# Patient Record
Sex: Female | Born: 1990 | Race: Black or African American | Hispanic: No | Marital: Single | Smoking: Former smoker
Health system: Southern US, Community
[De-identification: ages and names within clinical notes are randomized; demographics above are authoritative.]

## PROBLEM LIST (undated history)

## (undated) ENCOUNTER — Inpatient Hospital Stay (HOSPITAL_COMMUNITY): Payer: Self-pay

## (undated) HISTORY — PX: APPENDECTOMY: SHX54

## (undated) HISTORY — PX: WISDOM TOOTH EXTRACTION: SHX21

---
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
## 2014-08-26 ENCOUNTER — Emergency Department (HOSPITAL_COMMUNITY)
Admission: EM | Admit: 2014-08-26 | Discharge: 2014-08-26 | Disposition: A | Discharge status: Home / Self Care | Payer: Medicaid Other | Attending: Emergency Medicine | Admitting: Emergency Medicine

## 2014-08-26 ENCOUNTER — Encounter (HOSPITAL_COMMUNITY): Payer: Self-pay | Admitting: *Deleted

## 2014-08-26 DIAGNOSIS — S7012XA Contusion of left thigh, initial encounter: Secondary | ICD-10-CM

## 2014-08-26 DIAGNOSIS — Z720 Tobacco use: Secondary | ICD-10-CM | POA: Insufficient documentation

## 2014-08-26 DIAGNOSIS — Y92410 Unspecified street and highway as the place of occurrence of the external cause: Secondary | ICD-10-CM | POA: Insufficient documentation

## 2014-08-26 DIAGNOSIS — S80812A Abrasion, left lower leg, initial encounter: Secondary | ICD-10-CM | POA: Insufficient documentation

## 2014-08-26 DIAGNOSIS — Y9389 Activity, other specified: Secondary | ICD-10-CM | POA: Insufficient documentation

## 2014-08-26 DIAGNOSIS — Y998 Other external cause status: Secondary | ICD-10-CM | POA: Insufficient documentation

## 2014-08-26 DIAGNOSIS — S3992XA Unspecified injury of lower back, initial encounter: Secondary | ICD-10-CM | POA: Insufficient documentation

## 2014-08-26 MED ORDER — IBUPROFEN 800 MG PO TABS: 800.0000 mg | ORAL_TABLET | Freq: Three times a day (TID) | ORAL | Status: DC

## 2014-08-26 MED ORDER — MUPIROCIN CALCIUM 2 % EX CREA
TOPICAL_CREAM | Freq: Once | CUTANEOUS | Status: DC
  Filled 2014-08-26: qty 15

## 2014-08-26 MED ORDER — CYCLOBENZAPRINE HCL 10 MG PO TABS: 10.0000 mg | ORAL_TABLET | Freq: Two times a day (BID) | ORAL | Status: DC | PRN

## 2014-08-26 MED ORDER — BACITRACIN ZINC 500 UNIT/GM EX OINT
TOPICAL_OINTMENT | CUTANEOUS | Status: DC
Start: 2014-08-26 — End: 2014-08-27
  Filled 2014-08-26: qty 0.9
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
## 2015-08-23 ENCOUNTER — Encounter (HOSPITAL_COMMUNITY): Payer: Self-pay | Admitting: Emergency Medicine

## 2015-08-23 ENCOUNTER — Emergency Department (HOSPITAL_COMMUNITY)
Admission: EM | Admit: 2015-08-23 | Discharge: 2015-08-23 | Discharge status: Against Medical Advice | Payer: Medicaid Other | Attending: Emergency Medicine | Admitting: Emergency Medicine

## 2015-08-23 DIAGNOSIS — R55 Syncope and collapse: Secondary | ICD-10-CM | POA: Insufficient documentation

## 2015-08-23 DIAGNOSIS — R51 Headache: Secondary | ICD-10-CM | POA: Insufficient documentation

## 2015-08-23 DIAGNOSIS — F17210 Nicotine dependence, cigarettes, uncomplicated: Secondary | ICD-10-CM | POA: Insufficient documentation

## 2015-08-23 DIAGNOSIS — R110 Nausea: Secondary | ICD-10-CM | POA: Insufficient documentation

## 2015-08-23 LAB — CBC
HCT: 39.8 % (ref 36.0–46.0)
HEMOGLOBIN: 12.9 g/dL (ref 12.0–15.0)
MCH: 30.9 pg (ref 26.0–34.0)
MCHC: 32.4 g/dL (ref 30.0–36.0)
MCV: 95.4 fL (ref 78.0–100.0)
PLATELETS: 236 10*3/uL (ref 150–400)
RBC: 4.17 MIL/uL (ref 3.87–5.11)
RDW: 14.2 % (ref 11.5–15.5)
WBC: 10.1 10*3/uL (ref 4.0–10.5)

## 2015-08-23 LAB — BASIC METABOLIC PANEL
ANION GAP: 8 (ref 5–15)
BUN: 8 mg/dL (ref 6–20)
CALCIUM: 9.5 mg/dL (ref 8.9–10.3)
CHLORIDE: 107 mmol/L (ref 101–111)
CO2: 25 mmol/L (ref 22–32)
CREATININE: 0.8 mg/dL (ref 0.44–1.00)
GFR calc non Af Amer: >60 mL/min (ref >60)
Glucose, Bld: 90 mg/dL (ref 65–99)
Potassium: 3.5 mmol/L (ref 3.5–5.1)
SODIUM: 140 mmol/L (ref 135–145)

## 2015-08-23 LAB — CBG MONITORING, ED: GLUCOSE-CAPILLARY: 71 mg/dL (ref 65–99)

## 2015-08-23 NOTE — ED Notes (Signed)
Saturday night she was doing her hair when she fainted. States she hit her head on the patio, friend states she was passed out for around 1 minute. States she fainted again not too long afterwards. Has felt nauseous since. Today began having a bad headache, and states she feels weird like she did when she fainted over the weekend.

## 2015-08-23 NOTE — ED Notes (Signed)
Patient states to this writer she is leaving and will come back tomorrow.

## 2015-11-09 ENCOUNTER — Encounter (HOSPITAL_COMMUNITY): Payer: Self-pay | Admitting: Emergency Medicine

## 2015-11-09 DIAGNOSIS — R5383 Other fatigue: Secondary | ICD-10-CM | POA: Insufficient documentation

## 2015-11-09 DIAGNOSIS — F17210 Nicotine dependence, cigarettes, uncomplicated: Secondary | ICD-10-CM | POA: Insufficient documentation

## 2015-11-09 DIAGNOSIS — R112 Nausea with vomiting, unspecified: Secondary | ICD-10-CM | POA: Insufficient documentation

## 2015-11-09 NOTE — ED Notes (Signed)
Pt. reports nausea and emesis with fatigue onset this evening , denies fever or chills.

## 2015-11-10 ENCOUNTER — Emergency Department (HOSPITAL_BASED_OUTPATIENT_CLINIC_OR_DEPARTMENT_OTHER)
Admission: EM | Admit: 2015-11-10 | Discharge: 2015-11-10 | Disposition: A | Discharge status: Home / Self Care | Payer: Self-pay | Attending: Emergency Medicine | Admitting: Emergency Medicine

## 2015-11-10 ENCOUNTER — Emergency Department (HOSPITAL_BASED_OUTPATIENT_CLINIC_OR_DEPARTMENT_OTHER): Payer: Self-pay

## 2015-11-10 ENCOUNTER — Emergency Department (HOSPITAL_COMMUNITY)
Admission: EM | Admit: 2015-11-10 | Discharge: 2015-11-10 | Disposition: A | Discharge status: Home / Self Care | Payer: No Typology Code available for payment source | Attending: Emergency Medicine | Admitting: Emergency Medicine

## 2015-11-10 ENCOUNTER — Encounter (HOSPITAL_BASED_OUTPATIENT_CLINIC_OR_DEPARTMENT_OTHER): Payer: Self-pay | Admitting: *Deleted

## 2015-11-10 DIAGNOSIS — R1010 Upper abdominal pain, unspecified: Secondary | ICD-10-CM | POA: Insufficient documentation

## 2015-11-10 DIAGNOSIS — R1110 Vomiting, unspecified: Secondary | ICD-10-CM | POA: Insufficient documentation

## 2015-11-10 DIAGNOSIS — F17210 Nicotine dependence, cigarettes, uncomplicated: Secondary | ICD-10-CM | POA: Insufficient documentation

## 2015-11-10 LAB — COMPREHENSIVE METABOLIC PANEL
ALBUMIN: 4.1 g/dL (ref 3.5–5.0)
ALT: 13 U/L — ABNORMAL LOW (ref 14–54)
ANION GAP: 11 (ref 5–15)
AST: 25 U/L (ref 15–41)
Alkaline Phosphatase: 50 U/L (ref 38–126)
BUN: 7 mg/dL (ref 6–20)
CHLORIDE: 105 mmol/L (ref 101–111)
CO2: 25 mmol/L (ref 22–32)
Calcium: 9.6 mg/dL (ref 8.9–10.3)
Creatinine, Ser: 0.87 mg/dL (ref 0.44–1.00)
GFR calc Af Amer: >60 mL/min (ref >60)
GFR calc non Af Amer: >60 mL/min (ref >60)
GLUCOSE: 110 mg/dL — AB (ref 65–99)
POTASSIUM: 4.3 mmol/L (ref 3.5–5.1)
SODIUM: 141 mmol/L (ref 135–145)
Total Bilirubin: 1.1 mg/dL (ref 0.3–1.2)
Total Protein: 8.3 g/dL — ABNORMAL HIGH (ref 6.5–8.1)

## 2015-11-10 LAB — URINE MICROSCOPIC-ADD ON

## 2015-11-10 LAB — URINALYSIS, ROUTINE W REFLEX MICROSCOPIC
GLUCOSE, UA: NEGATIVE mg/dL
KETONES UR: 15 mg/dL — AB
NITRITE: NEGATIVE
PH: 5 (ref 5.0–8.0)
Protein, ur: 100 mg/dL — AB
SPECIFIC GRAVITY, URINE: 1.03 (ref 1.005–1.030)

## 2015-11-10 LAB — CBC
HEMATOCRIT: 43.7 % (ref 36.0–46.0)
HEMOGLOBIN: 13.9 g/dL (ref 12.0–15.0)
MCH: 30.6 pg (ref 26.0–34.0)
MCHC: 31.8 g/dL (ref 30.0–36.0)
MCV: 96.3 fL (ref 78.0–100.0)
Platelets: 236 10*3/uL (ref 150–400)
RBC: 4.54 MIL/uL (ref 3.87–5.11)
RDW: 14.3 % (ref 11.5–15.5)
WBC: 17.5 10*3/uL — ABNORMAL HIGH (ref 4.0–10.5)

## 2015-11-10 LAB — POC URINE PREG, ED: Preg Test, Ur: NEGATIVE

## 2015-11-10 LAB — LIPASE, BLOOD: Lipase: 12 U/L (ref 11–51)

## 2015-11-10 MED ORDER — METOCLOPRAMIDE HCL 10 MG PO TABS: 10.0000 mg | ORAL_TABLET | Freq: Four times a day (QID) | ORAL | Status: DC

## 2015-11-10 MED ORDER — ONDANSETRON 4 MG PO TBDP
4.0000 mg | ORAL_TABLET | Freq: Once | ORAL | Status: AC | PRN
  Administered 2015-11-10: 4 mg via ORAL

## 2015-11-10 MED ORDER — ONDANSETRON 4 MG PO TBDP
ORAL_TABLET | ORAL | Status: AC
  Filled 2015-11-10: qty 1

## 2015-11-10 MED ORDER — MORPHINE SULFATE (PF) 2 MG/ML IV SOLN
2.0000 mg | Freq: Once | INTRAVENOUS | Status: AC
  Administered 2015-11-10: 2 mg via INTRAVENOUS
  Filled 2015-11-10: qty 1

## 2015-11-10 MED ORDER — SODIUM CHLORIDE 0.9 % IV BOLUS (SEPSIS)
2000.0000 mL | Freq: Once | INTRAVENOUS | Status: AC
  Administered 2015-11-10: 2000 mL via INTRAVENOUS

## 2015-11-10 NOTE — ED Provider Notes (Signed)
CSN: [id number]     Arrival date & time 11/10/15  [id number] History   None    Chief Complaint  Patient presents with  . Abdominal Pain     (Consider location/radiation/quality/duration/timing/severity/associated sxs/prior Treatment) HPI Complains of epigastric pain onset 7:30 PM yesterday immediately after eating at [employer] accompanied by multiple episodes of vomiting. She was seen at [facility] emergency department last night, treated with antiemetics and released. Continues to complain of pain this morning though she is no longer nauseated. Denies hematemesis No fever. No other associated symptoms. Last bowel movement yesterday, normal. No urinary symptoms no vaginal discharge. Nothing makes pain better or worse. No nausea at present. History reviewed. No pertinent past medical history. Past medical history negative History reviewed. No pertinent past surgical history. History reviewed. No pertinent family history. Social History  Substance Use Topics  . Smoking status: Current Every Day Smoker -- 0.20 packs/day    Types: Cigarettes  . Smokeless tobacco: None  . Alcohol Use: Yes  Positive marijuana use OB History    No data available     Review of Systems  Constitutional: Negative.   HENT: Negative.   Respiratory: Negative.   Cardiovascular: Negative.   Gastrointestinal: Positive for vomiting and abdominal pain.  Musculoskeletal: Negative.   Skin: Negative.   Neurological: Negative.   Psychiatric/Behavioral: Negative.   All other systems reviewed and are negative.     Allergies  Tylenol  Home Medications   Prior to Admission medications   Medication Sig Start Date End Date Taking? Authorizing Provider  CAFFEINE CITRATE PO Take by mouth.    Historical Provider, MD  ibuprofen (ADVIL,MOTRIN) 200 MG tablet Take 200 mg by mouth every 6 (six) hours as needed.    Historical Provider, MD   BP 119/77 mmHg  Pulse 88  Temp(Src) 100.2 F (37.9 C) (Oral)  Resp 18  Ht  5\' 6"  (1.676 m)  Wt 113 lb (51.256 kg)  BMI 18.25 kg/m2  SpO2 98%  LMP 11/02/2015 (Approximate) Physical Exam  Constitutional: She appears well-developed and well-nourished.  HENT:  Head: Normocephalic and atraumatic.  Eyes: Conjunctivae are normal. Pupils are equal, round, and reactive to light.  Neck: Neck supple. No tracheal deviation present. No thyromegaly present.  Cardiovascular: Normal rate and regular rhythm.   No murmur heard. Pulmonary/Chest: Effort normal and breath sounds normal.  Abdominal: Soft. Bowel sounds are normal. She exhibits no distension and no mass. There is tenderness. There is no rebound and no guarding.  Tender at epigastrium and right upper quadrant  Musculoskeletal: Normal range of motion. She exhibits no edema or tenderness.  Neurological: She is alert. Coordination normal.  Skin: Skin is warm and dry. No rash noted.  Psychiatric: She has a normal mood and affect.  Nursing note and vitals reviewed.   ED Course  Procedures (including critical care time) Labs Review Labs Reviewed - No data to display  Imaging Review No results found. I have personally reviewed and evaluated these images and lab results as part of my medical decision-making.   EKG Interpretation None    9:40 AM patient asymptomatic after treatment with intravenous morphine, 2 mg and intravenous fluids administered. On reexamination abdomen is nontender. She's able to drink water without nausea or vomiting  Laboratory work from yesterday's ED visit reviewed  MDM  Plan clear liquid diet for the next 24 hours. Return if symptoms worsen. Prescription Reglan. Referral [facility] to get primary care physician. Abdominal pain is nonspecific at  present.  Final diagnoses:  None  Diagnosis #1 upper abdominal pain #2 nausea and vomiting      Orlie Dakin, MD 11/10/15 [phone]

## 2015-11-10 NOTE — ED Notes (Signed)
MD at bedside. 

## 2015-11-10 NOTE — ED Notes (Signed)
Patient states she does have someone to pick her up and she would like something for pain. MD updated.

## 2015-11-10 NOTE — ED Notes (Signed)
Patient is alert and oriented x3.  She was given DC instructions and follow up visit instructions.  Patient gave verbal understanding. She was DC ambulatory under her own power to home.  V/S stable.  He was not showing any signs of distress on DC 

## 2015-11-10 NOTE — ED Notes (Signed)
Patient arrived from home.  She is complaining of abdominal pain that started last night. She states that about 7:30pm after eating [employer] she started to feel nausea and vomiting With diarrhea.  Patient was seen at [facility] last night and states that she does not feel any better. Currently rates her pain 10 of 10.  No active vomiting noted

## 2015-11-10 NOTE — ED Notes (Signed)
Patient transported to ultrasound.

## 2015-11-10 NOTE — ED Notes (Signed)
Patient returned from US.

## 2015-11-10 NOTE — Discharge Instructions (Signed)
Abdominal Pain, Adult Stay on clear liquids such as apple juice, Jell-O, water or Gatorade or clear broth for the next 24 hours. Take the medication prescribed as needed for nausea or vomiting. Make sure that you drink at least six 8 ounce glasses of clear liquids each day to prevent dehydration. Return if you feel worse or are unable to hold down fluids after taking the medication prescribed. Call the [facility] to get a primary care physician. Many things can cause abdominal pain. Usually, abdominal pain is not caused by a disease and will improve without treatment. It can often be observed and treated at home. Your health care provider will do a physical exam and possibly order blood tests and X-rays to help determine the seriousness of your pain. However, in many cases, more time must pass before a clear cause of the pain can be found. Before that point, your health care provider may not know if you need more testing or further treatment. HOME CARE INSTRUCTIONS Monitor your abdominal pain for any changes. The following actions may help to alleviate any discomfort you are experiencing:  Only take over-the-counter or prescription medicines as directed by your health care provider.  Do not take laxatives unless directed to do so by your health care provider.  Try a clear liquid diet (broth, tea, or water) as directed by your health care provider. Slowly move to a bland diet as tolerated. SEEK MEDICAL CARE IF:  You have unexplained abdominal pain.  You have abdominal pain associated with nausea or diarrhea.  You have pain when you urinate or have a bowel movement.  You experience abdominal pain that wakes you in the night.  You have abdominal pain that is worsened or improved by eating food.  You have abdominal pain that is worsened with eating fatty foods.  You have a fever. SEEK IMMEDIATE MEDICAL CARE IF:  Your pain does not go away within 2 hours.  You  keep throwing up (vomiting).  Your pain is felt only in portions of the abdomen, such as the right side or the left lower portion of the abdomen.  You pass bloody or black tarry stools. MAKE SURE YOU:  Understand these instructions.  Will watch your condition.  Will get help right away if you are not doing well or get worse.   This information is not intended to replace advice given to you by your health care provider. Make sure you discuss any questions you have with your health care provider.   Document Released: 05/02/2005 Document Revised: 04/13/2015 Document Reviewed: 04/01/2013 Elsevier Interactive Patient Education [employer].

## 2016-07-17 ENCOUNTER — Inpatient Hospital Stay (HOSPITAL_COMMUNITY): Payer: Medicaid Other

## 2016-07-17 ENCOUNTER — Inpatient Hospital Stay (HOSPITAL_COMMUNITY)
Admission: AD | Admit: 2016-07-17 | Discharge: 2016-07-17 | Disposition: A | Discharge status: Home / Self Care | Payer: Medicaid Other | Source: Ambulatory Visit | Attending: Obstetrics and Gynecology | Admitting: Obstetrics and Gynecology

## 2016-07-17 ENCOUNTER — Encounter (HOSPITAL_COMMUNITY): Payer: Self-pay | Admitting: *Deleted

## 2016-07-17 DIAGNOSIS — R109 Unspecified abdominal pain: Secondary | ICD-10-CM | POA: Diagnosis not present

## 2016-07-17 DIAGNOSIS — Z87891 Personal history of nicotine dependence: Secondary | ICD-10-CM | POA: Diagnosis not present

## 2016-07-17 DIAGNOSIS — O26891 Other specified pregnancy related conditions, first trimester: Secondary | ICD-10-CM

## 2016-07-17 DIAGNOSIS — Z3A09 9 weeks gestation of pregnancy: Secondary | ICD-10-CM | POA: Insufficient documentation

## 2016-07-17 DIAGNOSIS — O219 Vomiting of pregnancy, unspecified: Secondary | ICD-10-CM

## 2016-07-17 DIAGNOSIS — O26899 Other specified pregnancy related conditions, unspecified trimester: Secondary | ICD-10-CM

## 2016-07-17 DIAGNOSIS — O210 Mild hyperemesis gravidarum: Secondary | ICD-10-CM | POA: Insufficient documentation

## 2016-07-17 DIAGNOSIS — Z3A01 Less than 8 weeks gestation of pregnancy: Secondary | ICD-10-CM

## 2016-07-17 DIAGNOSIS — Z3491 Encounter for supervision of normal pregnancy, unspecified, first trimester: Secondary | ICD-10-CM

## 2016-07-17 LAB — WET PREP, GENITAL
Sperm: NONE SEEN
TRICH WET PREP: NONE SEEN
Yeast Wet Prep HPF POC: NONE SEEN

## 2016-07-17 LAB — COMPREHENSIVE METABOLIC PANEL
ALT: 9 U/L — ABNORMAL LOW (ref 14–54)
AST: 15 U/L (ref 15–41)
Albumin: 3.8 g/dL (ref 3.5–5.0)
Alkaline Phosphatase: 46 U/L (ref 38–126)
Anion gap: 6 (ref 5–15)
BUN: 7 mg/dL (ref 6–20)
CO2: 24 mmol/L (ref 22–32)
Calcium: 9 mg/dL (ref 8.9–10.3)
Chloride: 101 mmol/L (ref 101–111)
Creatinine, Ser: 0.68 mg/dL (ref 0.44–1.00)
GFR calc Af Amer: >60 mL/min (ref >60)
GFR calc non Af Amer: >60 mL/min (ref >60)
Glucose, Bld: 92 mg/dL (ref 65–99)
Potassium: 3.9 mmol/L (ref 3.5–5.1)
Sodium: 131 mmol/L — ABNORMAL LOW (ref 135–145)
Total Bilirubin: 0.5 mg/dL (ref 0.3–1.2)
Total Protein: 7.3 g/dL (ref 6.5–8.1)

## 2016-07-17 LAB — CBC
HEMATOCRIT: 35.9 % — AB (ref 36.0–46.0)
HEMOGLOBIN: 12.2 g/dL (ref 12.0–15.0)
MCH: 31 pg (ref 26.0–34.0)
MCHC: 34 g/dL (ref 30.0–36.0)
MCV: 91.3 fL (ref 78.0–100.0)
Platelets: 245 10*3/uL (ref 150–400)
RBC: 3.93 MIL/uL (ref 3.87–5.11)
RDW: 14.3 % (ref 11.5–15.5)
WBC: 10.2 10*3/uL (ref 4.0–10.5)

## 2016-07-17 LAB — HCG, QUANTITATIVE, PREGNANCY: hCG, Beta Chain, Quant, S: 122310 m[IU]/mL — ABNORMAL HIGH (ref <5)

## 2016-07-17 LAB — URINALYSIS, ROUTINE W REFLEX MICROSCOPIC
Bilirubin Urine: NEGATIVE
GLUCOSE, UA: NEGATIVE mg/dL
Hgb urine dipstick: NEGATIVE
Ketones, ur: NEGATIVE mg/dL
LEUKOCYTES UA: NEGATIVE
Nitrite: NEGATIVE
PH: 5 (ref 5.0–8.0)
Protein, ur: NEGATIVE mg/dL
SPECIFIC GRAVITY, URINE: 1.024 (ref 1.005–1.030)

## 2016-07-17 LAB — ABO/RH: ABO/RH(D): B POS

## 2016-07-17 LAB — POCT PREGNANCY, URINE: Preg Test, Ur: POSITIVE — AB

## 2016-07-17 MED ORDER — METOCLOPRAMIDE HCL 10 MG PO TABS: 10.0000 mg | ORAL_TABLET | Freq: Four times a day (QID) | ORAL | 0 refills | Status: DC | PRN

## 2016-07-17 MED ORDER — METOCLOPRAMIDE HCL 10 MG PO TABS
10.0000 mg | ORAL_TABLET | Freq: Three times a day (TID) | ORAL | Status: DC | PRN
  Administered 2016-07-17: 10 mg via ORAL
  Filled 2016-07-17: qty 1

## 2016-07-17 NOTE — MAU Note (Signed)
Urine in lab 

## 2016-07-17 NOTE — MAU Provider Note (Signed)
History     CSN: [id number]  Arrival date and time: 07/17/16 0915   None     Chief Complaint  Patient presents with  . Abdominal Pain  . Nausea  . Possible Pregnancy   G2P0010 @[redacted]w[redacted]d  here with c/o intermittent N/V x2 weeks. She reports nausea daily and vomiting occasionally. She also c/o intermittent sharp pain in lower abdomen x2 weeks. She used Ibuprofen but unsure if she had relief since she was nauseated and vomiting at the time. She reports a thin white non-odorous vaginal discharge x2 weeks. No new partner in a year. She denies fevers. She denies urinary sx. Denies sick contacts.   OB History    Gravida Para Term Preterm AB Living   2       1 0   SAB TAB Ectopic Multiple Live Births     1     0      Past Medical History:  Diagnosis Date  . Medical history non-contributory     Past Surgical History:  Procedure Laterality Date  . WISDOM TOOTH EXTRACTION      History reviewed. No pertinent family history.  Social History  Substance Use Topics  . Smoking status: Former Smoker    Packs/day: 0.20    Types: Cigarettes, Cigars    Quit date: 12/16/2015  . Smokeless tobacco: Never Used  . Alcohol use No    Allergies: No Known Allergies  Prescriptions Prior to Admission  Medication Sig Dispense Refill Last Dose  . CAFFEINE CITRATE PO Take by mouth.   Unknown at Unknown time  . ibuprofen (ADVIL,MOTRIN) 200 MG tablet Take 200 mg by mouth every 6 (six) hours as needed.   Past Month at Unknown time  . metoCLOPramide (REGLAN) 10 MG tablet Take 1 tablet (10 mg total) by mouth every 6 (six) hours. 6 tablet 0     Review of Systems  Constitutional: Negative.   Gastrointestinal: Positive for abdominal pain, nausea and vomiting.  Genitourinary: Negative.    Physical Exam   Blood pressure 98/70, pulse 79, temperature 98.3 F (36.8 C), temperature source Oral, resp. rate 16, weight 53.1 kg (117 lb), last menstrual period 05/10/2016.  Physical Exam  Constitutional:  She is oriented to person, place, and time. She appears well-developed and well-nourished. No distress.  HENT:  Head: Normocephalic and atraumatic.  Neck: Normal range of motion. Neck supple.  Cardiovascular: Normal rate.   Respiratory: Effort normal.  GI: Soft. She exhibits no distension and no mass. There is no tenderness. There is no rebound and no guarding.  Genitourinary:  Genitourinary Comments: External: no lesions or erythema Vagina: rugated, nulli, white, thin, scant discharge Uterus: non enlarged, retroverted, non tender, no CMT Adnexae: no masses, no tenderness left, no tenderness right   Musculoskeletal: Normal range of motion.  Neurological: She is alert and oriented to person, place, and time.  Skin: Skin is warm and dry.   Results for orders placed or performed during the hospital encounter of 07/17/16 (from the past 24 hour(s))  Pregnancy, urine POC     Status: Abnormal   Collection Time: 07/17/16  9:53 AM  Result Value Ref Range   Preg Test, Ur POSITIVE (A) NEGATIVE  Urinalysis, Routine w reflex microscopic     Status: Abnormal   Collection Time: 07/17/16  9:54 AM  Result Value Ref Range   Color, Urine YELLOW YELLOW   APPearance HAZY (A) CLEAR   Specific Gravity, Urine 1.024 1.005 - 1.030   pH 5.0 5.0 -  8.0   Glucose, UA NEGATIVE NEGATIVE mg/dL   Hgb urine dipstick NEGATIVE NEGATIVE   Bilirubin Urine NEGATIVE NEGATIVE   Ketones, ur NEGATIVE NEGATIVE mg/dL   Protein, ur NEGATIVE NEGATIVE mg/dL   Nitrite NEGATIVE NEGATIVE   Leukocytes, UA NEGATIVE NEGATIVE  Wet prep, genital     Status: Abnormal   Collection Time: 07/17/16 12:31 PM  Result Value Ref Range   Yeast Wet Prep HPF POC NONE SEEN NONE SEEN   Trich, Wet Prep NONE SEEN NONE SEEN   Clue Cells Wet Prep HPF POC PRESENT (A) NONE SEEN   WBC, Wet Prep HPF POC FEW (A) NONE SEEN   Sperm NONE SEEN   CBC     Status: Abnormal   Collection Time: 07/17/16 12:35 PM  Result Value Ref Range   WBC 10.2 4.0 - 10.5  K/uL   RBC 3.93 3.87 - 5.11 MIL/uL   Hemoglobin 12.2 12.0 - 15.0 g/dL   HCT 35.9 (L) 36.0 - 46.0 %   MCV 91.3 78.0 - 100.0 fL   MCH 31.0 26.0 - 34.0 pg   MCHC 34.0 30.0 - 36.0 g/dL   RDW 14.3 11.5 - 15.5 %   Platelets 245 150 - 400 K/uL  Comprehensive metabolic panel     Status: Abnormal   Collection Time: 07/17/16 12:35 PM  Result Value Ref Range   Sodium 131 (L) 135 - 145 mmol/L   Potassium 3.9 3.5 - 5.1 mmol/L   Chloride 101 101 - 111 mmol/L   CO2 24 22 - 32 mmol/L   Glucose, Bld 92 65 - 99 mg/dL   BUN 7 6 - 20 mg/dL   Creatinine, Ser 0.68 0.44 - 1.00 mg/dL   Calcium 9.0 8.9 - 10.3 mg/dL   Total Protein 7.3 6.5 - 8.1 g/dL   Albumin 3.8 3.5 - 5.0 g/dL   AST 15 15 - 41 U/L   ALT 9 (L) 14 - 54 U/L   Alkaline Phosphatase 46 38 - 126 U/L   Total Bilirubin 0.5 0.3 - 1.2 mg/dL   GFR calc non Af Amer >60 >60 mL/min   GFR calc Af Amer >60 >60 mL/min   Anion gap 6 5 - 15  hCG, quantitative, pregnancy     Status: Abnormal   Collection Time: 07/17/16 12:35 PM  Result Value Ref Range   hCG, Beta Chain, Quant, S 122,310 (H) <5 mIU/mL  ABO/Rh     Status: None (Preliminary result)   Collection Time: 07/17/16 12:35 PM  Result Value Ref Range   ABO/RH(D) B POS     MAU Course  Procedures Reglan 10 mg po  MDM Labs and [place] ordered and reviewed. No episodes of emesis. No evidence of acute pelvic or abdominal process. Normal SIUP at [redacted]w[redacted]d not consistent with LMP. Stable for discharge home.  Assessment and Plan   1. Nausea/vomiting in pregnancy   2. Abdominal pain affecting pregnancy   3. Normal intrauterine pregnancy on prenatal ultrasound in first trimester    Discharge home Follow up in 2-3 weeks with OBGYN provider of choice Return for worsening sx    Medication List    STOP taking these medications   CAFFEINE CITRATE PO   ibuprofen 200 MG tablet Commonly known as:  ADVIL,MOTRIN     TAKE these medications   metoCLOPramide 10 MG tablet Commonly known as:  REGLAN Take  1 tablet (10 mg total) by mouth every 6 (six) hours as needed for nausea. What changed:  when to take  this  reasons to take this      Julianne Handler, CNM 07/17/2016, 12:24 PM

## 2016-07-17 NOTE — MAU Note (Signed)
Pt had gingerale with her that she was sipping on .  Did not throw up while waiting in lobby

## 2016-07-17 NOTE — MAU Note (Signed)
Last few weeks has been feeling sick, has been nauseous.  Some vomiting.  Stools have been loose. Stomach cramps, sharp pains.  One neg and one pos preg test, did them last week. No period in Nov. ( has been eating ribs, pizza, sausage, hot sauce.Marland KitchenMarland KitchenMarland Kitchen)

## 2016-07-17 NOTE — Discharge Instructions (Signed)

## 2016-07-18 LAB — GC/CHLAMYDIA PROBE AMP (~~LOC~~) NOT AT ARMC
CHLAMYDIA, DNA PROBE: NEGATIVE
Neisseria Gonorrhea: NEGATIVE

## 2016-08-06 NOTE — L&D Delivery Note (Signed)
Patient is a 26 y/o G2P1011 at [redacted]w[redacted]d here for SOL, SROM@1450  with moderate meconium  Delivery Note At 3:07 PM a viable female was delivered via Vaginal, Spontaneous Delivery (Presentation: OA).  APGAR: 9, 9; weight 7 lb 7.1 oz (3375 g).   Placenta status: Intact  Cord: 3v with the following complications: postpartum hemorrhage.  Cord pH: Not obtained  Patient arrived to floor with a complete cervix, ruptured with moderate meconium. Patient pushed for approximated 10 minutes before delivering a viable female infant that was crying and vigorous.  10U of IM pitocin was given following delivery of the neonate. The placenta was delivered with gentle traction, intact, after which significant bleeding was appreciated.  The patient's fundus was boggy and she was given 1037mcg of cytotec per rectum and 0.2mg  methergine IM along with fundal massage while IV access was established.  The patient was given a bolus of pitocin through IV and was noted to be firm.  A second degree laceration in the posterior perineum was repaired using 1% lidocaine with 3.0 vicryl and noted to be hemostatic.  Anesthesia: None for delivery. 1% lidocaine for laceration repair. Episiotomy:  None Lacerations:  Second degree, posterior Suture Repair: 3.0 vicryl Est. Blood Loss (mL): 700  Mom to postpartum.  Baby to Couplet care / Skin to Skin.  Fransisca Kaufmann Christian 02/25/2017, 4:09 PM  OB FELLOW DELIVERY ATTESTATION  I was gloved and present for the delivery.. Dr. Kennon Rounds was present and assisted with laceration repair. I agree with the above resident's note.    Gailen Shelter, MD OB Fellow 8:30 PM

## 2016-08-14 ENCOUNTER — Encounter: Payer: Self-pay | Admitting: Medical

## 2016-08-14 ENCOUNTER — Ambulatory Visit (INDEPENDENT_AMBULATORY_CARE_PROVIDER_SITE_OTHER): Payer: Self-pay | Admitting: Medical

## 2016-08-14 DIAGNOSIS — Z3401 Encounter for supervision of normal first pregnancy, first trimester: Secondary | ICD-10-CM

## 2016-08-14 DIAGNOSIS — Z3403 Encounter for supervision of normal first pregnancy, third trimester: Secondary | ICD-10-CM | POA: Insufficient documentation

## 2016-08-14 NOTE — Patient Instructions (Signed)

## 2016-08-14 NOTE — Progress Notes (Signed)
Decline Flu

## 2016-08-14 NOTE — Progress Notes (Signed)
   PRENATAL VISIT NOTE  Subjective:  Tricia Campbell is a 26 y.o. G2P0010 at [redacted]w[redacted]d being seen today for her initial prenatal care visit.  She is currently monitored for the following issues for this low-risk pregnancy and has Supervision of normal pregnancy in first trimester on her problem list.  Patient reports no complaints.   . Vag. Bleeding: None.  Movement: Absent. Denies leaking of fluid.   The following portions of the patient's history were reviewed and updated as appropriate: allergies, current medications, past family history, past medical history, past social history, past surgical history and problem list. Problem list updated.  Objective:   Vitals:   08/14/16 0919  BP: 109/68  Pulse: 62  Weight: 112 lb 8 oz (51 kg)    Fetal Status: Fetal Heart Rate (bpm): 172   Movement: Absent     General:  Alert, oriented and cooperative. Patient is in no acute distress.  Skin: Skin is warm and dry. No rash noted.   Cardiovascular: Normal heart rate noted  Respiratory: Normal respiratory effort, no problems with respiration noted  Abdomen: Soft, gravid, appropriate for gestational age. Pain/Pressure: Absent     Pelvic:  Cervical exam deferred        Extremities: Normal range of motion.     Mental Status: Normal mood and affect. Normal behavior. Normal judgment and thought content.   Assessment and Plan:  Pregnancy: G2P0010 at [redacted]w[redacted]d  1. Encounter for supervision of normal first pregnancy in first trimester - Patient no longer experiencing N/V and no longer requiring anti-emetics at this time - Prenatal Profile - Pain Mgmt, Profile 6 Conf w/o mM, U - Culture, OB Urine  first trimester warning signs and general obstetric precautions including but not limited to vaginal bleeding, contractions, leaking of fluid and fetal movement were reviewed in detail with the patient. Please refer to After Visit Summary for other counseling recommendations.  Return in about 4 weeks  (around 09/11/2016) for LOB.   Luvenia Redden, PA-C

## 2016-08-15 LAB — PRENATAL PROFILE (SOLSTAS)
Antibody Screen: NEGATIVE
BASOS PCT: 0 %
Basophils Absolute: 0 cells/uL (ref 0–200)
EOS ABS: 96 {cells}/uL (ref 15–500)
Eosinophils Relative: 1 %
HCT: 38.4 % (ref 35.0–45.0)
HEP B S AG: NEGATIVE
HIV: NONREACTIVE
Hemoglobin: 12.5 g/dL (ref 11.7–15.5)
LYMPHS ABS: 1728 {cells}/uL (ref 850–3900)
LYMPHS PCT: 18 %
MCH: 30.9 pg (ref 27.0–33.0)
MCHC: 32.6 g/dL (ref 32.0–36.0)
MCV: 95 fL (ref 80.0–100.0)
MPV: 10.7 fL (ref 7.5–12.5)
Monocytes Absolute: 672 cells/uL (ref 200–950)
Monocytes Relative: 7 %
NEUTROS PCT: 74 %
Neutro Abs: 7104 cells/uL (ref 1500–7800)
PLATELETS: 265 10*3/uL (ref 140–400)
RBC: 4.04 MIL/uL (ref 3.80–5.10)
RDW: 14.5 % (ref 11.0–15.0)
RH TYPE: POSITIVE
Rubella: 3.66 Index — ABNORMAL HIGH (ref <0.90)
WBC: 9.6 10*3/uL (ref 3.8–10.8)

## 2016-08-15 LAB — CULTURE, OB URINE: Organism ID, Bacteria: NO GROWTH

## 2016-08-16 LAB — PAIN MGMT, PROFILE 6 CONF W/O MM, U
6 Acetylmorphine: NEGATIVE ng/mL (ref <10)
ALCOHOL METABOLITES: NEGATIVE ng/mL (ref <500)
Amphetamines: NEGATIVE ng/mL (ref <500)
BARBITURATES: NEGATIVE ng/mL (ref <300)
BENZODIAZEPINES: NEGATIVE ng/mL (ref <100)
COCAINE METABOLITE: NEGATIVE ng/mL (ref <150)
CREATININE: 125.5 mg/dL (ref >=20.0)
MARIJUANA METABOLITE: POSITIVE ng/mL — AB (ref <20)
METHADONE METABOLITE: NEGATIVE ng/mL (ref <100)
Marijuana Metabolite: 186 ng/mL — ABNORMAL HIGH (ref <5)
OPIATES: NEGATIVE ng/mL (ref <100)
OXYCODONE: NEGATIVE ng/mL (ref <100)
Oxidant: NEGATIVE ug/mL (ref <200)
PH: 6.98 (ref 4.5–9.0)
PHENCYCLIDINE: NEGATIVE ng/mL (ref <25)
Please note:: 0

## 2016-08-17 ENCOUNTER — Encounter: Payer: Self-pay | Admitting: Medical

## 2016-08-17 DIAGNOSIS — F1291 Cannabis use, unspecified, in remission: Secondary | ICD-10-CM | POA: Insufficient documentation

## 2016-08-17 DIAGNOSIS — F1290 Cannabis use, unspecified, uncomplicated: Secondary | ICD-10-CM | POA: Insufficient documentation

## 2016-08-17 HISTORY — DX: Cannabis use, unspecified, in remission: F12.91

## 2016-08-26 ENCOUNTER — Emergency Department (HOSPITAL_BASED_OUTPATIENT_CLINIC_OR_DEPARTMENT_OTHER): Admission: EM | Admit: 2016-08-26 | Discharge: 2016-08-26 | Discharge status: Against Medical Advice | Payer: Self-pay

## 2016-08-26 NOTE — ED Triage Notes (Signed)
Called to triage, no answer x2.

## 2016-08-26 NOTE — ED Triage Notes (Signed)
Called to triage, no answer x1

## 2016-09-11 ENCOUNTER — Ambulatory Visit (INDEPENDENT_AMBULATORY_CARE_PROVIDER_SITE_OTHER): Payer: Medicaid Other | Admitting: Medical

## 2016-09-11 VITALS — BP 112/69 | HR 77 | Wt 115.1 lb

## 2016-09-11 DIAGNOSIS — Z3401 Encounter for supervision of normal first pregnancy, first trimester: Secondary | ICD-10-CM

## 2016-09-11 DIAGNOSIS — Z3402 Encounter for supervision of normal first pregnancy, second trimester: Secondary | ICD-10-CM

## 2016-09-11 NOTE — Patient Instructions (Signed)
Second Trimester of Pregnancy The second trimester is from week 13 through week 28, month 4 through 6. This is often the time in pregnancy that you feel your best. Often times, morning sickness has lessened or quit. You may have more energy, and you may get hungry more often. Your unborn baby (fetus) is growing rapidly. At the end of the sixth month, he or she is about 9 inches long and weighs about 1 pounds. You will likely feel the baby move (quickening) between 18 and 20 weeks of pregnancy. Follow these instructions at home:  Avoid all smoking, herbs, and alcohol. Avoid drugs not approved by your doctor.  Do not use any tobacco products, including cigarettes, chewing tobacco, and electronic cigarettes. If you need help quitting, ask your doctor. You may get counseling or other support to help you quit.  Only take medicine as told by your doctor. Some medicines are safe and some are not during pregnancy.  Exercise only as told by your doctor. Stop exercising if you start having cramps.  Eat regular, healthy meals.  Wear a good support bra if your breasts are tender.  Do not use hot tubs, steam rooms, or saunas.  Wear your seat belt when driving.  Avoid raw meat, uncooked cheese, and liter boxes and soil used by cats.  Take your prenatal vitamins.  Take 1500-2000 milligrams of calcium daily starting at the 20th week of pregnancy until you deliver your baby.  Try taking medicine that helps you poop (stool softener) as needed, and if your doctor approves. Eat more fiber by eating fresh fruit, vegetables, and whole grains. Drink enough fluids to keep your pee (urine) clear or pale yellow.  Take warm water baths (sitz baths) to soothe pain or discomfort caused by hemorrhoids. Use hemorrhoid cream if your doctor approves.  If you have puffy, bulging veins (varicose veins), wear support hose. Raise (elevate) your feet for 15 minutes, 3-4 times a day. Limit salt in your diet.  Avoid heavy  lifting, wear low heals, and sit up straight.  Rest with your legs raised if you have leg cramps or low back pain.  Visit your dentist if you have not gone during your pregnancy. Use a soft toothbrush to brush your teeth. Be gentle when you floss.  You can have sex (intercourse) unless your doctor tells you not to.  Go to your doctor visits. Get help if:  You feel dizzy.  You have mild cramps or pressure in your lower belly (abdomen).  You have a nagging pain in your belly area.  You continue to feel sick to your stomach (nauseous), throw up (vomit), or have watery poop (diarrhea).  You have bad smelling fluid coming from your vagina.  You have pain with peeing (urination). Get help right away if:  You have a fever.  You are leaking fluid from your vagina.  You have spotting or bleeding from your vagina.  You have severe belly cramping or pain.  You lose or gain weight rapidly.  You have trouble catching your breath and have chest pain.  You notice sudden or extreme puffiness (swelling) of your face, hands, ankles, feet, or legs.  You have not felt the baby move in over an hour.  You have severe headaches that do not go away with medicine.  You have vision changes. This information is not intended to replace advice given to you by your health care provider. Make sure you discuss any questions you have with your health care   provider. Document Released: 10/17/2009 Document Revised: 12/29/2015 Document Reviewed: 09/23/2012 Elsevier Interactive Patient Education  2017 [employer]  

## 2016-09-11 NOTE — Progress Notes (Signed)
   PRENATAL VISIT NOTE  Subjective:  Tricia Campbell is a 26 y.o. G2P0010 at [redacted]w[redacted]d being seen today for ongoing prenatal care.  She is currently monitored for the following issues for this low-risk pregnancy and has Supervision of normal pregnancy in first trimester and Marijuana use on her problem list.  Patient reports no complaints.  Contractions: Not present. Vag. Bleeding: None.  Movement: Absent. Denies leaking of fluid.   The following portions of the patient's history were reviewed and updated as appropriate: allergies, current medications, past family history, past medical history, past social history, past surgical history and problem list. Problem list updated.  Objective:   Vitals:   09/11/16 1103  BP: 112/69  Pulse: 77  Weight: 115 lb 1.6 oz (52.2 kg)    Fetal Status: Fetal Heart Rate (bpm): 164   Movement: Absent     General:  Alert, oriented and cooperative. Patient is in no acute distress.  Skin: Skin is warm and dry. No rash noted.   Cardiovascular: Normal heart rate noted  Respiratory: Normal respiratory effort, no problems with respiration noted  Abdomen: Soft, gravid, appropriate for gestational age. Pain/Pressure: Present     Pelvic:  Cervical exam deferred        Extremities: Normal range of motion.  Edema: None  Mental Status: Normal mood and affect. Normal behavior. Normal judgment and thought content.   Assessment and Plan:  Pregnancy: G2P0010 at [redacted]w[redacted]d  1. Encounter for supervision of normal first pregnancy in second trimester - AFP, Quad Screen - [place] MFM OB COMP + 47 WK; scheduled    second trimester warning signs and general obstetric precautions including but not limited to vaginal bleeding, contractions, leaking of fluid and fetal movement were reviewed in detail with the patient. Please refer to After Visit Summary for other counseling recommendations.  Return in about 4 weeks (around 10/09/2016) for LOB.   Luvenia Redden, PA-C

## 2016-09-13 LAB — AFP, QUAD SCREEN
AFP: 39.1 ng/mL
CURR GEST AGE: 15.4 wk
Down Syndrome Scr Risk Est: 1:3010 {titer}
HCG, Total: 63.6 IU/mL
INH: 327.1 pg/mL
Interpretation-AFP: NEGATIVE
MOM FOR INH: 1.65
MoM for AFP: 0.96
MoM for hCG: 1.05
OPEN SPINA BIFIDA: NEGATIVE
Osb Risk: 1:30100 {titer}
TRI 18 SCR RISK EST: NEGATIVE
UE3 MOM: 0.89
uE3 Value: 0.6 ng/mL

## 2016-09-24 ENCOUNTER — Encounter (HOSPITAL_COMMUNITY): Payer: Self-pay | Admitting: *Deleted

## 2016-09-24 ENCOUNTER — Inpatient Hospital Stay (HOSPITAL_COMMUNITY)
Admission: AD | Admit: 2016-09-24 | Discharge: 2016-09-24 | Disposition: A | Discharge status: Home / Self Care | Payer: Medicaid Other | Source: Ambulatory Visit | Attending: Obstetrics & Gynecology | Admitting: Obstetrics & Gynecology

## 2016-09-24 DIAGNOSIS — R51 Headache: Secondary | ICD-10-CM | POA: Insufficient documentation

## 2016-09-24 DIAGNOSIS — O26892 Other specified pregnancy related conditions, second trimester: Secondary | ICD-10-CM

## 2016-09-24 DIAGNOSIS — Z3A17 17 weeks gestation of pregnancy: Secondary | ICD-10-CM | POA: Insufficient documentation

## 2016-09-24 DIAGNOSIS — Z87891 Personal history of nicotine dependence: Secondary | ICD-10-CM | POA: Diagnosis not present

## 2016-09-24 LAB — URINALYSIS, ROUTINE W REFLEX MICROSCOPIC
Bilirubin Urine: NEGATIVE
Glucose, UA: NEGATIVE mg/dL
Hgb urine dipstick: NEGATIVE
Ketones, ur: NEGATIVE mg/dL
Leukocytes, UA: NEGATIVE
NITRITE: NEGATIVE
PH: 7 (ref 5.0–8.0)
Protein, ur: NEGATIVE mg/dL
SPECIFIC GRAVITY, URINE: 1.014 (ref 1.005–1.030)

## 2016-09-24 MED ORDER — DEXAMETHASONE SODIUM PHOSPHATE 10 MG/ML IJ SOLN
10.0000 mg | Freq: Once | INTRAMUSCULAR | Status: AC
  Administered 2016-09-24: 10 mg via INTRAVENOUS
  Filled 2016-09-24: qty 1

## 2016-09-24 MED ORDER — BUTALBITAL-APAP-CAFFEINE 50-325-40 MG PO TABS: 1.0000 | ORAL_TABLET | Freq: Four times a day (QID) | ORAL | 1 refills | Status: DC | PRN

## 2016-09-24 MED ORDER — METOCLOPRAMIDE HCL 5 MG/ML IJ SOLN: 10.0000 mg | Freq: Once | INTRAMUSCULAR | Status: DC

## 2016-09-24 MED ORDER — DIPHENHYDRAMINE HCL 50 MG/ML IJ SOLN: 25.0000 mg | Freq: Once | INTRAMUSCULAR | Status: DC

## 2016-09-24 MED ORDER — SODIUM CHLORIDE 0.9 % IV BOLUS (SEPSIS): 1000.0000 mL | Freq: Once | INTRAVENOUS | Status: DC

## 2016-09-24 MED ORDER — METOCLOPRAMIDE HCL 5 MG/ML IJ SOLN
10.0000 mg | Freq: Once | INTRAMUSCULAR | Status: AC
  Administered 2016-09-24: 10 mg via INTRAVENOUS
  Filled 2016-09-24: qty 2

## 2016-09-24 MED ORDER — DEXAMETHASONE SODIUM PHOSPHATE 10 MG/ML IJ SOLN: 10.0000 mg | Freq: Once | INTRAMUSCULAR | Status: DC

## 2016-09-24 MED ORDER — DIPHENHYDRAMINE HCL 50 MG/ML IJ SOLN
25.0000 mg | Freq: Once | INTRAMUSCULAR | Status: AC
  Administered 2016-09-24: 25 mg via INTRAVENOUS
  Filled 2016-09-24: qty 1

## 2016-09-24 MED ORDER — LACTATED RINGERS IV BOLUS (SEPSIS)
1000.0000 mL | Freq: Once | INTRAVENOUS | Status: AC
  Administered 2016-09-24: 1000 mL via INTRAVENOUS

## 2016-09-24 NOTE — MAU Provider Note (Signed)
Chief Complaint:  Headache   First Provider Initiated Contact with Patient 09/24/16 1110      HPI: Tricia Campbell is a 26 y.o. G2P0010 at [redacted]w[redacted]d who presents to maternity admissions reporting daily h/a not resolved by Tylenol.This is a new symptom starting a few weeks ago. She reports daily frontal h/a, constant but increasing and decreasing at times, and it is not reduced by Tylenol 500 mg which she has tried a few times.  It is associated with visual changes that occur only when the h/a is severe, and resolve when the h/a is less painful. She believes the h/a is worse after using her computer at work. She is drinking water daily but reports she may not be drinking enough. She reports good fetal movement, denies LOF, vaginal bleeding, vaginal itching/burning, urinary symptoms, h/a, dizziness, n/v, or fever/chills.    HPI  Past Medical History: Past Medical History:  Diagnosis Date  . Medical history non-contributory     Past obstetric history: OB History  Gravida Para Term Preterm AB Living  2       1 0  SAB TAB Ectopic Multiple Live Births    1     0    # Outcome Date GA Lbr Len/2nd Weight Sex Delivery Anes PTL Lv  2 Current           1 TAB               Past Surgical History: Past Surgical History:  Procedure Laterality Date  . WISDOM TOOTH EXTRACTION      Family History: Family History  Problem Relation Age of Onset  . Diabetes Maternal Grandmother     Social History: Social History  Substance Use Topics  . Smoking status: Former Smoker    Packs/day: 0.20    Types: Cigarettes, Cigars    Quit date: 12/16/2015  . Smokeless tobacco: Never Used  . Alcohol use No    Allergies: No Known Allergies  Meds:  Prescriptions Prior to Admission  Medication Sig Dispense Refill Last Dose  . acetaminophen (TYLENOL) 500 MG tablet Take 500 mg by mouth every 6 (six) hours as needed for mild pain.   09/23/2016 at Unknown time    ROS:  Review of Systems   Constitutional: Negative for chills, fatigue and fever.  Respiratory: Negative for shortness of breath.   Cardiovascular: Negative for chest pain.  Gastrointestinal: Negative for nausea and vomiting.  Genitourinary: Negative for difficulty urinating, dysuria, flank pain, pelvic pain, vaginal bleeding, vaginal discharge and vaginal pain.  Neurological: Positive for headaches. Negative for dizziness.  Psychiatric/Behavioral: Negative.      I have reviewed patient's Past Medical Hx, Surgical Hx, Family Hx, Social Hx, medications and allergies.   Physical Exam  Patient Vitals for the past 24 hrs:  BP Temp Temp src Pulse Resp  09/24/16 1237 (!) 90/43 - - 78 16  09/24/16 1014 106/70 98.1 F (36.7 C) Oral 91 16   Constitutional: Well-developed, well-nourished female in no acute distress.  Cardiovascular: normal rate Respiratory: normal effort GI: Abd soft, non-tender, gravid appropriate for gestational age.  MS: Extremities nontender, no edema, normal ROM Neurological - alert, oriented, normal speech, no focal findings or movement disorder noted, screening mental status exam normal, cranial nerves II through XII intact, DTR's normal and symmetric, motor and sensory grossly normal bilaterally, normal muscle tone, no tremors, strength 5/5 GU: Neg CVAT.    FHT:  154 by doppler   Labs: Results for orders placed or performed  during the hospital encounter of 09/24/16 (from the past 24 hour(s))  Urinalysis, Routine w reflex microscopic     Status: Abnormal   Collection Time: 09/24/16  9:58 AM  Result Value Ref Range   Color, Urine YELLOW YELLOW   APPearance HAZY (A) CLEAR   Specific Gravity, Urine 1.014 1.005 - 1.030   pH 7.0 5.0 - 8.0   Glucose, UA NEGATIVE NEGATIVE mg/dL   Hgb urine dipstick NEGATIVE NEGATIVE   Bilirubin Urine NEGATIVE NEGATIVE   Ketones, ur NEGATIVE NEGATIVE mg/dL   Protein, ur NEGATIVE NEGATIVE mg/dL   Nitrite NEGATIVE NEGATIVE   Leukocytes, UA NEGATIVE NEGATIVE    B/POS/-- ([birth year] [id number])  Imaging:  No results found.  MAU Course/MDM: I have ordered labs and reviewed results.  Headache cocktail given in MAU (LR x 1000 ml, Reglan 10 mg IV, Benadryl 25 mg IV, and Decadron 10 mg IV) with complete resolution of pt h/a With normal neuro exam, no associated n/v or light sensitivity, no evidence of migraine today.  Possible tension h/a or related to mild dehydration.  Could also be r/t vision changes so pt should f/u with opthalmology. D/C home today, increase PO fluids and Rx for Fioricet 1-2 tabs Q 6 hours PRN Keep scheduled appt with [facility], if h/a persist, consider referral to Allie Dimmer for headache evaluation at [facility] office Pt stable at time of discharge.   Assessment: 1. Headache in pregnancy, antepartum, second trimester     Plan: Discharge home  [place] for [place] Follow up.   Specialty:  Obstetrics and Gynecology Why:  On 10/09/16 as scheduled, or call sooner if headaches continue or worsen. Return to MAU as needed for emergencies. Contact information: [place] [phone]         Allergies as of 09/24/2016   No Known Allergies     Medication List    STOP taking these medications   acetaminophen 500 MG tablet Commonly known as:  TYLENOL     TAKE these medications   butalbital-acetaminophen-caffeine 50-325-40 MG tablet Commonly known as:  FIORICET, ESGIC Take 1-2 tablets by mouth every 6 (six) hours as needed for headache.       Fatima Blank Certified Nurse-Midwife 09/24/2016 12:42 PM

## 2016-10-09 ENCOUNTER — Ambulatory Visit (INDEPENDENT_AMBULATORY_CARE_PROVIDER_SITE_OTHER): Payer: Medicaid Other | Admitting: Obstetrics and Gynecology

## 2016-10-09 ENCOUNTER — Ambulatory Visit (HOSPITAL_COMMUNITY)
Admission: RE | Admit: 2016-10-09 | Discharge: 2016-10-09 | Disposition: A | Discharge status: Home / Self Care | Payer: Medicaid Other | Source: Ambulatory Visit | Attending: Medical | Admitting: Medical

## 2016-10-09 DIAGNOSIS — Z3A19 19 weeks gestation of pregnancy: Secondary | ICD-10-CM | POA: Diagnosis not present

## 2016-10-09 DIAGNOSIS — Z3482 Encounter for supervision of other normal pregnancy, second trimester: Secondary | ICD-10-CM

## 2016-10-09 DIAGNOSIS — Z3401 Encounter for supervision of normal first pregnancy, first trimester: Secondary | ICD-10-CM

## 2016-10-09 DIAGNOSIS — Z3689 Encounter for other specified antenatal screening: Secondary | ICD-10-CM | POA: Insufficient documentation

## 2016-10-09 DIAGNOSIS — Z3402 Encounter for supervision of normal first pregnancy, second trimester: Secondary | ICD-10-CM

## 2016-10-09 NOTE — Patient Instructions (Signed)

## 2016-10-09 NOTE — Progress Notes (Signed)
   PRENATAL VISIT NOTE  Subjective:  Tricia Campbell is a 26 y.o. G2P0010 at [redacted]w[redacted]d being seen today for ongoing prenatal care.  She is currently monitored for the following issues for this low-risk pregnancy and has Supervision of normal pregnancy in first trimester and Marijuana use on her problem list.  Patient reports no complaints.  Contractions: Not present. Vag. Bleeding: None.  Movement: Present. Denies leaking of fluid.   The following portions of the patient's history were reviewed and updated as appropriate: allergies, current medications, past family history, past medical history, past social history, past surgical history and problem list. Problem list updated.  Objective:   Vitals:   10/09/16 1100  BP: 104/72  Pulse: 64  Weight: 122 lb 3.2 oz (55.4 kg)    Fetal Status: Fetal Heart Rate (bpm): [place]   Movement: Present     General:  Alert, oriented and cooperative. Patient is in no acute distress.  Skin: Skin is warm and dry. No rash noted.   Cardiovascular: Normal heart rate noted  Respiratory: Normal respiratory effort, no problems with respiration noted  Abdomen: Soft, gravid, appropriate for gestational age. Pain/Pressure: Present     Pelvic:  Cervical exam deferred        Extremities: Normal range of motion.  Edema: None  Mental Status: Normal mood and affect. Normal behavior. Normal judgment and thought content.   Assessment and Plan:  Pregnancy: G2P0010 at [redacted]w[redacted]d  1. Encounter for supervision of normal first pregnancy in first trimester -anatomy scan today waiting results female fetus. -no other concerns -return in 4 wks.  Preterm labor symptoms and general obstetric precautions including but not limited to vaginal bleeding, contractions, leaking of fluid and fetal movement were reviewed in detail with the patient. Please refer to After Visit Summary for other counseling recommendations.  Return in about 4 weeks (around 11/06/2016) for LOB.   Waldemar Dickens, MD

## 2016-10-24 ENCOUNTER — Encounter (HOSPITAL_COMMUNITY): Payer: Self-pay

## 2016-10-24 ENCOUNTER — Inpatient Hospital Stay (HOSPITAL_COMMUNITY)
Admission: AD | Admit: 2016-10-24 | Discharge: 2016-10-24 | Disposition: A | Discharge status: Home / Self Care | Payer: Medicaid Other | Source: Ambulatory Visit | Attending: Obstetrics and Gynecology | Admitting: Obstetrics and Gynecology

## 2016-10-24 DIAGNOSIS — N760 Acute vaginitis: Secondary | ICD-10-CM | POA: Insufficient documentation

## 2016-10-24 DIAGNOSIS — Z3A21 21 weeks gestation of pregnancy: Secondary | ICD-10-CM | POA: Insufficient documentation

## 2016-10-24 DIAGNOSIS — Z833 Family history of diabetes mellitus: Secondary | ICD-10-CM | POA: Diagnosis not present

## 2016-10-24 DIAGNOSIS — B9689 Other specified bacterial agents as the cause of diseases classified elsewhere: Secondary | ICD-10-CM | POA: Diagnosis not present

## 2016-10-24 DIAGNOSIS — O26892 Other specified pregnancy related conditions, second trimester: Secondary | ICD-10-CM | POA: Diagnosis not present

## 2016-10-24 DIAGNOSIS — R109 Unspecified abdominal pain: Secondary | ICD-10-CM | POA: Diagnosis present

## 2016-10-24 DIAGNOSIS — R4589 Other symptoms and signs involving emotional state: Secondary | ICD-10-CM

## 2016-10-24 DIAGNOSIS — R4582 Worries: Secondary | ICD-10-CM

## 2016-10-24 DIAGNOSIS — Z87891 Personal history of nicotine dependence: Secondary | ICD-10-CM | POA: Diagnosis not present

## 2016-10-24 LAB — URINALYSIS, ROUTINE W REFLEX MICROSCOPIC
Bacteria, UA: NONE SEEN
Bilirubin Urine: NEGATIVE
GLUCOSE, UA: NEGATIVE mg/dL
Hgb urine dipstick: NEGATIVE
Ketones, ur: NEGATIVE mg/dL
Nitrite: NEGATIVE
PH: 6 (ref 5.0–8.0)
Protein, ur: NEGATIVE mg/dL
SPECIFIC GRAVITY, URINE: 1.021 (ref 1.005–1.030)

## 2016-10-24 LAB — WET PREP, GENITAL
SPERM: NONE SEEN
TRICH WET PREP: NONE SEEN
Yeast Wet Prep HPF POC: NONE SEEN

## 2016-10-24 MED ORDER — METRONIDAZOLE 500 MG PO TABS: 500.0000 mg | ORAL_TABLET | Freq: Two times a day (BID) | ORAL | 0 refills | Status: DC

## 2016-10-24 NOTE — MAU Provider Note (Signed)
History     CSN: [id number]  Arrival date and time: 10/24/16 1757   First Provider Initiated Contact with Patient 10/24/16 1839      Chief Complaint  Patient presents with  . Abdominal Cramping   HPI   Ms.Tricia Campbell is a 26 y.o. female G2P0010 @ [redacted]w[redacted]d here in MAU with abdominal cramping. "It feels like I'm coming on my period". She denies bleeding. The pain started 3-4 days ago. The pain comes and goes. She has not taken any medication for it because she "does not want to get on the medication thing".   OB History    Gravida Para Term Preterm AB Living   2       1 0   SAB TAB Ectopic Multiple Live Births     1     0      Past Medical History:  Diagnosis Date  . Medical history non-contributory     Past Surgical History:  Procedure Laterality Date  . WISDOM TOOTH EXTRACTION      Family History  Problem Relation Age of Onset  . Diabetes Maternal Grandmother     Social History  Substance Use Topics  . Smoking status: Former Smoker    Packs/day: 0.20    Types: Cigarettes, Cigars    Quit date: 12/16/2015  . Smokeless tobacco: Never Used  . Alcohol use No    Allergies: No Known Allergies  Prescriptions Prior to Admission  Medication Sig Dispense Refill Last Dose  . Prenatal Vit-Fe Fumarate-FA (PRENATAL MULTIVITAMIN) TABS tablet Take 1 tablet by mouth daily at 12 noon.   10/23/2016 at Unknown time  . butalbital-acetaminophen-caffeine (FIORICET, ESGIC) 50-325-40 MG tablet Take 1-2 tablets by mouth every 6 (six) hours as needed for headache. (Patient not taking: Reported on 10/09/2016) 20 tablet 1 Not Taking at Unknown time   Results for orders placed or performed during the hospital encounter of 10/24/16 (from the past 48 hour(s))  Urinalysis, Routine w reflex microscopic     Status: Abnormal   Collection Time: 10/24/16  6:05 PM  Result Value Ref Range   Color, Urine YELLOW YELLOW   APPearance HAZY (A) CLEAR   Specific Gravity, Urine 1.021 1.005 -  1.030   pH 6.0 5.0 - 8.0   Glucose, UA NEGATIVE NEGATIVE mg/dL   Hgb urine dipstick NEGATIVE NEGATIVE   Bilirubin Urine NEGATIVE NEGATIVE   Ketones, ur NEGATIVE NEGATIVE mg/dL   Protein, ur NEGATIVE NEGATIVE mg/dL   Nitrite NEGATIVE NEGATIVE   Leukocytes, UA TRACE (A) NEGATIVE   RBC / HPF 0-5 0 - 5 RBC/hpf   WBC, UA 6-30 0 - 5 WBC/hpf   Bacteria, UA NONE SEEN NONE SEEN   Squamous Epithelial / LPF 6-30 (A) NONE SEEN   Mucous PRESENT     Review of Systems  Constitutional: Negative for fever.  Gastrointestinal: Positive for abdominal pain (Lower abdomen: bilateral ).  Genitourinary: Negative for dysuria and vaginal bleeding.   Physical Exam   Blood pressure (!) 104/59, pulse 81, temperature 98.4 F (36.9 C), resp. rate 18, last menstrual period 05/10/2016.  Physical Exam  Constitutional: She is oriented to person, place, and time. She appears well-developed and well-nourished. No distress.  HENT:  Head: Normocephalic.  Eyes: Pupils are equal, round, and reactive to light.  Neck: Neck supple.  GI: Soft. She exhibits no distension. There is no tenderness. There is no rebound and no guarding.  Genitourinary:  Genitourinary Comments: Vagina - Small amount of white vaginal discharge, no  odor  Cervix - No contact bleeding, no active bleeding  Bimanual exam: Cervix  Dilation: Closed Effacement (%): Thick Cervical Position: Posterior Exam by:: J. Glenville Espina NP  Wet prep done Chaperone present for exam.   Musculoskeletal: Normal range of motion.  Neurological: She is alert and oriented to person, place, and time.  Skin: Skin is warm. She is not diaphoretic.  Psychiatric: Her behavior is normal.    MAU Course  Procedures  None  MDM  Urine culture sent Wet prep + fetal heart tones.   Assessment and Plan   A:  1. Abdominal pain in pregnancy, second trimester   2. BV (bacterial vaginosis)   3. Feeling worried     P:  Discharge home in stable  condition OM:[id number] Spent a significant amount of time answering questions and concerns.Patient feels reassured Strict return precautions  Lezlie Lye, NP 10/27/2016 9:16 AM

## 2016-10-24 NOTE — Discharge Instructions (Signed)
Abdominal Pain During Pregnancy °Belly (abdominal) pain is common during pregnancy. Most of the time, it is not a serious problem. Other times, it can be a sign that something is wrong with the pregnancy. Always tell your doctor if you have belly pain. °Follow these instructions at home: °Monitor your belly pain for any changes. The following actions may help you feel better: °· Do not have sex (intercourse) or put anything in your vagina until you feel better. °· Rest until your pain stops. °· Drink clear fluids if you feel sick to your stomach (nauseous). Do not eat solid food until you feel better. °· Only take medicine as told by your doctor. °· Keep all doctor visits as told. °Get help right away if: °· You are bleeding, leaking fluid, or pieces of tissue come out of your vagina. °· You have more pain or cramping. °· You keep throwing up (vomiting). °· You have pain when you pee (urinate) or have blood in your pee. °· You have a fever. °· You do not feel your baby moving as much. °· You feel very weak or feel like passing out. °· You have trouble breathing, with or without belly pain. °· You have a very bad headache and belly pain. °· You have fluid leaking from your vagina and belly pain. °· You keep having watery poop (diarrhea). °· Your belly pain does not go away after resting, or the pain gets worse. °This information is not intended to replace advice given to you by your health care provider. Make sure you discuss any questions you have with your health care provider. °Document Released: 07/11/2009 Document Revised: 02/29/2016 Document Reviewed: 02/19/2013 °Elsevier Interactive Patient Education © 2017 [employer] ° °

## 2016-10-24 NOTE — MAU Note (Signed)
Pt presents to MAU with abdominal cramping. Pt states for the last 2-3 days it feels as if "my periods going to come on." denies bleeding, leaking of fluid or abnormal discharge.

## 2016-10-26 LAB — CULTURE, OB URINE: Special Requests: NORMAL

## 2016-10-29 ENCOUNTER — Telehealth: Payer: Self-pay | Admitting: *Deleted

## 2016-10-29 NOTE — Telephone Encounter (Signed)
Returned patient call, patient stated she was having pain in her lower part of stomach,She said she takes Flagil it in the morning and at night with food, then said it was her vagina what was feeling irritated. Patient feeling its because of Flagil medication she started on 3/21. Patient said that it feels a little like yeast infection starting but she has no discharge. Advised patient that we can send in prescription to pharmacy for cream and she decided that she will buy over the counter Monistat. Also reminded patient of appointment on 11/06/16, and she will talk to the provider more then.

## 2016-10-29 NOTE — Telephone Encounter (Signed)
Patient called front desk and reported having irritation from medicine and wants a call back.

## 2016-11-06 ENCOUNTER — Ambulatory Visit (INDEPENDENT_AMBULATORY_CARE_PROVIDER_SITE_OTHER): Payer: Medicaid Other | Admitting: Medical

## 2016-11-06 VITALS — BP 109/58 | HR 85 | Wt 131.0 lb

## 2016-11-06 DIAGNOSIS — Z3402 Encounter for supervision of normal first pregnancy, second trimester: Secondary | ICD-10-CM

## 2016-11-06 DIAGNOSIS — Z3482 Encounter for supervision of other normal pregnancy, second trimester: Secondary | ICD-10-CM

## 2016-11-06 NOTE — Progress Notes (Signed)
Patient would like to be rechecked to make sure infection is cleared.

## 2016-11-06 NOTE — Patient Instructions (Signed)
Second Trimester of Pregnancy The second trimester is from week 13 through week 28, month 4 through 6. This is often the time in pregnancy that you feel your best. Often times, morning sickness has lessened or quit. You may have more energy, and you may get hungry more often. Your unborn baby (fetus) is growing rapidly. At the end of the sixth month, he or she is about 9 inches long and weighs about 1 pounds. You will likely feel the baby move (quickening) between 18 and 20 weeks of pregnancy. Follow these instructions at home:  Avoid all smoking, herbs, and alcohol. Avoid drugs not approved by your doctor.  Do not use any tobacco products, including cigarettes, chewing tobacco, and electronic cigarettes. If you need help quitting, ask your doctor. You may get counseling or other support to help you quit.  Only take medicine as told by your doctor. Some medicines are safe and some are not during pregnancy.  Exercise only as told by your doctor. Stop exercising if you start having cramps.  Eat regular, healthy meals.  Wear a good support bra if your breasts are tender.  Do not use hot tubs, steam rooms, or saunas.  Wear your seat belt when driving.  Avoid raw meat, uncooked cheese, and liter boxes and soil used by cats.  Take your prenatal vitamins.  Take 1500-2000 milligrams of calcium daily starting at the 20th week of pregnancy until you deliver your baby.  Try taking medicine that helps you poop (stool softener) as needed, and if your doctor approves. Eat more fiber by eating fresh fruit, vegetables, and whole grains. Drink enough fluids to keep your pee (urine) clear or pale yellow.  Take warm water baths (sitz baths) to soothe pain or discomfort caused by hemorrhoids. Use hemorrhoid cream if your doctor approves.  If you have puffy, bulging veins (varicose veins), wear support hose. Raise (elevate) your feet for 15 minutes, 3-4 times a day. Limit salt in your diet.  Avoid heavy  lifting, wear low heals, and sit up straight.  Rest with your legs raised if you have leg cramps or low back pain.  Visit your dentist if you have not gone during your pregnancy. Use a soft toothbrush to brush your teeth. Be gentle when you floss.  You can have sex (intercourse) unless your doctor tells you not to.  Go to your doctor visits. Get help if:  You feel dizzy.  You have mild cramps or pressure in your lower belly (abdomen).  You have a nagging pain in your belly area.  You continue to feel sick to your stomach (nauseous), throw up (vomit), or have watery poop (diarrhea).  You have bad smelling fluid coming from your vagina.  You have pain with peeing (urination). Get help right away if:  You have a fever.  You are leaking fluid from your vagina.  You have spotting or bleeding from your vagina.  You have severe belly cramping or pain.  You lose or gain weight rapidly.  You have trouble catching your breath and have chest pain.  You notice sudden or extreme puffiness (swelling) of your face, hands, ankles, feet, or legs.  You have not felt the baby move in over an hour.  You have severe headaches that do not go away with medicine.  You have vision changes. This information is not intended to replace advice given to you by your health care provider. Make sure you discuss any questions you have with your health care   provider. Document Released: 10/17/2009 Document Revised: 12/29/2015 Document Reviewed: 09/23/2012 Elsevier Interactive Patient Education  2017 [employer]  

## 2016-11-06 NOTE — Progress Notes (Signed)
   PRENATAL VISIT NOTE  Subjective:  Tricia Campbell is a 26 y.o. G2P0010 at [redacted]w[redacted]d being seen today for ongoing prenatal care.  She is currently monitored for the following issues for this low-risk pregnancy and has Supervision of normal pregnancy in first trimester and Marijuana use on her problem list.  Patient reports no complaints.  Contractions: Not present. Vag. Bleeding: None.  Movement: Present. Denies leaking of fluid.   The following portions of the patient's history were reviewed and updated as appropriate: allergies, current medications, past family history, past medical history, past social history, past surgical history and problem list. Problem list updated.  Objective:   Vitals:   11/06/16 1046  BP: (!) 109/58  Pulse: 85  Weight: 131 lb (59.4 kg)    Fetal Status: Fetal Heart Rate (bpm): 148 Fundal Height: 23 cm Movement: Present     General:  Alert, oriented and cooperative. Patient is in no acute distress.  Skin: Skin is warm and dry. No rash noted.   Cardiovascular: Normal heart rate noted  Respiratory: Normal respiratory effort, no problems with respiration noted  Abdomen: Soft, gravid, appropriate for gestational age. Pain/Pressure: Present     Pelvic:  Cervical exam deferred        Extremities: Normal range of motion.  Edema: None  Mental Status: Normal mood and affect. Normal behavior. Normal judgment and thought content.   Assessment and Plan:  Pregnancy: G2P0010 at [redacted]w[redacted]d  1. Encounter for supervision of normal first pregnancy in second trimester - Discussed appropriate OTC medications for allergic rhinitis in pregnancy - Discussed need for fasting labs at next visit in 4 weeks   Preterm labor symptoms and general obstetric precautions including but not limited to vaginal bleeding, contractions, leaking of fluid and fetal movement were reviewed in detail with the patient. Please refer to After Visit Summary for other counseling recommendations.    Return in about 4 weeks (around 12/04/2016) for LOB and third trimester labs/fasting.   Luvenia Redden, PA-C

## 2016-11-12 ENCOUNTER — Encounter (HOSPITAL_BASED_OUTPATIENT_CLINIC_OR_DEPARTMENT_OTHER): Payer: Self-pay | Admitting: Emergency Medicine

## 2016-11-12 ENCOUNTER — Emergency Department (HOSPITAL_BASED_OUTPATIENT_CLINIC_OR_DEPARTMENT_OTHER)
Admission: EM | Admit: 2016-11-12 | Discharge: 2016-11-12 | Disposition: A | Discharge status: Home / Self Care | Payer: Medicaid Other | Attending: Emergency Medicine | Admitting: Emergency Medicine

## 2016-11-12 DIAGNOSIS — Z87891 Personal history of nicotine dependence: Secondary | ICD-10-CM | POA: Insufficient documentation

## 2016-11-12 DIAGNOSIS — O23592 Infection of other part of genital tract in pregnancy, second trimester: Secondary | ICD-10-CM | POA: Insufficient documentation

## 2016-11-12 DIAGNOSIS — B373 Candidiasis of vulva and vagina: Secondary | ICD-10-CM | POA: Insufficient documentation

## 2016-11-12 DIAGNOSIS — O26892 Other specified pregnancy related conditions, second trimester: Secondary | ICD-10-CM | POA: Diagnosis present

## 2016-11-12 DIAGNOSIS — Z3A24 24 weeks gestation of pregnancy: Secondary | ICD-10-CM | POA: Insufficient documentation

## 2016-11-12 DIAGNOSIS — Z79899 Other long term (current) drug therapy: Secondary | ICD-10-CM | POA: Diagnosis not present

## 2016-11-12 DIAGNOSIS — B3731 Acute candidiasis of vulva and vagina: Secondary | ICD-10-CM

## 2016-11-12 LAB — URINALYSIS, ROUTINE W REFLEX MICROSCOPIC
Bilirubin Urine: NEGATIVE
Glucose, UA: NEGATIVE mg/dL
Hgb urine dipstick: NEGATIVE
Ketones, ur: NEGATIVE mg/dL
NITRITE: NEGATIVE
PH: 7 (ref 5.0–8.0)
PROTEIN: NEGATIVE mg/dL
Specific Gravity, Urine: 1.01 (ref 1.005–1.030)

## 2016-11-12 LAB — URINALYSIS, MICROSCOPIC (REFLEX)

## 2016-11-12 LAB — WET PREP, GENITAL
Clue Cells Wet Prep HPF POC: NONE SEEN
Sperm: NONE SEEN
Trich, Wet Prep: NONE SEEN

## 2016-11-12 MED ORDER — CLOTRIMAZOLE 1 % VA CREA: 1.0000 | TOPICAL_CREAM | Freq: Every day | VAGINAL | 0 refills | Status: DC

## 2016-11-12 NOTE — ED Provider Notes (Signed)
[facility] DEPT [facility] Provider Note   CSN: [id number] Arrival date & time: 11/12/16  1805   By signing my name below, I, Soijett Blue, attest that this documentation has been prepared under the direction and in the presence of Lenn Sink, PA-C Electronically Signed: Soijett Blue, ED Scribe. 11/12/16. 7:41 PM.  History   Chief Complaint Chief Complaint  Patient presents with  . Vaginal Discharge    [redacted] wks Pregnant    HPI  Tricia Campbell is a 26 y.o. female who presents to the Emergency Department complaining of mild vaginal discharge onset 12 days ago. She states that she is currently [redacted] weeks pregnant and this is her first pregnancy and she has adequate OB follow up. Pt notes that she was taking flagyl for a recent diagnosis of BV on 10/24/2016 and didn't have any vaginal discharge prior to taking flagyl. She states that her last dose of flagyl was 12 days ago. Pt reports associated vaginal itching x 8 days, vaginal irritation, dysuria due to vaginal irritation. Pt has tried generic monistat x 3 days and vaseline with no relief of her symptoms. Denies vaginal bleeding, abdominal pain, fever, chills, and any other symptoms.    The history is provided by the patient. No language interpreter was used.    Past Medical History:  Diagnosis Date  . Medical history non-contributory     Patient Active Problem List   Diagnosis Date Noted  . 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 Supervision of normal pregnancy in first trimester 08/14/2016    Past Surgical History:  Procedure Laterality Date  . WISDOM TOOTH EXTRACTION      OB History    Gravida Para Term Preterm AB Living   2       1 0   SAB TAB Ectopic Multiple Live Births     1     0       Home Medications    Prior to Admission medications   Medication Sig Start Date End Date Taking? Authorizing Provider  clotrimazole (GYNE-LOTRIMIN) 1 % vaginal cream Place 1 Applicatorful vaginally at bedtime. 11/12/16   Okey Regal,  PA-C  metroNIDAZOLE (FLAGYL) 500 MG tablet Take 1 tablet (500 mg total) by mouth 2 (two) times daily. Patient not taking: Reported on 11/06/2016 10/24/16   Lezlie Lye, NP  Prenatal Vit-Fe Fumarate-FA (PRENATAL MULTIVITAMIN) TABS tablet Take 1 tablet by mouth daily at 12 noon.    Historical Provider, MD    Family History Family History  Problem Relation Age of Onset  . Diabetes Maternal Grandmother     Social History Social History  Substance Use Topics  . Smoking status: Former Smoker    Packs/day: 0.20    Types: Cigarettes, Cigars    Quit date: 12/16/2015  . Smokeless tobacco: Never Used  . Alcohol use No     Allergies   Patient has no known allergies.   Review of Systems Review of Systems  Constitutional: Negative for chills and fever.  Genitourinary: Positive for dysuria (due to vaginal irritation) and vaginal discharge. Negative for vaginal bleeding.       +vaginal itching. +vaginal irritation     Physical Exam Updated Vital Signs BP 117/75 (BP Location: Left Arm)   Pulse 90   Temp 98.2 F (36.8 C) (Oral)   Resp 18   LMP 05/10/2016 (Approximate)   SpO2 100%   Physical Exam  Constitutional: She is oriented to person, place, and time. She appears well-developed and well-nourished. No distress.  HENT:  Head: Normocephalic and atraumatic.  Eyes: EOM are normal.  Neck: Neck supple.  Cardiovascular: Normal rate.   Pulmonary/Chest: Effort normal. No respiratory distress.  Abdominal: She exhibits no distension.  Gravid abdomen.  Genitourinary: Vaginal discharge found.  Genitourinary Comments: Nurse chaperone present for exam. Sticky white discharge. Cervical os closed.   Musculoskeletal: Normal range of motion.  Neurological: She is alert and oriented to person, place, and time.  Skin: Skin is warm and dry.  Psychiatric: She has a normal mood and affect. Her behavior is normal.  Nursing note and vitals reviewed.   ED Treatments / Results  DIAGNOSTIC  STUDIES: Oxygen Saturation is 100% on RA, nl by my interpretation.    COORDINATION OF CARE: 7:36 PM Discussed treatment plan with pt at bedside which includes UA, wet prep, and pt agreed to plan.   Labs (all labs ordered are listed, but only abnormal results are displayed) Labs Reviewed  WET PREP, GENITAL - Abnormal; Notable for the following:       Result Value   Yeast Wet Prep HPF POC PRESENT (*)    WBC, Wet Prep HPF POC MANY (*)    All other components within normal limits  URINALYSIS, ROUTINE W REFLEX MICROSCOPIC - Abnormal; Notable for the following:    Leukocytes, UA MODERATE (*)    All other components within normal limits  URINALYSIS, MICROSCOPIC (REFLEX) - Abnormal; Notable for the following:    Bacteria, UA RARE (*)    Squamous Epithelial / LPF 0-5 (*)    All other components within normal limits  GC/CHLAMYDIA PROBE AMP ([place]) NOT AT [facility]    Procedures Procedures (including critical care time)  Medications Ordered in ED Medications - No data to display   Initial Impression / Assessment and Plan / ED Course  I have reviewed the triage vital signs and the nursing notes.  Pertinent labs that were available during my care of the patient were reviewed by me and considered in my medical decision making (see chart for details).      Assessment/Plan: This is presentation is most consistent with yeast vaginosis.  She is afebrile nontoxic with a nontender gravid abdomen.  Patient's yeast infection likely secondary to recent antibiotic use.  She will be instructed to use clotrimazole for 7 days at bedtime followed by OB/GYN follow-up if symptoms persist.  Patient given return precautions.  She verbalized understanding and agreement to today's plan.  Final Clinical Impressions(s) / ED Diagnoses   Final diagnoses:  Yeast vaginitis    New Prescriptions Discharge Medication List as of 11/12/2016  9:20 PM    START taking these medications   Details  clotrimazole  (GYNE-LOTRIMIN) 1 % vaginal cream Place 1 Applicatorful vaginally at bedtime., Starting Mon 11/12/2016, Print      I personally performed the services described in this documentation, which was scribed in my presence. The recorded information has been reviewed and is accurate.     Okey Regal, PA-C 11/12/16 2131    Malvin Johns, MD 11/12/16 2145

## 2016-11-12 NOTE — ED Notes (Signed)
Patient denies pain and is resting comfortably.  

## 2016-11-12 NOTE — ED Notes (Signed)
Pt verbalizes understanding of d/c instructions and denies any further needs at this time. 

## 2016-11-12 NOTE — ED Notes (Signed)
Dysuria with vag d/c at times.Denies abd pain or vag bleeding

## 2016-11-12 NOTE — ED Triage Notes (Signed)
Pt is [redacted] weeks pregnant. Pt was diagnosed with BV, treated with flagyl and now has vaginal irritation and discharge for the past week. Pt states she had no symptoms prior to taking the antibiotic. Denies abd pain.

## 2016-11-12 NOTE — Discharge Instructions (Signed)
Please read attached information. If you experience any new or worsening signs or symptoms please return to the emergency room for evaluation. Please follow-up with your primary care provider or specialist as discussed. Please use medication prescribed only as directed and discontinue taking if you have any concerning signs or symptoms.   °

## 2016-11-12 NOTE — ED Notes (Signed)
Pt ambulatory to restroom.  She is sitting comfortably on stretcher talking on phone.

## 2016-11-12 NOTE — ED Notes (Signed)
ED Provider at bedside. 

## 2016-11-13 LAB — GC/CHLAMYDIA PROBE AMP (~~LOC~~) NOT AT ARMC
Chlamydia: NEGATIVE
Neisseria Gonorrhea: NEGATIVE

## 2016-12-05 ENCOUNTER — Encounter: Payer: Self-pay | Admitting: Family Medicine

## 2016-12-05 ENCOUNTER — Ambulatory Visit (INDEPENDENT_AMBULATORY_CARE_PROVIDER_SITE_OTHER): Payer: Medicaid Other | Admitting: Advanced Practice Midwife

## 2016-12-05 ENCOUNTER — Other Ambulatory Visit (HOSPITAL_COMMUNITY)
Admission: RE | Admit: 2016-12-05 | Discharge: 2016-12-05 | Disposition: A | Discharge status: Home / Self Care | Payer: Medicaid Other | Source: Ambulatory Visit | Attending: Advanced Practice Midwife | Admitting: Advanced Practice Midwife

## 2016-12-05 VITALS — BP 109/61 | HR 86 | Wt 134.7 lb

## 2016-12-05 DIAGNOSIS — Z23 Encounter for immunization: Secondary | ICD-10-CM | POA: Diagnosis not present

## 2016-12-05 DIAGNOSIS — O98812 Other maternal infectious and parasitic diseases complicating pregnancy, second trimester: Secondary | ICD-10-CM | POA: Insufficient documentation

## 2016-12-05 DIAGNOSIS — Z3A27 27 weeks gestation of pregnancy: Secondary | ICD-10-CM | POA: Insufficient documentation

## 2016-12-05 DIAGNOSIS — B373 Candidiasis of vulva and vagina: Secondary | ICD-10-CM | POA: Insufficient documentation

## 2016-12-05 DIAGNOSIS — B3731 Acute candidiasis of vulva and vagina: Secondary | ICD-10-CM

## 2016-12-05 DIAGNOSIS — N898 Other specified noninflammatory disorders of vagina: Secondary | ICD-10-CM | POA: Diagnosis not present

## 2016-12-05 DIAGNOSIS — Z3493 Encounter for supervision of normal pregnancy, unspecified, third trimester: Secondary | ICD-10-CM

## 2016-12-05 DIAGNOSIS — Z3401 Encounter for supervision of normal first pregnancy, first trimester: Secondary | ICD-10-CM | POA: Diagnosis not present

## 2016-12-05 DIAGNOSIS — Z3492 Encounter for supervision of normal pregnancy, unspecified, second trimester: Secondary | ICD-10-CM | POA: Insufficient documentation

## 2016-12-05 DIAGNOSIS — O26892 Other specified pregnancy related conditions, second trimester: Secondary | ICD-10-CM

## 2016-12-05 MED ORDER — CLOTRIMAZOLE 1 % VA CREA: 1.0000 | TOPICAL_CREAM | Freq: Every day | VAGINAL | 1 refills | Status: DC

## 2016-12-05 NOTE — Progress Notes (Signed)
28 week labs, unsure about tdap C/o possible yeast inf

## 2016-12-05 NOTE — Progress Notes (Signed)
   PRENATAL VISIT NOTE  Subjective:  Tricia Campbell is a 26 y.o. G2P0010 at [redacted]w[redacted]d being seen today for ongoing prenatal care.  She is currently monitored for the following issues for this low-risk pregnancy and has Supervision of normal pregnancy in first trimester and Marijuana use on her problem list.  Patient reports vaginal irritation and discharge..  Contractions: Not present. Vag. Bleeding: None.  Movement: Present. Denies leaking of fluid.   The following portions of the patient's history were reviewed and updated as appropriate: allergies, current medications, past family history, past medical history, past social history, past surgical history and problem list. Problem list updated.  Objective:   Vitals:   12/05/16 0754  BP: 109/61  Pulse: 86  Weight: 134 lb 11.2 oz (61.1 kg)    Fetal Status: Fetal Heart Rate (bpm): 145 Fundal Height: 27 cm Movement: Present  Presentation: Vertex  General:  Alert, oriented and cooperative. Patient is in no acute distress.  Skin: Skin is warm and dry. No rash noted.   Cardiovascular: Normal heart rate noted  Respiratory: Normal respiratory effort, no problems with respiration noted  Abdomen: Soft, gravid, appropriate for gestational age. Pain/Pressure: Absent     Pelvic:  Cervical exam deferred      Mod amount of thick white odorless discharge  Extremities: Normal range of motion.  Edema: None  Mental Status: Normal mood and affect. Normal behavior. Normal judgment and thought content.   Assessment and Plan:  Pregnancy: G2P0010 at [redacted]w[redacted]d  1. Encounter for supervision of normal pregnancy in third trimester, unspecified gravidity  - Glucose Tolerance, 2 Hours w/1 Hour - CBC - HIV antibody (with reflex) - RPR  2. Need for Tdap vaccination  - Tdap vaccine greater than or equal to 7yo IM  3. Encounter for supervision of normal first pregnancy in first trimester   4. Vaginal yeast infection  - clotrimazole (GYNE-LOTRIMIN) 1  % vaginal cream; Place 1 Applicatorful vaginally at bedtime.  Dispense: 45 g; Refill: 1  5. Vaginal discharge during pregnancy in second trimester  - clotrimazole (GYNE-LOTRIMIN) 1 % vaginal cream; Place 1 Applicatorful vaginally at bedtime.  Dispense: 45 g; Refill: 1  Preterm labor symptoms and general obstetric precautions including but not limited to vaginal bleeding, contractions, leaking of fluid and fetal movement were reviewed in detail with the patient. Please refer to After Visit Summary for other counseling recommendations.  Return in about 2 weeks (around 12/19/2016) for [place].   Manya Silvas, CNM

## 2016-12-05 NOTE — Patient Instructions (Signed)
Tdap Vaccine (Tetanus, Diphtheria and Pertussis): What You Need to Know 1. Why get vaccinated? Tetanus, diphtheria and pertussis are very serious diseases. Tdap vaccine can protect us from these diseases. And, Tdap vaccine given to pregnant women can protect newborn babies against pertussis. TETANUS (Lockjaw) is rare in the [place] today. It causes painful muscle tightening and stiffness, usually all over the body.  It can lead to tightening of muscles in the head and neck so you can't open your mouth, swallow, or sometimes even breathe. Tetanus kills about 1 out of 10 people who are infected even after receiving the best medical care.  DIPHTHERIA is also rare in the [place] today. It can cause a thick coating to form in the back of the throat.  It can lead to breathing problems, heart failure, paralysis, and death.  PERTUSSIS (Whooping Cough) causes severe coughing spells, which can cause difficulty breathing, vomiting and disturbed sleep.  It can also lead to weight loss, incontinence, and rib fractures. Up to 2 in 100 adolescents and 5 in 100 adults with pertussis are hospitalized or have complications, which could include pneumonia or death.  These diseases are caused by bacteria. Diphtheria and pertussis are spread from person to person through secretions from coughing or sneezing. Tetanus enters the body through cuts, scratches, or wounds. Before vaccines, as many as 200,000 cases of diphtheria, 200,000 cases of pertussis, and hundreds of cases of tetanus, were reported in the [place] each year. Since vaccination began, reports of cases for tetanus and diphtheria have dropped by about 99% and for pertussis by about 80%. 2. Tdap vaccine Tdap vaccine can protect adolescents and adults from tetanus, diphtheria, and pertussis. One dose of Tdap is routinely given at age 11 or 12. People who did not get Tdap at that age should get it as soon as possible. Tdap is especially  important for healthcare professionals and anyone having close contact with a baby younger than 12 months. Pregnant women should get a dose of Tdap during every pregnancy, to protect the newborn from pertussis. Infants are most at risk for severe, life-threatening complications from pertussis. Another vaccine, called Td, protects against tetanus and diphtheria, but not pertussis. A Td booster should be given every 10 years. Tdap may be given as one of these boosters if you have never gotten Tdap before. Tdap may also be given after a severe cut or burn to prevent tetanus infection. Your doctor or the person giving you the vaccine can give you more information. Tdap may safely be given at the same time as other vaccines. 3. Some people should not get this vaccine  A person who has ever had a life-threatening allergic reaction after a previous dose of any diphtheria, tetanus or pertussis containing vaccine, OR has a severe allergy to any part of this vaccine, should not get Tdap vaccine. Tell the person giving the vaccine about any severe allergies.  Anyone who had coma or long repeated seizures within 7 days after a childhood dose of DTP or DTaP, or a previous dose of Tdap, should not get Tdap, unless a cause other than the vaccine was found. They can still get Td.  Talk to your doctor if you: ? have seizures or another nervous system problem, ? had severe pain or swelling after any vaccine containing diphtheria, tetanus or pertussis, ? ever had a condition called Guillain-Barr Syndrome (GBS), ? aren't feeling well on the day the shot is scheduled. 4. Risks With any medicine, including   vaccines, there is a chance of side effects. These are usually mild and go away on their own. Serious reactions are also possible but are rare. Most people who get Tdap vaccine do not have any problems with it. Mild problems following Tdap: (Did not interfere with activities)  Pain where the shot was given (about  3 in 4 adolescents or 2 in 3 adults)  Redness or swelling where the shot was given (about 1 person in 5)  Mild fever of at least 100.4F (up to about 1 in 25 adolescents or 1 in 100 adults)  Headache (about 3 or 4 people in 10)  Tiredness (about 1 person in 3 or 4)  Nausea, vomiting, diarrhea, stomach ache (up to 1 in 4 adolescents or 1 in 10 adults)  Chills, sore joints (about 1 person in 10)  Body aches (about 1 person in 3 or 4)  Rash, swollen glands (uncommon)  Moderate problems following Tdap: (Interfered with activities, but did not require medical attention)  Pain where the shot was given (up to 1 in 5 or 6)  Redness or swelling where the shot was given (up to about 1 in 16 adolescents or 1 in 12 adults)  Fever over 102F (about 1 in 100 adolescents or 1 in 250 adults)  Headache (about 1 in 7 adolescents or 1 in 10 adults)  Nausea, vomiting, diarrhea, stomach ache (up to 1 or 3 people in 100)  Swelling of the entire arm where the shot was given (up to about 1 in 500).  Severe problems following Tdap: (Unable to perform usual activities; required medical attention)  Swelling, severe pain, bleeding and redness in the arm where the shot was given (rare).  Problems that could happen after any vaccine:  People sometimes faint after a medical procedure, including vaccination. Sitting or lying down for about 15 minutes can help prevent fainting, and injuries caused by a fall. Tell your doctor if you feel dizzy, or have vision changes or ringing in the ears.  Some people get severe pain in the shoulder and have difficulty moving the arm where a shot was given. This happens very rarely.  Any medication can cause a severe allergic reaction. Such reactions from a vaccine are very rare, estimated at fewer than 1 in a million doses, and would happen within a few minutes to a few hours after the vaccination. As with any medicine, there is a very remote chance of a vaccine  causing a serious injury or death. The safety of vaccines is always being monitored. For more information, visit: www.cdc.gov/vaccinesafety/ 5. What if there is a serious problem? What should I look for? Look for anything that concerns you, such as signs of a severe allergic reaction, very high fever, or unusual behavior. Signs of a severe allergic reaction can include hives, swelling of the face and throat, difficulty breathing, a fast heartbeat, dizziness, and weakness. These would usually start a few minutes to a few hours after the vaccination. What should I do?  If you think it is a severe allergic reaction or other emergency that can't wait, call 9-1-1 or get the person to the nearest hospital. Otherwise, call your doctor.  Afterward, the reaction should be reported to the Vaccine Adverse Event Reporting System (VAERS). Your doctor might file this report, or you can do it yourself through the VAERS web site at www.vaers.hhs.gov, or by calling 1-800-822-7967. ? VAERS does not give medical advice. 6. The National Vaccine Injury Compensation Program The National   Vaccine Injury Compensation Program (VICP) is a federal program that was created to compensate people who may have been injured by certain vaccines. Persons who believe they may have been injured by a vaccine can learn about the program and about filing a claim by calling 1-800-338-2382 or visiting the VICP website at www.hrsa.gov/vaccinecompensation. There is a time limit to file a claim for compensation. 7. How can I learn more?  Ask your doctor. He or she can give you the vaccine package insert or suggest other sources of information.  Call your local or state health department.  Contact the Centers for Disease Control and Prevention (CDC): ? Call 1-800-232-4636 (1-800-CDC-INFO) or ? Visit CDC's website at www.cdc.gov/vaccines CDC Tdap Vaccine VIS (09/29/13) This information is not intended to replace advice given to you by your  health care provider. Make sure you discuss any questions you have with your health care provider. Document Released: 01/22/2012 Document Revised: 04/12/2016 Document Reviewed: 04/12/2016 Elsevier Interactive Patient Education  2017 Elsevier Inc.  

## 2016-12-06 LAB — HIV ANTIBODY (ROUTINE TESTING W REFLEX): HIV SCREEN 4TH GENERATION: NONREACTIVE

## 2016-12-06 LAB — CBC
HEMATOCRIT: 35.9 % (ref 34.0–46.6)
Hemoglobin: 11.5 g/dL (ref 11.1–15.9)
MCH: 30.7 pg (ref 26.6–33.0)
MCHC: 32 g/dL (ref 31.5–35.7)
MCV: 96 fL (ref 79–97)
Platelets: 231 10*3/uL (ref 150–379)
RBC: 3.74 x10E6/uL — ABNORMAL LOW (ref 3.77–5.28)
RDW: 14.1 % (ref 12.3–15.4)
WBC: 9.6 10*3/uL (ref 3.4–10.8)

## 2016-12-06 LAB — GLUCOSE TOLERANCE, 2 HOURS W/ 1HR
GLUCOSE, 1 HOUR: 55 mg/dL — AB (ref 65–179)
GLUCOSE, 2 HOUR: 72 mg/dL (ref 65–152)
GLUCOSE, FASTING: 73 mg/dL (ref 65–91)

## 2016-12-06 LAB — CERVICOVAGINAL ANCILLARY ONLY
BACTERIAL VAGINITIS: NEGATIVE
Candida vaginitis: POSITIVE — AB
Trichomonas: NEGATIVE

## 2016-12-06 LAB — RPR: RPR: NONREACTIVE

## 2016-12-06 MED ORDER — CLOTRIMAZOLE 1 % VA CREA: 1.0000 | TOPICAL_CREAM | Freq: Every day | VAGINAL | 1 refills | Status: DC

## 2016-12-06 NOTE — Addendum Note (Signed)
Addended by: Shelly Coss on: 12/06/2016 08:43 AM   Modules accepted: Orders

## 2016-12-10 ENCOUNTER — Telehealth: Payer: Self-pay

## 2016-12-10 NOTE — Telephone Encounter (Signed)
-----   Message from [place], [place] sent at 12/07/2016  1:19 PM EDT ----- Please inform pt of Dx Yeast infection only. Already has Rx. GTT Nml.

## 2016-12-10 NOTE — Telephone Encounter (Signed)
Called patient inform her of her test results and need to take yeast infection medications.

## 2016-12-13 ENCOUNTER — Encounter (HOSPITAL_COMMUNITY): Payer: Self-pay | Admitting: *Deleted

## 2016-12-13 ENCOUNTER — Inpatient Hospital Stay (HOSPITAL_COMMUNITY)
Admission: AD | Admit: 2016-12-13 | Discharge: 2016-12-13 | Disposition: A | Discharge status: Home / Self Care | Payer: Medicaid Other | Source: Ambulatory Visit | Attending: Obstetrics & Gynecology | Admitting: Obstetrics & Gynecology

## 2016-12-13 DIAGNOSIS — K529 Noninfective gastroenteritis and colitis, unspecified: Secondary | ICD-10-CM | POA: Insufficient documentation

## 2016-12-13 DIAGNOSIS — O99613 Diseases of the digestive system complicating pregnancy, third trimester: Secondary | ICD-10-CM | POA: Insufficient documentation

## 2016-12-13 DIAGNOSIS — R109 Unspecified abdominal pain: Secondary | ICD-10-CM | POA: Diagnosis not present

## 2016-12-13 DIAGNOSIS — O26893 Other specified pregnancy related conditions, third trimester: Secondary | ICD-10-CM | POA: Insufficient documentation

## 2016-12-13 DIAGNOSIS — Z3A00 Weeks of gestation of pregnancy not specified: Secondary | ICD-10-CM | POA: Insufficient documentation

## 2016-12-13 LAB — URINALYSIS, ROUTINE W REFLEX MICROSCOPIC
Bacteria, UA: NONE SEEN
Bilirubin Urine: NEGATIVE
Glucose, UA: NEGATIVE mg/dL
Hgb urine dipstick: NEGATIVE
KETONES UR: NEGATIVE mg/dL
Nitrite: NEGATIVE
PH: 7 (ref 5.0–8.0)
Protein, ur: NEGATIVE mg/dL
Specific Gravity, Urine: 1.016 (ref 1.005–1.030)

## 2016-12-13 MED ORDER — ONDANSETRON 8 MG PO TBDP
8.0000 mg | ORAL_TABLET | Freq: Once | ORAL | Status: AC
  Administered 2016-12-13: 8 mg via ORAL

## 2016-12-13 MED ORDER — LOPERAMIDE HCL 2 MG PO CAPS
ORAL_CAPSULE | ORAL | Status: AC
  Administered 2016-12-13: 4 mg via ORAL
  Filled 2016-12-13: qty 2

## 2016-12-13 MED ORDER — ONDANSETRON 8 MG PO TBDP
ORAL_TABLET | ORAL | Status: AC
  Administered 2016-12-13: 8 mg via ORAL
  Filled 2016-12-13: qty 1

## 2016-12-13 MED ORDER — ONDANSETRON 8 MG PO TBDP: 8.0000 mg | ORAL_TABLET | Freq: Three times a day (TID) | ORAL | 1 refills | Status: DC | PRN

## 2016-12-13 MED ORDER — LOPERAMIDE HCL 2 MG PO CAPS
4.0000 mg | ORAL_CAPSULE | Freq: Once | ORAL | Status: AC
  Administered 2016-12-13: 4 mg via ORAL

## 2016-12-13 NOTE — Discharge Instructions (Signed)

## 2016-12-14 LAB — CULTURE, OB URINE: CULTURE: NO GROWTH

## 2016-12-20 ENCOUNTER — Ambulatory Visit (INDEPENDENT_AMBULATORY_CARE_PROVIDER_SITE_OTHER): Payer: Medicaid Other | Admitting: Obstetrics and Gynecology

## 2016-12-20 VITALS — BP 103/58 | HR 84 | Wt 138.0 lb

## 2016-12-20 DIAGNOSIS — Z3482 Encounter for supervision of other normal pregnancy, second trimester: Secondary | ICD-10-CM

## 2016-12-20 DIAGNOSIS — Z3401 Encounter for supervision of normal first pregnancy, first trimester: Secondary | ICD-10-CM

## 2016-12-20 DIAGNOSIS — Z23 Encounter for immunization: Secondary | ICD-10-CM | POA: Diagnosis not present

## 2016-12-20 MED ORDER — TETANUS-DIPHTH-ACELL PERTUSSIS 5-2.5-18.5 LF-MCG/0.5 IM SUSP
0.5000 mL | Freq: Once | INTRAMUSCULAR | Status: AC
  Administered 2016-12-20: 0.5 mL via INTRAMUSCULAR

## 2016-12-20 NOTE — Patient Instructions (Signed)

## 2016-12-20 NOTE — Progress Notes (Signed)
   PRENATAL VISIT NOTE  Subjective:  Tricia Campbell is a 26 y.o. G2P0010 at [redacted]w[redacted]d being seen today for ongoing prenatal care.  She is currently monitored for the following issues for this low-risk pregnancy and has Supervision of normal pregnancy in first trimester and Marijuana use on her problem list.  Patient reports no complaints.  Contractions: Not present. Vag. Bleeding: None.  Movement: Present. Denies leaking of fluid.   The following portions of the patient's history were reviewed and updated as appropriate: allergies, current medications, past family history, past medical history, past social history, past surgical history and problem list. Problem list updated.  Objective:   Vitals:   12/20/16 1011  BP: (!) 103/58  Pulse: 84  Weight: 138 lb (62.6 kg)    Fetal Status: Fetal Heart Rate (bpm): 160   Movement: Present     General:  Alert, oriented and cooperative. Patient is in no acute distress.  Skin: Skin is warm and dry. No rash noted.   Cardiovascular: Normal heart rate noted  Respiratory: Normal respiratory effort, no problems with respiration noted  Abdomen: Soft, gravid, appropriate for gestational age. Pain/Pressure: Absent     Pelvic:  Cervical exam deferred        Extremities: Normal range of motion.  Edema: None  Mental Status: Normal mood and affect. Normal behavior. Normal judgment and thought content.   Assessment and Plan:  Pregnancy: G2P0010 at [redacted]w[redacted]d  1.Sup of low risk pregnancy - Tdap today - discussed breast feeding - 28 wk labs reviewed - follow up in 3 wks  Preterm labor symptoms and general obstetric precautions including but not limited to vaginal bleeding, contractions, leaking of fluid and fetal movement were reviewed in detail with the patient. Please refer to After Visit Summary for other counseling recommendations.  No Follow-up on file.   Jacquiline Doe, MD

## 2016-12-20 NOTE — Addendum Note (Signed)
Addended by: Christiana Pellant A on: 12/20/2016 10:45 AM   Modules accepted: Orders

## 2016-12-25 ENCOUNTER — Other Ambulatory Visit (HOSPITAL_COMMUNITY)
Admission: RE | Admit: 2016-12-25 | Discharge: 2016-12-25 | Disposition: A | Discharge status: Home / Self Care | Payer: Medicaid Other | Source: Ambulatory Visit | Attending: Obstetrics & Gynecology | Admitting: Obstetrics & Gynecology

## 2016-12-25 ENCOUNTER — Ambulatory Visit: Payer: Medicaid Other

## 2016-12-25 DIAGNOSIS — B9689 Other specified bacterial agents as the cause of diseases classified elsewhere: Secondary | ICD-10-CM | POA: Insufficient documentation

## 2016-12-25 DIAGNOSIS — O23593 Infection of other part of genital tract in pregnancy, third trimester: Secondary | ICD-10-CM | POA: Insufficient documentation

## 2016-12-25 DIAGNOSIS — N898 Other specified noninflammatory disorders of vagina: Secondary | ICD-10-CM | POA: Diagnosis present

## 2016-12-25 DIAGNOSIS — O26893 Other specified pregnancy related conditions, third trimester: Principal | ICD-10-CM

## 2016-12-25 DIAGNOSIS — Z3A29 29 weeks gestation of pregnancy: Secondary | ICD-10-CM | POA: Insufficient documentation

## 2016-12-25 DIAGNOSIS — B373 Candidiasis of vulva and vagina: Secondary | ICD-10-CM | POA: Insufficient documentation

## 2016-12-25 NOTE — Progress Notes (Signed)
Patient presented to office today with complaint of vaginal discharge. Patient was instructed on how to do a self swab so that we can determine if patient has some type of infection. Patient advised we will call her back with her results in the next couple days. Patient voice understanding at this.

## 2016-12-27 LAB — CERVICOVAGINAL ANCILLARY ONLY
BACTERIAL VAGINITIS: NEGATIVE
CANDIDA VAGINITIS: POSITIVE — AB
Chlamydia: NEGATIVE
NEISSERIA GONORRHEA: NEGATIVE
TRICH (WINDOWPATH): NEGATIVE

## 2016-12-29 ENCOUNTER — Inpatient Hospital Stay (HOSPITAL_COMMUNITY)
Admission: AD | Admit: 2016-12-29 | Discharge: 2016-12-29 | Disposition: A | Discharge status: Home / Self Care | Payer: Medicaid Other | Source: Ambulatory Visit | Attending: Obstetrics and Gynecology | Admitting: Obstetrics and Gynecology

## 2016-12-29 ENCOUNTER — Encounter (HOSPITAL_COMMUNITY): Payer: Self-pay | Admitting: *Deleted

## 2016-12-29 DIAGNOSIS — N898 Other specified noninflammatory disorders of vagina: Secondary | ICD-10-CM | POA: Insufficient documentation

## 2016-12-29 DIAGNOSIS — Z3A30 30 weeks gestation of pregnancy: Secondary | ICD-10-CM | POA: Insufficient documentation

## 2016-12-29 DIAGNOSIS — O26893 Other specified pregnancy related conditions, third trimester: Secondary | ICD-10-CM | POA: Diagnosis present

## 2016-12-29 DIAGNOSIS — B373 Candidiasis of vulva and vagina: Secondary | ICD-10-CM

## 2016-12-29 DIAGNOSIS — B3731 Acute candidiasis of vulva and vagina: Secondary | ICD-10-CM

## 2016-12-29 MED ORDER — FLUCONAZOLE 200 MG PO TABS: 200.0000 mg | ORAL_TABLET | Freq: Every day | ORAL | 0 refills | Status: AC

## 2016-12-29 NOTE — MAU Note (Signed)
Pt presents with c/o yellow vaginal discharge and vaginal itching that began Wednesday.  Pt also c/o burning during urination.

## 2016-12-29 NOTE — MAU Provider Note (Signed)
History   G2P0010 @ 30.6 wks in with c/o vaginal itching and burning since Wednesday   CSN: [id number]  Arrival date & time 12/29/16  1727   None     Chief Complaint  Patient presents with  . Vaginal Discharge  . Vaginal Irritation    HPI  Past Medical History:  Diagnosis Date  . Medical history non-contributory     Past Surgical History:  Procedure Laterality Date  . WISDOM TOOTH EXTRACTION      Family History  Problem Relation Age of Onset  . Diabetes Maternal Grandmother     Social History  Substance Use Topics  . Smoking status: Former Smoker    Packs/day: 0.20    Types: Cigarettes, Cigars    Quit date: 12/16/2015  . Smokeless tobacco: Never Used  . Alcohol use No    OB History    Gravida Para Term Preterm AB Living   2       1 0   SAB TAB Ectopic Multiple Live Births     1     0      Review of Systems  Constitutional: Negative.   HENT: Negative.   Eyes: Negative.   Respiratory: Negative.   Cardiovascular: Negative.   Gastrointestinal: Negative.   Endocrine: Negative.   Genitourinary: Positive for vaginal discharge.  Musculoskeletal: Negative.   Skin: Negative.     Allergies  Patient has no known allergies.  Home Medications   Current Outpatient Rx  . Order #: [id number] Class: Normal    BP 111/62 (BP Location: Left Arm)   Pulse 91   Temp 98.2 F (36.8 C) (Oral)   Resp 20   LMP 05/10/2016 (Approximate)   Physical Exam  Constitutional: She is oriented to person, place, and time. She appears well-developed and well-nourished.  HENT:  Head: Normocephalic.  Eyes: Pupils are equal, round, and reactive to light.  Cardiovascular: Normal rate, regular rhythm, normal heart sounds and intact distal pulses.   Pulmonary/Chest: Effort normal and breath sounds normal.  Abdominal: Soft. Bowel sounds are normal.  Genitourinary: Vaginal discharge found.  Musculoskeletal: Normal range of motion.  Neurological: She is alert and oriented to person,  place, and time. She has normal reflexes.  Skin: Skin is warm and dry.  Psychiatric: She has a normal mood and affect. Her behavior is normal. Judgment and thought content normal.    MAU Course  Procedures (including critical care time)  Labs Reviewed  WET PREP, GENITAL  GC/CHLAMYDIA PROBE AMP ([place]) NOT AT [facility]   No results found.   1. Yeast vaginitis       MDM  Wet prep TNTC yeast. GC and chla done and tp lab. FHR pattern reassuring, no uc's. Will treat with diflucan and d/c home.

## 2016-12-29 NOTE — Discharge Instructions (Signed)

## 2016-12-29 NOTE — MAU Note (Signed)
Urine in lab 

## 2017-01-01 LAB — GC/CHLAMYDIA PROBE AMP (~~LOC~~) NOT AT ARMC
CHLAMYDIA, DNA PROBE: NEGATIVE
Neisseria Gonorrhea: NEGATIVE

## 2017-01-02 ENCOUNTER — Telehealth: Payer: Self-pay | Admitting: *Deleted

## 2017-01-02 NOTE — Telephone Encounter (Addendum)
-----   Message from Waldemar Dickens, MD sent at 12/27/2016  3:35 PM EDT ----- Please treat per protocl  5/30  1122  Per chart review, pt had visit to MAU on 5/26 and was given Rx for +vaginal yeast.  I called pt and she confirmed that she is taking the medication as directed. I provided the results of negative GC/Chlamydia test as well. Pt voiced understanding.

## 2017-01-06 ENCOUNTER — Inpatient Hospital Stay (HOSPITAL_COMMUNITY)
Admission: AD | Admit: 2017-01-06 | Discharge: 2017-01-06 | Disposition: A | Discharge status: Home / Self Care | Payer: Medicaid Other | Source: Ambulatory Visit | Attending: Obstetrics and Gynecology | Admitting: Obstetrics and Gynecology

## 2017-01-06 ENCOUNTER — Encounter (HOSPITAL_COMMUNITY): Payer: Self-pay | Admitting: *Deleted

## 2017-01-06 DIAGNOSIS — R55 Syncope and collapse: Secondary | ICD-10-CM | POA: Diagnosis not present

## 2017-01-06 DIAGNOSIS — Z3A32 32 weeks gestation of pregnancy: Secondary | ICD-10-CM | POA: Insufficient documentation

## 2017-01-06 DIAGNOSIS — O9989 Other specified diseases and conditions complicating pregnancy, childbirth and the puerperium: Secondary | ICD-10-CM

## 2017-01-06 DIAGNOSIS — Z87891 Personal history of nicotine dependence: Secondary | ICD-10-CM | POA: Insufficient documentation

## 2017-01-06 DIAGNOSIS — O26893 Other specified pregnancy related conditions, third trimester: Secondary | ICD-10-CM | POA: Insufficient documentation

## 2017-01-06 MED ORDER — LACTATED RINGERS IV BOLUS (SEPSIS)
1000.0000 mL | Freq: Once | INTRAVENOUS | Status: AC
  Administered 2017-01-06: 1000 mL via INTRAVENOUS

## 2017-01-06 NOTE — MAU Note (Signed)
Was standing at work, became very hot.  Only had one glass of oj to drink today. Started seeing flashed of light,  And things started to go black.  Immediately sat down.  When when went to get up, thing started spinning - so she laid down.  Co-workers called EMS.  Pt was orthostatic for EMS, started an IV, pt received a 500cc bolus NS.  States is feeling a little bit better

## 2017-01-06 NOTE — MAU Provider Note (Signed)
History     CSN: [id number]  Arrival date and time: 01/06/17 1549   First Provider Initiated Contact with Patient 01/06/17 1603      Chief Complaint  Patient presents with  . Near Syncope   HPI Tricia Campbell 26 y.o. [redacted]w[redacted]d  Comes to MAU today by EMS as she had a near syncopal episode while at work today.  Has had a mild cold for 3 days and has nasal stuffiness.  Ate a sausage biscuit and went to work.  Work was especially hot today - works at [employer].  She got very hot and was sweating.  Had spots come in her vision and felt very bad.  Has fainted before she was pregnant and thought she was going to faint so she laid down in the floor and her work called EMS.  EMS reported low BP and slight tachycardia and started IVF.  Goes for prenatal care in the clinic here at [facility].  Next appointment is Thursday.  OB History    Gravida Para Term Preterm AB Living   2       1 0   SAB TAB Ectopic Multiple Live Births     1     0      History reviewed. No pertinent past medical history.  Past Surgical History:  Procedure Laterality Date  . WISDOM TOOTH EXTRACTION      Family History  Problem Relation Age of Onset  . Asthma Neg Hx   . Diabetes Neg Hx   . Heart disease Neg Hx   . Hypertension Neg Hx   . Kidney disease Neg Hx   . Stroke Neg Hx     Social History  Substance Use Topics  . Smoking status: Former Smoker    Packs/day: 0.20    Types: Cigars    Quit date: 12/16/2015  . Smokeless tobacco: Never Used  . Alcohol use Yes     Comment: occ, not when preg    Allergies: No Known Allergies  Prescriptions Prior to Admission  Medication Sig Dispense Refill Last Dose  . fluconazole (DIFLUCAN) 100 MG tablet Take 100 mg by mouth daily.   01/06/2017 at Unknown time  . Prenatal MV-Min-FA-Omega-3 (PRENATAL GUMMIES/DHA & FA) 0.4-32.5 MG CHEW Chew 2 each by mouth daily.   01/06/2017 at Unknown time    Review of Systems  Constitutional: Negative for fever.  HENT:  Positive for congestion, postnasal drip and sore throat. Negative for sinus pressure and trouble swallowing.        Sore throat is improving.  Gastrointestinal: Negative for abdominal pain, nausea and vomiting.  Genitourinary: Negative for vaginal bleeding.       Baby moving well.  Neurological: Positive for light-headedness. Negative for dizziness and headaches.       Near syncope   Physical Exam   Blood pressure 111/64, pulse 81, temperature 98.1 F (36.7 C), temperature source Oral, resp. rate 16, last menstrual period 05/10/2016, SpO2 100 %.  Physical Exam  Nursing note and vitals reviewed. Constitutional: She is oriented to person, place, and time. She appears well-developed and well-nourished.  Alert and oriented.  Discussing physical symptoms with ease.  HENT:  Head: Normocephalic.  Eyes: EOM are normal.  Neck: Neck supple.  Cardiovascular: Normal rate.   Respiratory: Effort normal.  O2 Sat 99-100%  GI: Soft. There is no tenderness.  Fetal monitor - FHT baseline is 140 with moderate variability and 15x15 accels noted - Reactive strip.  No contractions.  Musculoskeletal: Normal range of motion.  Neurological: She is alert and oriented to person, place, and time.  Skin: Skin is warm and dry.  Psychiatric: She has a normal mood and affect.    MAU Course  Procedures IVF 500 cc infused and 1000CC LR hung.  After 1000cc infused client reports she is feeling much better.  MDM Had 1000 cc of LR IVF and client is feeing much better.  Vital signs stable and able to walk to the bathroom independently.  Ate some crackers and peanut butter here.  Will send home.  Advised to rest at home this evening and may return to work tomorrow.  Discussed importance of appropriate fluid intake and eating small amounts every 3 hours for good energy level.  HGB in May 2018 was 11.2  Assessment and Plan  Near syncope in third trimester  Plan Drink appropriate fluids. Eat every 3 hours  especially while at work. Keep your appointment in the office. Call if you are not doing well. Declines medication for cold symptoms - does not like to take medication and symptoms are improving.  Tricia Campbell L Tricia Campbell 01/06/2017, 4:54 PM

## 2017-01-06 NOTE — Discharge Instructions (Signed)
Drink at least 8 8-oz glasses of water every day. Take Tylenol 325 mg 2 tablets by mouth every 4 hours if needed for pain. Keep your appointments in the office. Eat a small amount every 3 hours to avoid getting weak. Call your doctor if this happens to you again.

## 2017-01-10 ENCOUNTER — Ambulatory Visit (INDEPENDENT_AMBULATORY_CARE_PROVIDER_SITE_OTHER): Payer: Medicaid Other | Admitting: Medical

## 2017-01-10 DIAGNOSIS — Z3483 Encounter for supervision of other normal pregnancy, third trimester: Secondary | ICD-10-CM

## 2017-01-10 DIAGNOSIS — Z3481 Encounter for supervision of other normal pregnancy, first trimester: Secondary | ICD-10-CM

## 2017-01-10 NOTE — Progress Notes (Signed)
Educated pt on [place]

## 2017-01-10 NOTE — Patient Instructions (Signed)
Fetal Movement Counts °Patient Name: ________________________________________________ Patient Due Date: ____________________ °What is a fetal movement count? °A fetal movement count is the number of times that you feel your baby move during a certain amount of time. This may also be called a fetal kick count. A fetal movement count is recommended for every pregnant woman. You may be asked to start counting fetal movements as early as week 28 of your pregnancy. °Pay attention to when your baby is most active. You may notice your baby's sleep and wake cycles. You may also notice things that make your baby move more. You should do a fetal movement count: °· When your baby is normally most active. °· At the same time each day. ° °A good time to count movements is while you are resting, after having something to eat and drink. °How do I count fetal movements? °1. Find a quiet, comfortable area. Sit, or lie down on your side. °2. Write down the date, the start time and stop time, and the number of movements that you felt between those two times. Take this information with you to your health care visits. °3. For 2 hours, count kicks, flutters, swishes, rolls, and jabs. You should feel at least 10 movements during 2 hours. °4. You may stop counting after you have felt 10 movements. °5. If you do not feel 10 movements in 2 hours, have something to eat and drink. Then, keep resting and counting for 1 hour. If you feel at least 4 movements during that hour, you may stop counting. °Contact a health care provider if: °· You feel fewer than 4 movements in 2 hours. °· Your baby is not moving like he or she usually does. °Date: ____________ Start time: ____________ Stop time: ____________ Movements: ____________ °Date: ____________ Start time: ____________ Stop time: ____________ Movements: ____________ °Date: ____________ Start time: ____________ Stop time: ____________ Movements: ____________ °Date: ____________ Start time:  ____________ Stop time: ____________ Movements: ____________ °Date: ____________ Start time: ____________ Stop time: ____________ Movements: ____________ °Date: ____________ Start time: ____________ Stop time: ____________ Movements: ____________ °Date: ____________ Start time: ____________ Stop time: ____________ Movements: ____________ °Date: ____________ Start time: ____________ Stop time: ____________ Movements: ____________ °Date: ____________ Start time: ____________ Stop time: ____________ Movements: ____________ °This information is not intended to replace advice given to you by your health care provider. Make sure you discuss any questions you have with your health care provider. °Document Released: 08/22/2006 Document Revised: 03/21/2016 Document Reviewed: 09/01/2015 °Elsevier Interactive Patient Education © 2018 [employer] °Braxton Hicks Contractions °Contractions of the uterus can occur throughout pregnancy, but they are not always a sign that you are in labor. You may have practice contractions called Braxton Hicks contractions. These false labor contractions are sometimes confused with true labor. °What are Braxton Hicks contractions? °Braxton Hicks contractions are tightening movements that occur in the muscles of the uterus before labor. Unlike true labor contractions, these contractions do not result in opening (dilation) and thinning of the cervix. Toward the end of pregnancy (32-34 weeks), Braxton Hicks contractions can happen more often and may become stronger. These contractions are sometimes difficult to tell apart from true labor because they can be very uncomfortable. You should not feel embarrassed if you go to the hospital with false labor. °Sometimes, the only way to tell if you are in true labor is for your health care provider to look for changes in the cervix. The health care provider will do a physical exam and may monitor your contractions. If   you are not in true labor, the exam  should show that your cervix is not dilating and your water has not broken. °If there are no prenatal problems or other health problems associated with your pregnancy, it is completely safe for you to be sent home with false labor. You may continue to have Braxton Hicks contractions until you go into true labor. °How can I tell the difference between true labor and false labor? °· Differences °? False labor °? Contractions last 30-70 seconds.: Contractions are usually shorter and not as strong as true labor contractions. °? Contractions become very regular.: Contractions are usually irregular. °? Discomfort is usually felt in the top of the uterus, and it spreads to the lower abdomen and low back.: Contractions are often felt in the front of the lower abdomen and in the groin. °? Contractions do not go away with walking.: Contractions may go away when you walk around or change positions while lying down. °? Contractions usually become more intense and increase in frequency.: Contractions get weaker and are shorter-lasting as time goes on. °? The cervix dilates and gets thinner.: The cervix usually does not dilate or become thin. °Follow these instructions at home: °· Take over-the-counter and prescription medicines only as told by your health care provider. °· Keep up with your usual exercises and follow other instructions from your health care provider. °· Eat and drink lightly if you think you are going into labor. °· If Braxton Hicks contractions are making you uncomfortable: °? Change your position from lying down or resting to walking, or change from walking to resting. °? Sit and rest in a tub of warm water. °? Drink enough fluid to keep your urine clear or pale yellow. Dehydration may cause these contractions. °? Do slow and deep breathing several times an hour. °· Keep all follow-up prenatal visits as told by your health care provider. This is important. °Contact a health care provider if: °· You have a  fever. °· You have continuous pain in your abdomen. °Get help right away if: °· Your contractions become stronger, more regular, and closer together. °· You have fluid leaking or gushing from your vagina. °· You pass blood-tinged mucus (bloody show). °· You have bleeding from your vagina. °· You have low back pain that you never had before. °· You feel your baby’s head pushing down and causing pelvic pressure. °· Your baby is not moving inside you as much as it used to. °Summary °· Contractions that occur before labor are called Braxton Hicks contractions, false labor, or practice contractions. °· Braxton Hicks contractions are usually shorter, weaker, farther apart, and less regular than true labor contractions. True labor contractions usually become progressively stronger and regular and they become more frequent. °· Manage discomfort from Braxton Hicks contractions by changing position, resting in a warm bath, drinking plenty of water, or practicing deep breathing. °This information is not intended to replace advice given to you by your health care provider. Make sure you discuss any questions you have with your health care provider. °Document Released: 07/23/2005 Document Revised: 06/11/2016 Document Reviewed: 06/11/2016 °Elsevier Interactive Patient Education © 2017 [employer] ° °

## 2017-01-10 NOTE — Progress Notes (Signed)
   PRENATAL VISIT NOTE  Subjective:  Tricia Campbell is a 26 y.o. G2P0010 at [redacted]w[redacted]d being seen today for ongoing prenatal care.  She is currently monitored for the following issues for this low-risk pregnancy and has Supervision of normal pregnancy in first trimester and Marijuana use on her problem list.  Patient reports no complaints.  Contractions: Not present. Vag. Bleeding: None.  Movement: Present. Denies leaking of fluid.   The following portions of the patient's history were reviewed and updated as appropriate: allergies, current medications, past family history, past medical history, past social history, past surgical history and problem list. Problem list updated.  Objective:   Vitals:   01/10/17 0926  BP: 113/63  Pulse: 86  Weight: 138 lb 9.6 oz (62.9 kg)    Fetal Status: Fetal Heart Rate (bpm): 147   Movement: Present     General:  Alert, oriented and cooperative. Patient is in no acute distress.  Skin: Skin is warm and dry. No rash noted.   Cardiovascular: Normal heart rate noted  Respiratory: Normal respiratory effort, no problems with respiration noted  Abdomen: Soft, gravid, appropriate for gestational age. Pain/Pressure: Present     Pelvic:  Cervical exam deferred        Extremities: Normal range of motion.  Edema: None  Mental Status: Normal mood and affect. Normal behavior. Normal judgment and thought content.   Assessment and Plan:  Pregnancy: G2P0010 at [redacted]w[redacted]d  1. Encounter for supervision of other normal pregnancy in first trimester - Doing well - Patient had a lot of questions about breastfeeding and pumping and the need to return to work after 4-6 weeks - Discussed benefits of breastfeeding and plans for pumping at work in detail  Preterm labor symptoms and general obstetric precautions including but not limited to vaginal bleeding, contractions, leaking of fluid and fetal movement were reviewed in detail with the patient. Please refer to After  Visit Summary for other counseling recommendations.  Return in about 2 weeks (around 01/24/2017) for LOB.   Kerry Hough, PA-C

## 2017-01-14 ENCOUNTER — Encounter: Payer: Self-pay | Admitting: Family Medicine

## 2017-01-24 ENCOUNTER — Ambulatory Visit (INDEPENDENT_AMBULATORY_CARE_PROVIDER_SITE_OTHER): Payer: Medicaid Other | Admitting: Advanced Practice Midwife

## 2017-01-24 VITALS — BP 124/79 | HR 90 | Wt 139.2 lb

## 2017-01-24 DIAGNOSIS — Z3481 Encounter for supervision of other normal pregnancy, first trimester: Secondary | ICD-10-CM

## 2017-01-24 NOTE — Patient Instructions (Signed)

## 2017-01-24 NOTE — Progress Notes (Signed)
   PRENATAL VISIT NOTE  Subjective:  Tricia Campbell is a 26 y.o. G2P0010 at [redacted]w[redacted]d being seen today for ongoing prenatal care.  She is currently monitored for the following issues for this low-risk pregnancy and has Supervision of normal pregnancy in first trimester and Marijuana use on her problem list.  Patient reports occasional contractions.  Contractions: Irritability. Vag. Bleeding: None.  Movement: Present. Denies leaking of fluid.   The following portions of the patient's history were reviewed and updated as appropriate: allergies, current medications, past family history, past medical history, past social history, past surgical history and problem list. Problem list updated.  Objective:   Vitals:   01/24/17 1022  BP: 124/79  Pulse: 90  Weight: 139 lb 3.2 oz (63.1 kg)    Fetal Status: Fetal Heart Rate (bpm): 146 Fundal Height: 34 cm Movement: Present     General:  Alert, oriented and cooperative. Patient is in no acute distress.  Skin: Skin is warm and dry. No rash noted.   Cardiovascular: Normal heart rate noted  Respiratory: Normal respiratory effort, no problems with respiration noted  Abdomen: Soft, gravid, appropriate for gestational age. Pain/Pressure: Present     Pelvic:  Cervical exam Offered, declined        Extremities: Normal range of motion.  Edema: None  Mental Status: Normal mood and affect. Normal behavior. Normal judgment and thought content.   Assessment and Plan:  Pregnancy: G2P0010 at [redacted]w[redacted]d  There are no diagnoses linked to this encounter. Preterm labor symptoms and general obstetric precautions including but not limited to vaginal bleeding, contractions, leaking of fluid and fetal movement were reviewed in detail with the patient. Please refer to After Visit Summary for other counseling recommendations.  Return in about 2 weeks (around 02/07/2017) for ROB/GBS.   Manya Silvas, CNM

## 2017-01-29 ENCOUNTER — Encounter (HOSPITAL_COMMUNITY): Payer: Self-pay | Admitting: *Deleted

## 2017-01-29 ENCOUNTER — Inpatient Hospital Stay (HOSPITAL_COMMUNITY)
Admission: AD | Admit: 2017-01-29 | Discharge: 2017-01-29 | Disposition: A | Discharge status: Home / Self Care | Payer: Medicaid Other | Source: Ambulatory Visit | Attending: Family Medicine | Admitting: Family Medicine

## 2017-01-29 DIAGNOSIS — B3731 Acute candidiasis of vulva and vagina: Secondary | ICD-10-CM

## 2017-01-29 DIAGNOSIS — Z3A35 35 weeks gestation of pregnancy: Secondary | ICD-10-CM | POA: Diagnosis not present

## 2017-01-29 DIAGNOSIS — O4703 False labor before 37 completed weeks of gestation, third trimester: Secondary | ICD-10-CM | POA: Diagnosis not present

## 2017-01-29 DIAGNOSIS — B373 Candidiasis of vulva and vagina: Secondary | ICD-10-CM

## 2017-01-29 DIAGNOSIS — O98813 Other maternal infectious and parasitic diseases complicating pregnancy, third trimester: Secondary | ICD-10-CM | POA: Insufficient documentation

## 2017-01-29 DIAGNOSIS — R102 Pelvic and perineal pain: Secondary | ICD-10-CM | POA: Insufficient documentation

## 2017-01-29 DIAGNOSIS — O26893 Other specified pregnancy related conditions, third trimester: Secondary | ICD-10-CM | POA: Diagnosis not present

## 2017-01-29 DIAGNOSIS — N898 Other specified noninflammatory disorders of vagina: Secondary | ICD-10-CM | POA: Diagnosis present

## 2017-01-29 DIAGNOSIS — Z87891 Personal history of nicotine dependence: Secondary | ICD-10-CM | POA: Insufficient documentation

## 2017-01-29 DIAGNOSIS — N949 Unspecified condition associated with female genital organs and menstrual cycle: Secondary | ICD-10-CM

## 2017-01-29 LAB — URINALYSIS, ROUTINE W REFLEX MICROSCOPIC
Bilirubin Urine: NEGATIVE
GLUCOSE, UA: NEGATIVE mg/dL
HGB URINE DIPSTICK: NEGATIVE
Ketones, ur: NEGATIVE mg/dL
NITRITE: NEGATIVE
PH: 7 (ref 5.0–8.0)
Protein, ur: NEGATIVE mg/dL
SPECIFIC GRAVITY, URINE: 1.009 (ref 1.005–1.030)

## 2017-01-29 LAB — WET PREP, GENITAL
Clue Cells Wet Prep HPF POC: NONE SEEN
SPERM: NONE SEEN
Trich, Wet Prep: NONE SEEN

## 2017-01-29 MED ORDER — FLUCONAZOLE 150 MG PO TABS: ORAL_TABLET | ORAL | 0 refills | Status: DC

## 2017-01-29 NOTE — MAU Note (Signed)
+  vaginal irritation Started 2 days White discharge   +vaginal pressure  Denies LOF or VB. +FM

## 2017-01-29 NOTE — Discharge Instructions (Signed)

## 2017-01-29 NOTE — MAU Provider Note (Signed)
History     CSN: [id number]  Arrival date and time: 01/29/17 1031   First Provider Initiated Contact with Patient 01/29/17 1054      Chief Complaint  Patient presents with  . vaginal irritation   HPI See Beharry is a 26 y.o. G2P0010 at [redacted]w[redacted]d who presents complaining of vaginal irritation for 2 days. She reports a small amount of thick white discharge also. She denies any pain or contractions. She denies any vaginal bleeding or leaking of fluid. Reports good fetal movement. She states she has had 2 previous yeast infections during the pregnancy and feels like this is another one.  She has not tried any treatments, nothing makes her symptoms better or worse. There are no associated symptoms.  OB History    Gravida Para Term Preterm AB Living   2       1 0   SAB TAB Ectopic Multiple Live Births     1     0      History reviewed. No pertinent past medical history.  Past Surgical History:  Procedure Laterality Date  . WISDOM TOOTH EXTRACTION      Family History  Problem Relation Age of Onset  . Asthma Neg Hx   . Diabetes Neg Hx   . Heart disease Neg Hx   . Hypertension Neg Hx   . Kidney disease Neg Hx   . Stroke Neg Hx     Social History  Substance Use Topics  . Smoking status: Former Smoker    Packs/day: 0.20    Types: Cigars    Quit date: 12/16/2015  . Smokeless tobacco: Never Used  . Alcohol use Yes     Comment: occ, not when preg    Allergies: No Known Allergies  No prescriptions prior to admission.    Review of Systems  Constitutional: Negative.   Respiratory: Negative.  Negative for shortness of breath.   Cardiovascular: Negative.  Negative for chest pain.  Gastrointestinal: Negative.  Negative for abdominal pain.  Genitourinary: Positive for vaginal discharge. Negative for dysuria and vaginal bleeding.  Neurological: Negative.    Physical Exam   Blood pressure 106/61, pulse 93, temperature 97.5 F (36.4 C), temperature source Oral,  resp. rate 17, weight 139 lb (63 kg), last menstrual period 05/10/2016, SpO2 100 %.  Physical Exam  Nursing note and vitals reviewed. Constitutional: She appears well-developed and well-nourished.  HENT:  Head: Normocephalic and atraumatic.  Eyes: Conjunctivae are normal. No scleral icterus.  Cardiovascular: Normal rate, regular rhythm and normal heart sounds.   Respiratory: Effort normal and breath sounds normal. No respiratory distress.  GI: Soft. She exhibits no distension. There is no tenderness.  Genitourinary: Vaginal discharge found.  Genitourinary Comments: Thick white discharge  Neurological: She is alert.  Skin: Skin is warm and dry.  Psychiatric: She has a normal mood and affect. Her behavior is normal. Judgment and thought content normal.   Fetal Tracing:  Baseline: 130bpm Variability: moderate Accelerations: 15x15 Decelerations: none  Toco: no contractions noted  Dilation: 1 Effacement (%): Thick Cervical Position: Posterior Station: Ballotable Exam by:: Haynes Bast SNM   MAU Course  Procedures Results for orders placed or performed during the hospital encounter of 01/29/17 (from the past 24 hour(s))  Urinalysis, Routine w reflex microscopic     Status: Abnormal   Collection Time: 01/29/17 10:34 AM  Result Value Ref Range   Color, Urine YELLOW YELLOW   APPearance CLEAR CLEAR   Specific Gravity, Urine 1.009 1.005 - 1.030  pH 7.0 5.0 - 8.0   Glucose, UA NEGATIVE NEGATIVE mg/dL   Hgb urine dipstick NEGATIVE NEGATIVE   Bilirubin Urine NEGATIVE NEGATIVE   Ketones, ur NEGATIVE NEGATIVE mg/dL   Protein, ur NEGATIVE NEGATIVE mg/dL   Nitrite NEGATIVE NEGATIVE   Leukocytes, UA LARGE (A) NEGATIVE   RBC / HPF 0-5 0 - 5 RBC/hpf   WBC, UA 6-30 0 - 5 WBC/hpf   Bacteria, UA MANY (A) NONE SEEN   Squamous Epithelial / LPF 6-30 (A) NONE SEEN   Mucous PRESENT   Wet prep, genital     Status: Abnormal   Collection Time: 01/29/17 11:15 AM  Result Value Ref Range   Yeast  Wet Prep HPF POC PRESENT (A) NONE SEEN   Trich, Wet Prep NONE SEEN NONE SEEN   Clue Cells Wet Prep HPF POC NONE SEEN NONE SEEN   WBC, Wet Prep HPF POC MANY (A) NONE SEEN   Sperm NONE SEEN    MDM UA Wet prep and gc/chlamydia  Assessment and Plan   1. Vaginal yeast infection   2. Pelvic pressure in pregnancy, antepartum, third trimester    -Discharge patient home in stable condition -Prescription for diflucan for yeast infection sent to pharmacy -Follow up at [facility] clinic for regular prenatal visit -Reviewed labor precautions with patient -Encouraged to return here or to other Urgent Care/ED if she develops worsening of symptoms, increase in pain, fever, or other concerning symptoms.   Len Blalock SNM 01/29/2017, 11:34 AM   Follow-up [facility] Follow up.   Specialty:  Obstetrics and Gynecology Why:  As scheduled, return to MAU with signs of labor or emergencies Contact information: [place] [phone]           I have seen this patient, performed the physical exam, and agree with the above midwife student's note.  [place]IR[place], [place] Certified Nurse-Midwife

## 2017-01-30 LAB — GC/CHLAMYDIA PROBE AMP (~~LOC~~) NOT AT ARMC
Chlamydia: NEGATIVE
Neisseria Gonorrhea: NEGATIVE

## 2017-01-31 ENCOUNTER — Encounter: Payer: Medicaid Other | Admitting: Medical

## 2017-02-01 ENCOUNTER — Encounter (HOSPITAL_COMMUNITY): Payer: Self-pay | Admitting: *Deleted

## 2017-02-01 ENCOUNTER — Inpatient Hospital Stay (HOSPITAL_COMMUNITY)
Admission: AD | Admit: 2017-02-01 | Discharge: 2017-02-01 | Disposition: A | Discharge status: Home / Self Care | Payer: Medicaid Other | Source: Ambulatory Visit | Attending: Obstetrics & Gynecology | Admitting: Obstetrics & Gynecology

## 2017-02-01 DIAGNOSIS — Z87891 Personal history of nicotine dependence: Secondary | ICD-10-CM | POA: Diagnosis not present

## 2017-02-01 DIAGNOSIS — O4703 False labor before 37 completed weeks of gestation, third trimester: Secondary | ICD-10-CM | POA: Diagnosis not present

## 2017-02-01 DIAGNOSIS — Z0371 Encounter for suspected problem with amniotic cavity and membrane ruled out: Secondary | ICD-10-CM

## 2017-02-01 DIAGNOSIS — Z3A35 35 weeks gestation of pregnancy: Secondary | ICD-10-CM | POA: Insufficient documentation

## 2017-02-01 DIAGNOSIS — N898 Other specified noninflammatory disorders of vagina: Secondary | ICD-10-CM | POA: Diagnosis present

## 2017-02-01 DIAGNOSIS — Z9889 Other specified postprocedural states: Secondary | ICD-10-CM | POA: Insufficient documentation

## 2017-02-01 LAB — POCT FERN TEST: POCT FERN TEST: NEGATIVE

## 2017-02-01 NOTE — Discharge Instructions (Signed)
Third Trimester of Pregnancy The third trimester is from week 28 through week 40 (months 7 through 9). The third trimester is a time when the unborn baby (fetus) is growing rapidly. At the end of the ninth month, the fetus is about 20 inches in length and weighs 6-10 pounds. Body changes during your third trimester Your body will continue to go through many changes during pregnancy. The changes vary from woman to woman. During the third trimester:  Your weight will continue to increase. You can expect to gain 25-35 pounds (11-16 kg) by the end of the pregnancy.  You may begin to get stretch marks on your hips, abdomen, and breasts.  You may urinate more often because the fetus is moving lower into your pelvis and pressing on your bladder.  You may develop or continue to have heartburn. This is caused by increased hormones that slow down muscles in the digestive tract.  You may develop or continue to have constipation because increased hormones slow digestion and cause the muscles that push waste through your intestines to relax.  You may develop hemorrhoids. These are swollen veins (varicose veins) in the rectum that can itch or be painful.  You may develop swollen, bulging veins (varicose veins) in your legs.  You may have increased body aches in the pelvis, back, or thighs. This is due to weight gain and increased hormones that are relaxing your joints.  You may have changes in your hair. These can include thickening of your hair, rapid growth, and changes in texture. Some women also have hair loss during or after pregnancy, or hair that feels dry or thin. Your hair will most likely return to normal after your baby is born.  Your breasts will continue to grow and they will continue to become tender. A yellow fluid (colostrum) may leak from your breasts. This is the first milk you are producing for your baby.  Your belly button may stick out.  You may notice more swelling in your hands,  face, or ankles.  You may have increased tingling or numbness in your hands, arms, and legs. The skin on your belly may also feel numb.  You may feel short of breath because of your expanding uterus.  You may have more problems sleeping. This can be caused by the size of your belly, increased need to urinate, and an increase in your body's metabolism.  You may notice the fetus "dropping," or moving lower in your abdomen (lightening).  You may have increased vaginal discharge.  You may notice your joints feel loose and you may have pain around your pelvic bone.  What to expect at prenatal visits You will have prenatal exams every 2 weeks until week 36. Then you will have weekly prenatal exams. During a routine prenatal visit:  You will be weighed to make sure you and the baby are growing normally.  Your blood pressure will be taken.  Your abdomen will be measured to track your baby's growth.  The fetal heartbeat will be listened to.  Any test results from the previous visit will be discussed.  You may have a cervical check near your due date to see if your cervix has softened or thinned (effaced).  You will be tested for Group B streptococcus. This happens between 35 and 37 weeks.  Your health care provider may ask you:  What your birth plan is.  How you are feeling.  If you are feeling the baby move.  If you have had   any abnormal symptoms, such as leaking fluid, bleeding, severe headaches, or abdominal cramping.  If you are using any tobacco products, including cigarettes, chewing tobacco, and electronic cigarettes.  If you have any questions.  Other tests or screenings that may be performed during your third trimester include:  Blood tests that check for low iron levels (anemia).  Fetal testing to check the health, activity level, and growth of the fetus. Testing is done if you have certain medical conditions or if there are problems during the  pregnancy.  Nonstress test (NST). This test checks the health of your baby to make sure there are no signs of problems, such as the baby not getting enough oxygen. During this test, a belt is placed around your belly. The baby is made to move, and its heart rate is monitored during movement.  What is false labor? False labor is a condition in which you feel small, irregular tightenings of the muscles in the womb (contractions) that usually go away with rest, changing position, or drinking water. These are called Braxton Hicks contractions. Contractions may last for hours, days, or even weeks before true labor sets in. If contractions come at regular intervals, become more frequent, increase in intensity, or become painful, you should see your health care provider. What are the signs of labor?  Abdominal cramps.  Regular contractions that start at 10 minutes apart and become stronger and more frequent with time.  Contractions that start on the top of the uterus and spread down to the lower abdomen and back.  Increased pelvic pressure and dull back pain.  A watery or bloody mucus discharge that comes from the vagina.  Leaking of amniotic fluid. This is also known as your "water breaking." It could be a slow trickle or a gush. Let your health care provider know if it has a color or strange odor. If you have any of these signs, call your health care provider right away, even if it is before your due date. Follow these instructions at home: Medicines  Follow your health care provider's instructions regarding medicine use. Specific medicines may be either safe or unsafe to take during pregnancy.  Take a prenatal vitamin that contains at least 600 micrograms (mcg) of folic acid.  If you develop constipation, try taking a stool softener if your health care provider approves. Eating and drinking  Eat a balanced diet that includes fresh fruits and vegetables, whole grains, good sources of protein  such as meat, eggs, or tofu, and low-fat dairy. Your health care provider will help you determine the amount of weight gain that is right for you.  Avoid raw meat and uncooked cheese. These carry germs that can cause birth defects in the baby.  If you have low calcium intake from food, talk to your health care provider about whether you should take a daily calcium supplement.  Eat four or five small meals rather than three large meals a day.  Limit foods that are high in fat and processed sugars, such as fried and sweet foods.  To prevent constipation: ? Drink enough fluid to keep your urine clear or pale yellow. ? Eat foods that are high in fiber, such as fresh fruits and vegetables, whole grains, and beans. Activity  Exercise only as directed by your health care provider. Most women can continue their usual exercise routine during pregnancy. Try to exercise for 30 minutes at least 5 days a week. Stop exercising if you experience uterine contractions.  Avoid heavy   lifting.  Do not exercise in extreme heat or humidity, or at high altitudes.  Wear low-heel, comfortable shoes.  Practice good posture.  You may continue to have sex unless your health care provider tells you otherwise. Relieving pain and discomfort  Take frequent breaks and rest with your legs elevated if you have leg cramps or low back pain.  Take warm sitz baths to soothe any pain or discomfort caused by hemorrhoids. Use hemorrhoid cream if your health care provider approves.  Wear a good support bra to prevent discomfort from breast tenderness.  If you develop varicose veins: ? Wear support pantyhose or compression stockings as told by your healthcare provider. ? Elevate your feet for 15 minutes, 3-4 times a day. Prenatal care  Write down your questions. Take them to your prenatal visits.  Keep all your prenatal visits as told by your health care provider. This is important. Safety  Wear your seat belt at  all times when driving.  Make a list of emergency phone numbers, including numbers for family, friends, the hospital, and police and fire departments. General instructions  Avoid cat litter boxes and soil used by cats. These carry germs that can cause birth defects in the baby. If you have a cat, ask someone to clean the litter box for you.  Do not travel far distances unless it is absolutely necessary and only with the approval of your health care provider.  Do not use hot tubs, steam rooms, or saunas.  Do not drink alcohol.  Do not use any products that contain nicotine or tobacco, such as cigarettes and e-cigarettes. If you need help quitting, ask your health care provider.  Do not use any medicinal herbs or unprescribed drugs. These chemicals affect the formation and growth of the baby.  Do not douche or use tampons or scented sanitary pads.  Do not cross your legs for long periods of time.  To prepare for the arrival of your baby: ? Take prenatal classes to understand, practice, and ask questions about labor and delivery. ? Make a trial run to the hospital. ? Visit the hospital and tour the maternity area. ? Arrange for maternity or paternity leave through employers. ? Arrange for family and friends to take care of pets while you are in the hospital. ? Purchase a rear-facing car seat and make sure you know how to install it in your car. ? Pack your hospital bag. ? Prepare the baby's nursery. Make sure to remove all pillows and stuffed animals from the baby's crib to prevent suffocation.  Visit your dentist if you have not gone during your pregnancy. Use a soft toothbrush to brush your teeth and be gentle when you floss. Contact a health care provider if:  You are unsure if you are in labor or if your water has broken.  You become dizzy.  You have mild pelvic cramps, pelvic pressure, or nagging pain in your abdominal area.  You have lower back pain.  You have persistent  nausea, vomiting, or diarrhea.  You have an unusual or bad smelling vaginal discharge.  You have pain when you urinate. Get help right away if:  Your water breaks before 37 weeks.  You have regular contractions less than 5 minutes apart before 37 weeks.  You have a fever.  You are leaking fluid from your vagina.  You have spotting or bleeding from your vagina.  You have severe abdominal pain or cramping.  You have rapid weight loss or weight gain.    You have shortness of breath with chest pain.  You notice sudden or extreme swelling of your face, hands, ankles, feet, or legs.  Your baby makes fewer than 10 movements in 2 hours.  You have severe headaches that do not go away when you take medicine.  You have vision changes. Summary  The third trimester is from week 28 through week 40, months 7 through 9. The third trimester is a time when the unborn baby (fetus) is growing rapidly.  During the third trimester, your discomfort may increase as you and your baby continue to gain weight. You may have abdominal, leg, and back pain, sleeping problems, and an increased need to urinate.  During the third trimester your breasts will keep growing and they will continue to become tender. A yellow fluid (colostrum) may leak from your breasts. This is the first milk you are producing for your baby.  False labor is a condition in which you feel small, irregular tightenings of the muscles in the womb (contractions) that eventually go away. These are called Braxton Hicks contractions. Contractions may last for hours, days, or even weeks before true labor sets in.  Signs of labor can include: abdominal cramps; regular contractions that start at 10 minutes apart and become stronger and more frequent with time; watery or bloody mucus discharge that comes from the vagina; increased pelvic pressure and dull back pain; and leaking of amniotic fluid. This information is not intended to replace advice  given to you by your health care provider. Make sure you discuss any questions you have with your health care provider. Document Released: 07/17/2001 Document Revised: 12/29/2015 Document Reviewed: 09/23/2012 Elsevier Interactive Patient Education  2017 [employer]  

## 2017-02-01 NOTE — MAU Provider Note (Signed)
History     CSN: [id number]  Arrival date and time: 02/01/17 0125   First Provider Initiated Contact with Patient 02/01/17 0201      Chief Complaint  Patient presents with  . Rupture of Membranes    Possible SROM at 0100  . Vaginal Discharge   Tricia Campbell is a 26 y.o. G2P0010 at [redacted]w[redacted]d who presents today with leaking of fluid. She reports that around 0100 she had a gush or fluid. She has not had any leaking since. She reports some pelvic pressure, but denies any pain or contractions. She reports normal fetal movement.    Vaginal Discharge  The patient's primary symptoms include vaginal discharge. This is a new problem. The current episode started today (around 0100 ). The problem has been unchanged. The patient is experiencing no pain. She is pregnant. Pertinent negatives include no chills, dysuria, fever, frequency, nausea, urgency or vomiting. The vaginal discharge was watery. There has been no bleeding. Nothing aggravates the symptoms. She has tried nothing for the symptoms.    History reviewed. No pertinent past medical history.  Past Surgical History:  Procedure Laterality Date  . WISDOM TOOTH EXTRACTION      Family History  Problem Relation Age of Onset  . Asthma Neg Hx   . Diabetes Neg Hx   . Heart disease Neg Hx   . Hypertension Neg Hx   . Kidney disease Neg Hx   . Stroke Neg Hx     Social History  Substance Use Topics  . Smoking status: Former Smoker    Packs/day: 0.20    Types: Cigars    Quit date: 12/16/2015  . Smokeless tobacco: Never Used  . Alcohol use Yes     Comment: occ, not when preg    Allergies: No Known Allergies  Prescriptions Prior to Admission  Medication Sig Dispense Refill Last Dose  . fluconazole (DIFLUCAN) 150 MG tablet Take one tablet now and one in 3 days. 2 tablet 0 Past Week at Unknown time  . Prenatal MV-Min-FA-Omega-3 (PRENATAL GUMMIES/DHA & FA) 0.4-32.5 MG CHEW Chew 2 each by mouth daily.   02/01/2017 at Unknown time     Review of Systems  Constitutional: Negative for chills and fever.  Gastrointestinal: Negative for nausea and vomiting.  Genitourinary: Positive for vaginal discharge. Negative for dysuria, frequency and urgency.   Physical Exam   Blood pressure 110/63, pulse 84, temperature 97.8 F (36.6 C), resp. rate 17, last menstrual period 05/10/2016, SpO2 99 %.  Physical Exam  Nursing note and vitals reviewed. Constitutional: She is oriented to person, place, and time. She appears well-developed and well-nourished. No distress.  HENT:  Head: Normocephalic.  Cardiovascular: Normal rate.   Respiratory: Effort normal.  GI: Soft. There is no tenderness. There is no rebound.  Genitourinary:  Genitourinary Comments:  External: no lesion Vagina: small amount of white discharge. No pooling  Cervix: pink, smooth, no fluid seen with valsalva. FT/thick/-2  Uterus: AGA   Neurological: She is alert and oriented to person, place, and time.  Skin: Skin is warm and dry.  Psychiatric: She has a normal mood and affect.    FHT: 145, moderate with 15x15 accels, no decels Toco: no UCs  [place]: NEGATIVE MAU Course  Procedures  MDM   Assessment and Plan   1. Encounter for suspected premature rupture of membranes, with rupture of membranes not found   2. False labor before 37 completed weeks of gestation in third trimester   3. [redacted] weeks gestation of  pregnancy    DC home Comfort measures reviewed  3rd Trimester precautions  PTL precautions  Fetal kick counts RX: none  Return to MAU as needed FU with OB as planned  [place] for [facility] Follow up.   Specialty:  Obstetrics and Gynecology Contact information: [place] 02/01/2017, 2:03 AM

## 2017-02-01 NOTE — MAU Note (Signed)
Pt. Here because of possible SROM at 0100 tonight, clear fluid. Positive for fetal movement, denies vaginal bleeding. Doppler FHR -166 bpm

## 2017-02-01 NOTE — MAU Note (Signed)
Pt reports that she was standing in the kitchen, felt pressure and then a large amount of clear fluid leaked out onto the floor. + FM. Denies Vag. Bleeding

## 2017-02-05 ENCOUNTER — Encounter: Payer: Self-pay | Admitting: Medical

## 2017-02-05 ENCOUNTER — Ambulatory Visit (INDEPENDENT_AMBULATORY_CARE_PROVIDER_SITE_OTHER): Payer: Medicaid Other | Admitting: Medical

## 2017-02-05 VITALS — BP 115/63 | HR 87 | Wt 140.9 lb

## 2017-02-05 DIAGNOSIS — Z3481 Encounter for supervision of other normal pregnancy, first trimester: Secondary | ICD-10-CM

## 2017-02-05 LAB — OB RESULTS CONSOLE GBS: GBS: NEGATIVE

## 2017-02-05 NOTE — Patient Instructions (Signed)
Fetal Movement Counts °Patient Name: ________________________________________________ Patient Due Date: ____________________ °What is a fetal movement count? °A fetal movement count is the number of times that you feel your baby move during a certain amount of time. This may also be called a fetal kick count. A fetal movement count is recommended for every pregnant woman. You may be asked to start counting fetal movements as early as week 28 of your pregnancy. °Pay attention to when your baby is most active. You may notice your baby's sleep and wake cycles. You may also notice things that make your baby move more. You should do a fetal movement count: °· When your baby is normally most active. °· At the same time each day. ° °A good time to count movements is while you are resting, after having something to eat and drink. °How do I count fetal movements? °1. Find a quiet, comfortable area. Sit, or lie down on your side. °2. Write down the date, the start time and stop time, and the number of movements that you felt between those two times. Take this information with you to your health care visits. °3. For 2 hours, count kicks, flutters, swishes, rolls, and jabs. You should feel at least 10 movements during 2 hours. °4. You may stop counting after you have felt 10 movements. °5. If you do not feel 10 movements in 2 hours, have something to eat and drink. Then, keep resting and counting for 1 hour. If you feel at least 4 movements during that hour, you may stop counting. °Contact a health care provider if: °· You feel fewer than 4 movements in 2 hours. °· Your baby is not moving like he or she usually does. °Date: ____________ Start time: ____________ Stop time: ____________ Movements: ____________ °Date: ____________ Start time: ____________ Stop time: ____________ Movements: ____________ °Date: ____________ Start time: ____________ Stop time: ____________ Movements: ____________ °Date: ____________ Start time:  ____________ Stop time: ____________ Movements: ____________ °Date: ____________ Start time: ____________ Stop time: ____________ Movements: ____________ °Date: ____________ Start time: ____________ Stop time: ____________ Movements: ____________ °Date: ____________ Start time: ____________ Stop time: ____________ Movements: ____________ °Date: ____________ Start time: ____________ Stop time: ____________ Movements: ____________ °Date: ____________ Start time: ____________ Stop time: ____________ Movements: ____________ °This information is not intended to replace advice given to you by your health care provider. Make sure you discuss any questions you have with your health care provider. °Document Released: 08/22/2006 Document Revised: 03/21/2016 Document Reviewed: 09/01/2015 °Elsevier Interactive Patient Education © 2018 [employer] °Braxton Hicks Contractions °Contractions of the uterus can occur throughout pregnancy, but they are not always a sign that you are in labor. You may have practice contractions called Braxton Hicks contractions. These false labor contractions are sometimes confused with true labor. °What are Braxton Hicks contractions? °Braxton Hicks contractions are tightening movements that occur in the muscles of the uterus before labor. Unlike true labor contractions, these contractions do not result in opening (dilation) and thinning of the cervix. Toward the end of pregnancy (32-34 weeks), Braxton Hicks contractions can happen more often and may become stronger. These contractions are sometimes difficult to tell apart from true labor because they can be very uncomfortable. You should not feel embarrassed if you go to the hospital with false labor. °Sometimes, the only way to tell if you are in true labor is for your health care provider to look for changes in the cervix. The health care provider will do a physical exam and may monitor your contractions. If   you are not in true labor, the exam  should show that your cervix is not dilating and your water has not broken. °If there are no prenatal problems or other health problems associated with your pregnancy, it is completely safe for you to be sent home with false labor. You may continue to have Braxton Hicks contractions until you go into true labor. °How can I tell the difference between true labor and false labor? °· Differences °? False labor °? Contractions last 30-70 seconds.: Contractions are usually shorter and not as strong as true labor contractions. °? Contractions become very regular.: Contractions are usually irregular. °? Discomfort is usually felt in the top of the uterus, and it spreads to the lower abdomen and low back.: Contractions are often felt in the front of the lower abdomen and in the groin. °? Contractions do not go away with walking.: Contractions may go away when you walk around or change positions while lying down. °? Contractions usually become more intense and increase in frequency.: Contractions get weaker and are shorter-lasting as time goes on. °? The cervix dilates and gets thinner.: The cervix usually does not dilate or become thin. °Follow these instructions at home: °· Take over-the-counter and prescription medicines only as told by your health care provider. °· Keep up with your usual exercises and follow other instructions from your health care provider. °· Eat and drink lightly if you think you are going into labor. °· If Braxton Hicks contractions are making you uncomfortable: °? Change your position from lying down or resting to walking, or change from walking to resting. °? Sit and rest in a tub of warm water. °? Drink enough fluid to keep your urine clear or pale yellow. Dehydration may cause these contractions. °? Do slow and deep breathing several times an hour. °· Keep all follow-up prenatal visits as told by your health care provider. This is important. °Contact a health care provider if: °· You have a  fever. °· You have continuous pain in your abdomen. °Get help right away if: °· Your contractions become stronger, more regular, and closer together. °· You have fluid leaking or gushing from your vagina. °· You pass blood-tinged mucus (bloody show). °· You have bleeding from your vagina. °· You have low back pain that you never had before. °· You feel your baby’s head pushing down and causing pelvic pressure. °· Your baby is not moving inside you as much as it used to. °Summary °· Contractions that occur before labor are called Braxton Hicks contractions, false labor, or practice contractions. °· Braxton Hicks contractions are usually shorter, weaker, farther apart, and less regular than true labor contractions. True labor contractions usually become progressively stronger and regular and they become more frequent. °· Manage discomfort from Braxton Hicks contractions by changing position, resting in a warm bath, drinking plenty of water, or practicing deep breathing. °This information is not intended to replace advice given to you by your health care provider. Make sure you discuss any questions you have with your health care provider. °Document Released: 07/23/2005 Document Revised: 06/11/2016 Document Reviewed: 06/11/2016 °Elsevier Interactive Patient Education © 2017 [employer] ° °

## 2017-02-05 NOTE — Progress Notes (Signed)
   PRENATAL VISIT NOTE  Subjective:  Jahnia Hewes is a 26 y.o. G2P0010 at [redacted]w[redacted]d being seen today for ongoing prenatal care.  She is currently monitored for the following issues for this low-risk pregnancy and has Supervision of normal pregnancy in first trimester and Marijuana use on her problem list.  Patient reports no complaints.  Contractions: Irregular. Vag. Bleeding: None.  Movement: Present. Denies leaking of fluid.   The following portions of the patient's history were reviewed and updated as appropriate: allergies, current medications, past family history, past medical history, past social history, past surgical history and problem list. Problem list updated.  Objective:   Vitals:   02/05/17 0950  BP: 115/63  Pulse: 87  Weight: 140 lb 14.4 oz (63.9 kg)    Fetal Status: Fetal Heart Rate (bpm): 140 Fundal Height: 35 cm Movement: Present  Presentation: Vertex  General:  Alert, oriented and cooperative. Patient is in no acute distress.  Skin: Skin is warm and dry. No rash noted.   Cardiovascular: Normal heart rate noted  Respiratory: Normal respiratory effort, no problems with respiration noted  Abdomen: Soft, gravid, appropriate for gestational age. Pain/Pressure: Present     Pelvic:  Cervical exam performed Dilation: Fingertip Effacement (%): 50 Station: -2  Extremities: Normal range of motion.  Edema: None  Mental Status: Normal mood and affect. Normal behavior. Normal judgment and thought content.   Assessment and Plan:  Pregnancy: G2P0010 at [redacted]w[redacted]d  1. Encounter for supervision of other normal pregnancy in first trimester - Culture, beta strep (group b only) - GC/CT negative last week - did not repeat today   Preterm labor symptoms and general obstetric precautions including but not limited to vaginal bleeding, contractions, leaking of fluid and fetal movement were reviewed in detail with the patient. Please refer to After Visit Summary for other counseling  recommendations.  Return in about 1 week (around 02/12/2017) for LOB.   Kerry Hough, PA-C

## 2017-02-09 LAB — CULTURE, BETA STREP (GROUP B ONLY): Strep Gp B Culture: NEGATIVE

## 2017-02-15 ENCOUNTER — Ambulatory Visit (INDEPENDENT_AMBULATORY_CARE_PROVIDER_SITE_OTHER): Payer: Medicaid Other | Admitting: Obstetrics & Gynecology

## 2017-02-15 VITALS — BP 118/73 | HR 93 | Wt 139.9 lb

## 2017-02-15 DIAGNOSIS — Z3403 Encounter for supervision of normal first pregnancy, third trimester: Secondary | ICD-10-CM

## 2017-02-15 NOTE — Patient Instructions (Signed)
Return to clinic for any scheduled appointments or obstetric concerns, or go to MAU for evaluation  

## 2017-02-15 NOTE — Progress Notes (Signed)
   PRENATAL VISIT NOTE  Subjective:  Tricia Campbell is a 26 y.o. G2P0010 at [redacted]w[redacted]d being seen today for ongoing prenatal care.  She is currently monitored for the following issues for this low-risk pregnancy and has Encounter for supervision of normal first pregnancy in third trimester and Marijuana use on her problem list.  Patient reports occasional contractions.  Contractions: Irregular. Vag. Bleeding: None.  Movement: Present. Denies leaking of fluid.   The following portions of the patient's history were reviewed and updated as appropriate: allergies, current medications, past family history, past medical history, past social history, past surgical history and problem list. Problem list updated.  Objective:   Vitals:   02/15/17 1025  BP: 118/73  Pulse: 93  Weight: 139 lb 14.4 oz (63.5 kg)    Fetal Status: Fetal Heart Rate (bpm): 160 Fundal Height: 37 cm Movement: Present  Presentation: Vertex  General:  Alert, oriented and cooperative. Patient is in no acute distress.  Skin: Skin is warm and dry. No rash noted.   Cardiovascular: Normal heart rate noted  Respiratory: Normal respiratory effort, no problems with respiration noted  Abdomen: Soft, gravid, appropriate for gestational age. Pain/Pressure: Present     Pelvic:  Cervical exam performed Dilation: 1.5 Effacement (%): 70 Station: -2  Extremities: Normal range of motion.  Edema: None  Mental Status: Normal mood and affect. Normal behavior. Normal judgment and thought content.   Assessment and Plan:  Pregnancy: G2P0010 at [redacted]w[redacted]d  1. Encounter for supervision of normal first pregnancy in third trimester Term labor symptoms and general obstetric precautions including but not limited to vaginal bleeding, contractions, leaking of fluid and fetal movement were reviewed in detail with the patient. Please refer to After Visit Summary for other counseling recommendations.  Return in about 1 week (around 02/22/2017) for OB  Visit (LOB).   Verita Schneiders, MD

## 2017-02-18 ENCOUNTER — Ambulatory Visit (INDEPENDENT_AMBULATORY_CARE_PROVIDER_SITE_OTHER): Payer: Medicaid Other | Admitting: Medical

## 2017-02-18 VITALS — BP 111/62 | HR 89 | Wt 141.0 lb

## 2017-02-18 DIAGNOSIS — Z3403 Encounter for supervision of normal first pregnancy, third trimester: Secondary | ICD-10-CM

## 2017-02-18 NOTE — Patient Instructions (Signed)
Fetal Movement Counts °Patient Name: ________________________________________________ Patient Due Date: ____________________ °What is a fetal movement count? °A fetal movement count is the number of times that you feel your baby move during a certain amount of time. This may also be called a fetal kick count. A fetal movement count is recommended for every pregnant woman. You may be asked to start counting fetal movements as early as week 28 of your pregnancy. °Pay attention to when your baby is most active. You may notice your baby's sleep and wake cycles. You may also notice things that make your baby move more. You should do a fetal movement count: °· When your baby is normally most active. °· At the same time each day. ° °A good time to count movements is while you are resting, after having something to eat and drink. °How do I count fetal movements? °1. Find a quiet, comfortable area. Sit, or lie down on your side. °2. Write down the date, the start time and stop time, and the number of movements that you felt between those two times. Take this information with you to your health care visits. °3. For 2 hours, count kicks, flutters, swishes, rolls, and jabs. You should feel at least 10 movements during 2 hours. °4. You may stop counting after you have felt 10 movements. °5. If you do not feel 10 movements in 2 hours, have something to eat and drink. Then, keep resting and counting for 1 hour. If you feel at least 4 movements during that hour, you may stop counting. °Contact a health care provider if: °· You feel fewer than 4 movements in 2 hours. °· Your baby is not moving like he or she usually does. °Date: ____________ Start time: ____________ Stop time: ____________ Movements: ____________ °Date: ____________ Start time: ____________ Stop time: ____________ Movements: ____________ °Date: ____________ Start time: ____________ Stop time: ____________ Movements: ____________ °Date: ____________ Start time:  ____________ Stop time: ____________ Movements: ____________ °Date: ____________ Start time: ____________ Stop time: ____________ Movements: ____________ °Date: ____________ Start time: ____________ Stop time: ____________ Movements: ____________ °Date: ____________ Start time: ____________ Stop time: ____________ Movements: ____________ °Date: ____________ Start time: ____________ Stop time: ____________ Movements: ____________ °Date: ____________ Start time: ____________ Stop time: ____________ Movements: ____________ °This information is not intended to replace advice given to you by your health care provider. Make sure you discuss any questions you have with your health care provider. °Document Released: 08/22/2006 Document Revised: 03/21/2016 Document Reviewed: 09/01/2015 °Elsevier Interactive Patient Education © 2018 [employer] °Braxton Hicks Contractions °Contractions of the uterus can occur throughout pregnancy, but they are not always a sign that you are in labor. You may have practice contractions called Braxton Hicks contractions. These false labor contractions are sometimes confused with true labor. °What are Braxton Hicks contractions? °Braxton Hicks contractions are tightening movements that occur in the muscles of the uterus before labor. Unlike true labor contractions, these contractions do not result in opening (dilation) and thinning of the cervix. Toward the end of pregnancy (32-34 weeks), Braxton Hicks contractions can happen more often and may become stronger. These contractions are sometimes difficult to tell apart from true labor because they can be very uncomfortable. You should not feel embarrassed if you go to the hospital with false labor. °Sometimes, the only way to tell if you are in true labor is for your health care provider to look for changes in the cervix. The health care provider will do a physical exam and may monitor your contractions. If   you are not in true labor, the exam  should show that your cervix is not dilating and your water has not broken. °If there are no prenatal problems or other health problems associated with your pregnancy, it is completely safe for you to be sent home with false labor. You may continue to have Braxton Hicks contractions until you go into true labor. °How can I tell the difference between true labor and false labor? °· Differences °? False labor °? Contractions last 30-70 seconds.: Contractions are usually shorter and not as strong as true labor contractions. °? Contractions become very regular.: Contractions are usually irregular. °? Discomfort is usually felt in the top of the uterus, and it spreads to the lower abdomen and low back.: Contractions are often felt in the front of the lower abdomen and in the groin. °? Contractions do not go away with walking.: Contractions may go away when you walk around or change positions while lying down. °? Contractions usually become more intense and increase in frequency.: Contractions get weaker and are shorter-lasting as time goes on. °? The cervix dilates and gets thinner.: The cervix usually does not dilate or become thin. °Follow these instructions at home: °· Take over-the-counter and prescription medicines only as told by your health care provider. °· Keep up with your usual exercises and follow other instructions from your health care provider. °· Eat and drink lightly if you think you are going into labor. °· If Braxton Hicks contractions are making you uncomfortable: °? Change your position from lying down or resting to walking, or change from walking to resting. °? Sit and rest in a tub of warm water. °? Drink enough fluid to keep your urine clear or pale yellow. Dehydration may cause these contractions. °? Do slow and deep breathing several times an hour. °· Keep all follow-up prenatal visits as told by your health care provider. This is important. °Contact a health care provider if: °· You have a  fever. °· You have continuous pain in your abdomen. °Get help right away if: °· Your contractions become stronger, more regular, and closer together. °· You have fluid leaking or gushing from your vagina. °· You pass blood-tinged mucus (bloody show). °· You have bleeding from your vagina. °· You have low back pain that you never had before. °· You feel your baby’s head pushing down and causing pelvic pressure. °· Your baby is not moving inside you as much as it used to. °Summary °· Contractions that occur before labor are called Braxton Hicks contractions, false labor, or practice contractions. °· Braxton Hicks contractions are usually shorter, weaker, farther apart, and less regular than true labor contractions. True labor contractions usually become progressively stronger and regular and they become more frequent. °· Manage discomfort from Braxton Hicks contractions by changing position, resting in a warm bath, drinking plenty of water, or practicing deep breathing. °This information is not intended to replace advice given to you by your health care provider. Make sure you discuss any questions you have with your health care provider. °Document Released: 07/23/2005 Document Revised: 06/11/2016 Document Reviewed: 06/11/2016 °Elsevier Interactive Patient Education © 2017 [employer] ° °

## 2017-02-18 NOTE — Progress Notes (Signed)
   PRENATAL VISIT NOTE  Subjective:  Tricia Campbell is a 26 y.o. G2P0010 at [redacted]w[redacted]d being seen today for ongoing prenatal care.  She is currently monitored for the following issues for this low-risk pregnancy and has Encounter for supervision of normal first pregnancy in third trimester and Marijuana use on her problem list.  Patient reports occasional contractions and lower abdominal pressure with standing.  Contractions: Irregular. Vag. Bleeding: None.  Movement: Present. Denies leaking of fluid.   The following portions of the patient's history were reviewed and updated as appropriate: allergies, current medications, past family history, past medical history, past social history, past surgical history and problem list. Problem list updated.  Objective:   Vitals:   02/18/17 1417 02/18/17 1420  BP: 99/63 111/62  Pulse: 92 89  Weight: 141 lb (64 kg)     Fetal Status: Fetal Heart Rate (bpm): 143 Fundal Height: 37 cm Movement: Present     General:  Alert, oriented and cooperative. Patient is in no acute distress.  Skin: Skin is warm and dry. No rash noted.   Cardiovascular: Normal heart rate noted  Respiratory: Normal respiratory effort, no problems with respiration noted  Abdomen: Soft, gravid, appropriate for gestational age.  Pain/Pressure: Present     Pelvic: Cervical exam deferred        Extremities: Normal range of motion.  Edema: None  Mental Status:  Normal mood and affect. Normal behavior. Normal judgment and thought content.   Assessment and Plan:  Pregnancy: G2P0010 at [redacted]w[redacted]d  1. Encounter for supervision of normal first pregnancy in third trimester - Doing well, declines cervical exam today   Term labor symptoms and general obstetric precautions including but not limited to vaginal bleeding, contractions, leaking of fluid and fetal movement were reviewed in detail with the patient. Please refer to After Visit Summary for other counseling recommendations.  Return  in about 1 week (around 02/25/2017) for LOB.   Kerry Hough, PA-C

## 2017-02-20 ENCOUNTER — Encounter (HOSPITAL_COMMUNITY): Payer: Self-pay

## 2017-02-20 ENCOUNTER — Inpatient Hospital Stay (HOSPITAL_COMMUNITY)
Admission: AD | Admit: 2017-02-20 | Discharge: 2017-02-20 | Disposition: A | Discharge status: Home / Self Care | Payer: Medicaid Other | Source: Ambulatory Visit | Attending: Family Medicine | Admitting: Family Medicine

## 2017-02-20 DIAGNOSIS — Z87891 Personal history of nicotine dependence: Secondary | ICD-10-CM | POA: Diagnosis not present

## 2017-02-20 DIAGNOSIS — O471 False labor at or after 37 completed weeks of gestation: Secondary | ICD-10-CM | POA: Diagnosis not present

## 2017-02-20 DIAGNOSIS — Z3A38 38 weeks gestation of pregnancy: Secondary | ICD-10-CM | POA: Diagnosis not present

## 2017-02-20 DIAGNOSIS — R1030 Lower abdominal pain, unspecified: Secondary | ICD-10-CM | POA: Insufficient documentation

## 2017-02-20 NOTE — MAU Note (Addendum)
Pt C/O pelvic pain & pressure since last night, became unbearable this morning, also has back pain & pressure.  Pt states she fell two days, landed on back/buttocks. Pain has been worse ever since fall. Denies bleeding or LOF.

## 2017-02-20 NOTE — MAU Provider Note (Signed)
  History     CSN: [id number]  Arrival date and time: 02/20/17 1350   None     Chief Complaint  Patient presents with  . Labor Eval  . Fall   HPI 26 yo G2P0020 at [redacted]w[redacted]d presenting today for the evaluation of pelvic pressure, lower abdominal cramping, and intermittent lower back pain since yesterday evening which worsened while working today. Patient reports falling on her buttock 2 days ago without any abdominal trauma. She reports good fetal movement since the fall. She denies any leakage of fluid or vaginal bleeding. Patient has had uncomplicated prenatal care at [facility]  OB History    Gravida Para Term Preterm AB Living   2       1 0   SAB TAB Ectopic Multiple Live Births     1     0      History reviewed. No pertinent past medical history.  Past Surgical History:  Procedure Laterality Date  . WISDOM TOOTH EXTRACTION      Family History  Problem Relation Age of Onset  . Asthma Neg Hx   . Diabetes Neg Hx   . Heart disease Neg Hx   . Hypertension Neg Hx   . Kidney disease Neg Hx   . Stroke Neg Hx     Social History  Substance Use Topics  . Smoking status: Former Smoker    Packs/day: 0.20    Types: Cigars    Quit date: 12/05/2015  . Smokeless tobacco: Never Used  . Alcohol use No    Allergies: No Known Allergies  Prescriptions Prior to Admission  Medication Sig Dispense Refill Last Dose  . Prenatal MV-Min-FA-Omega-3 (PRENATAL GUMMIES/DHA & FA) 0.4-32.5 MG CHEW Chew 2 each by mouth daily.   Taking    Review of Systems  See pertinent in HPI Physical Exam   Blood pressure 103/63, pulse 80, temperature 97.7 F (36.5 C), temperature source Oral, resp. rate 16, last menstrual period 05/10/2016.  Physical Exam GENERAL: Well-developed, well-nourished female in no acute distress.  ABDOMEN: Soft, nontender, gravid CERVIX: 1.5/70/-2 EXTREMITIES: No cyanosis, clubbing, or edema, 2+ distal pulses.  MAU Course  Procedures  MDM FHT: baseline 140, mod  variability, +accels, no decels Toco: irregular contractions q7-10 minutes  Cervical exam unchanged an hour later Assessment and Plan  26 yo G2P0010 at [redacted]w[redacted]d with false labor - Labor precautions reviewed - Follow up as scheduled for routine prenatal care - RTC to MAU prn   Halynn Reitano 02/20/2017, 2:19 PM

## 2017-02-20 NOTE — MAU Note (Signed)
Urine in lab 

## 2017-02-20 NOTE — Discharge Instructions (Signed)

## 2017-02-21 ENCOUNTER — Encounter: Payer: Medicaid Other | Admitting: Advanced Practice Midwife

## 2017-02-25 ENCOUNTER — Encounter: Payer: Medicaid Other | Admitting: Certified Nurse Midwife

## 2017-02-25 ENCOUNTER — Inpatient Hospital Stay (HOSPITAL_COMMUNITY)
Admission: AD | Admit: 2017-02-25 | Discharge: 2017-02-25 | Disposition: A | Discharge status: Home / Self Care | Payer: Medicaid Other | Source: Ambulatory Visit | Attending: Family Medicine | Admitting: Family Medicine

## 2017-02-25 ENCOUNTER — Inpatient Hospital Stay (HOSPITAL_COMMUNITY)
Admission: AD | Admit: 2017-02-25 | Discharge: 2017-02-27 | DRG: 774 | Disposition: A | Discharge status: Home / Self Care | Payer: Medicaid Other | Source: Ambulatory Visit | Attending: Family Medicine | Admitting: Family Medicine

## 2017-02-25 ENCOUNTER — Encounter (HOSPITAL_COMMUNITY): Payer: Self-pay

## 2017-02-25 ENCOUNTER — Encounter (HOSPITAL_COMMUNITY): Payer: Self-pay | Admitting: *Deleted

## 2017-02-25 DIAGNOSIS — O99324 Drug use complicating childbirth: Secondary | ICD-10-CM | POA: Diagnosis present

## 2017-02-25 DIAGNOSIS — Z3A39 39 weeks gestation of pregnancy: Secondary | ICD-10-CM

## 2017-02-25 DIAGNOSIS — Z87891 Personal history of nicotine dependence: Secondary | ICD-10-CM

## 2017-02-25 DIAGNOSIS — F1290 Cannabis use, unspecified, uncomplicated: Secondary | ICD-10-CM | POA: Diagnosis present

## 2017-02-25 DIAGNOSIS — O471 False labor at or after 37 completed weeks of gestation: Secondary | ICD-10-CM

## 2017-02-25 DIAGNOSIS — Z3493 Encounter for supervision of normal pregnancy, unspecified, third trimester: Secondary | ICD-10-CM | POA: Diagnosis present

## 2017-02-25 MED ORDER — LIDOCAINE HCL (PF) 1 % IJ SOLN
INTRAMUSCULAR | Status: AC
  Administered 2017-02-25: 30 mL
  Filled 2017-02-25: qty 30

## 2017-02-25 MED ORDER — ZOLPIDEM TARTRATE 5 MG PO TABS: 5.0000 mg | ORAL_TABLET | Freq: Every evening | ORAL | Status: DC | PRN

## 2017-02-25 MED ORDER — SOD CITRATE-CITRIC ACID 500-334 MG/5ML PO SOLN
30.0000 mL | ORAL | Status: DC | PRN
Start: 2017-02-25 — End: 2017-02-25

## 2017-02-25 MED ORDER — OXYCODONE-ACETAMINOPHEN 5-325 MG PO TABS
1.0000 | ORAL_TABLET | ORAL | Status: DC | PRN
  Administered 2017-02-25 – 2017-02-27 (×5): 1 via ORAL
  Filled 2017-02-25 (×4): qty 1

## 2017-02-25 MED ORDER — OXYTOCIN 10 UNIT/ML IJ SOLN
INTRAMUSCULAR | Status: AC
  Administered 2017-02-25: 10 [IU]
  Filled 2017-02-25: qty 1

## 2017-02-25 MED ORDER — ACETAMINOPHEN 325 MG PO TABS
650.0000 mg | ORAL_TABLET | ORAL | Status: DC | PRN
  Filled 2017-02-25 (×2): qty 2

## 2017-02-25 MED ORDER — NALBUPHINE SYRINGE 5 MG/0.5 ML
10.0000 mg | INJECTION | Freq: Once | INTRAMUSCULAR | Status: AC
  Administered 2017-02-25: 10 mg via INTRAMUSCULAR
  Filled 2017-02-25: qty 1

## 2017-02-25 MED ORDER — LIDOCAINE HCL (PF) 1 % IJ SOLN
30.0000 mL | INTRAMUSCULAR | Status: DC | PRN
  Filled 2017-02-25: qty 30

## 2017-02-25 MED ORDER — ACETAMINOPHEN 325 MG PO TABS: 650.0000 mg | ORAL_TABLET | ORAL | Status: DC | PRN

## 2017-02-25 MED ORDER — IBUPROFEN 600 MG PO TABS
600.0000 mg | ORAL_TABLET | Freq: Four times a day (QID) | ORAL | Status: DC
  Administered 2017-02-25 – 2017-02-27 (×8): 600 mg via ORAL
  Filled 2017-02-25 (×8): qty 1

## 2017-02-25 MED ORDER — SENNOSIDES-DOCUSATE SODIUM 8.6-50 MG PO TABS
2.0000 | ORAL_TABLET | ORAL | Status: DC
  Administered 2017-02-26 (×2): 2 via ORAL
  Filled 2017-02-25 (×2): qty 2

## 2017-02-25 MED ORDER — LACTATED RINGERS IV SOLN: 500.0000 mL | INTRAVENOUS | Status: DC | PRN

## 2017-02-25 MED ORDER — PROMETHAZINE HCL 25 MG/ML IJ SOLN
25.0000 mg | Freq: Once | INTRAMUSCULAR | Status: AC
  Administered 2017-02-25: 25 mg via INTRAMUSCULAR
  Filled 2017-02-25: qty 1

## 2017-02-25 MED ORDER — WITCH HAZEL-GLYCERIN EX PADS: 1.0000 "application " | MEDICATED_PAD | CUTANEOUS | Status: DC | PRN

## 2017-02-25 MED ORDER — LACTATED RINGERS IV SOLN: INTRAVENOUS | Status: DC

## 2017-02-25 MED ORDER — SIMETHICONE 80 MG PO CHEW: 80.0000 mg | CHEWABLE_TABLET | ORAL | Status: DC | PRN

## 2017-02-25 MED ORDER — MISOPROSTOL 200 MCG PO TABS
ORAL_TABLET | ORAL | Status: AC
  Administered 2017-02-25: 1000 ug
  Filled 2017-02-25: qty 5

## 2017-02-25 MED ORDER — OXYCODONE-ACETAMINOPHEN 5-325 MG PO TABS: 2.0000 | ORAL_TABLET | ORAL | Status: DC | PRN

## 2017-02-25 MED ORDER — FENTANYL CITRATE (PF) 100 MCG/2ML IJ SOLN: 100.0000 ug | INTRAMUSCULAR | Status: DC | PRN

## 2017-02-25 MED ORDER — FLEET ENEMA 7-19 GM/118ML RE ENEM: 1.0000 | ENEMA | RECTAL | Status: DC | PRN

## 2017-02-25 MED ORDER — BENZOCAINE-MENTHOL 20-0.5 % EX AERO
1.0000 "application " | INHALATION_SPRAY | CUTANEOUS | Status: DC | PRN
  Administered 2017-02-25 – 2017-02-27 (×2): 1 via TOPICAL
  Filled 2017-02-25 (×2): qty 56

## 2017-02-25 MED ORDER — OXYTOCIN BOLUS FROM INFUSION
500.0000 mL | Freq: Once | INTRAVENOUS | Status: AC
  Administered 2017-02-25: 500 m[IU]/min via INTRAVENOUS

## 2017-02-25 MED ORDER — PRENATAL MULTIVITAMIN CH
1.0000 | ORAL_TABLET | Freq: Every day | ORAL | Status: DC
  Administered 2017-02-26 – 2017-02-27 (×2): 1 via ORAL
  Filled 2017-02-25 (×2): qty 1

## 2017-02-25 MED ORDER — OXYCODONE-ACETAMINOPHEN 5-325 MG PO TABS
1.0000 | ORAL_TABLET | ORAL | Status: DC | PRN
  Filled 2017-02-25: qty 1

## 2017-02-25 MED ORDER — ONDANSETRON HCL 4 MG PO TABS: 4.0000 mg | ORAL_TABLET | ORAL | Status: DC | PRN

## 2017-02-25 MED ORDER — ONDANSETRON HCL 4 MG/2ML IJ SOLN: 4.0000 mg | INTRAMUSCULAR | Status: DC | PRN

## 2017-02-25 MED ORDER — COCONUT OIL OIL: 1.0000 "application " | TOPICAL_OIL | Status: DC | PRN

## 2017-02-25 MED ORDER — DIBUCAINE 1 % RE OINT: 1.0000 "application " | TOPICAL_OINTMENT | RECTAL | Status: DC | PRN

## 2017-02-25 MED ORDER — OXYTOCIN 40 UNITS IN LACTATED RINGERS INFUSION - SIMPLE MED
INTRAVENOUS | Status: AC
  Administered 2017-02-25: 500 m[IU]/min via INTRAVENOUS
  Filled 2017-02-25: qty 1000

## 2017-02-25 MED ORDER — OXYTOCIN 40 UNITS IN LACTATED RINGERS INFUSION - SIMPLE MED
2.5000 [IU]/h | INTRAVENOUS | Status: DC
  Administered 2017-02-25: 2.5 [IU]/h via INTRAVENOUS

## 2017-02-25 MED ORDER — METHYLERGONOVINE MALEATE 0.2 MG/ML IJ SOLN
INTRAMUSCULAR | Status: AC
  Administered 2017-02-25: 0.2 mg via INTRAMUSCULAR
  Filled 2017-02-25: qty 1

## 2017-02-25 MED ORDER — ONDANSETRON HCL 4 MG/2ML IJ SOLN: 4.0000 mg | Freq: Four times a day (QID) | INTRAMUSCULAR | Status: DC | PRN

## 2017-02-25 MED ORDER — DIPHENHYDRAMINE HCL 25 MG PO CAPS: 25.0000 mg | ORAL_CAPSULE | Freq: Four times a day (QID) | ORAL | Status: DC | PRN

## 2017-02-25 NOTE — H&P (Signed)
LABOR AND DELIVERY ADMISSION HISTORY AND PHYSICAL NOTE  Tricia Campbell is a 26 y.o. female G2P0010 with IUP at [redacted]w[redacted]d by 7wk ultrasound presenting for SOL.   She reports positive fetal movement. She denies leakage of fluid or vaginal bleeding. Patient denies headache, vision changes, chest pain, shortness of breath, or RUQ pain.  Prenatal History/Complications: [place] - only 2 visits  Past Medical History: No past medical history on file.  Past Surgical History: Past Surgical History:  Procedure Laterality Date  . WISDOM TOOTH EXTRACTION      Obstetrical History: OB History    Gravida Para Term Preterm AB Living   2       1 0   SAB TAB Ectopic Multiple Live Births     1     0      Social History: Social History   Social History  . Marital status: Single    Spouse name: N/A  . Number of children: N/A  . Years of education: N/A   Social History Main Topics  . Smoking status: Former Smoker    Packs/day: 0.20    Types: Cigars    Quit date: 12/05/2015  . Smokeless tobacco: Never Used  . Alcohol use No  . Drug use: No  . Sexual activity: Yes   Other Topics Concern  . Not on file   Social History Narrative  . No narrative on file    Family History: Family History  Problem Relation Age of Onset  . Asthma Neg Hx   . Diabetes Neg Hx   . Heart disease Neg Hx   . Hypertension Neg Hx   . Kidney disease Neg Hx   . Stroke Neg Hx     Allergies: No Known Allergies  Prescriptions Prior to Admission  Medication Sig Dispense Refill Last Dose  . Prenatal MV-Min-FA-Omega-3 (PRENATAL GUMMIES/DHA & FA) 0.4-32.5 MG CHEW Chew 2 each by mouth daily.   Past Week at Unknown time     Review of Systems   All systems reviewed and negative except as stated in HPI  Blood pressure 126/75, pulse 75, temperature 98 F (36.7 C), temperature source Axillary, resp. rate 18, height 5\' 6"  (1.676 m), weight 64 kg (141 lb), last menstrual period 05/10/2016. General appearance:  alert, cooperative, appears stated age and moderate distress Lungs: clear to auscultation bilaterally Heart: regular rate and rhythm Abdomen: soft, non-tender; bowel sounds normal Extremities: No calf swelling or tenderness Presentation: cephalic Fetal monitoring: 120bpm/mod/-ac/+dc, early Uterine activity: q1-2 minutes Dilation: 10 (upon assumption of care)   Prenatal labs: ABO, Rh: B/POS/-- (01/09 0953) Antibody: NEG (01/09 0953) Rubella: Immune RPR: Non Reactive (05/02 0755)  HBsAg: NEGATIVE (01/09 0953)  HIV: NONREACTIVE (01/09 0953)  GBS: Negative (07/03 0000)  1 hr Glucola: Normal Genetic screening: Quad normal Anatomy US: Normal  Prenatal Transfer Tool  Maternal Diabetes: No Genetic Screening: Normal Maternal Ultrasounds/Referrals: Normal Fetal Ultrasounds or other Referrals:  None Maternal Substance Abuse:  No Significant Maternal Medications:  None Significant Maternal Lab Results: None  No results found for this or any previous visit (from the past 24 hour(s)).  Patient Active Problem List   Diagnosis Date Noted  . Term pregnancy 02/25/2017  . 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 Encounter for supervision of normal first pregnancy in third trimester 08/14/2016    Assessment: Tricia Campbell is a 26 y.o. G2P0010 at [redacted]w[redacted]d here for SOL  #Labor: Expectant management #Pain: Labor support #FWB: Cat 1 #GBS: Negative (07/03 0000) #MOF: Breast #MOC: Undecided (  nexplanon vs IUD) #Circ: Yes, will discuss in vs outpatient  Aneta Mins 02/25/2017, 4:03 PM  OB FELLOW HISTORY AND PHYSICAL ATTESTATION  I have seen and examined this patient; I agree with above documentation in the resident's note.    Gailen Shelter, MD OB Fellow 02/25/2017, 5:45 PM

## 2017-02-25 NOTE — MAU Note (Signed)
I have communicated with Dr Lambert Keto and reviewed vital signs:  Vitals:   02/25/17 0733 02/25/17 1047  BP: 120/70 112/68  Pulse: 88 74  Resp:  18  Temp:      Vaginal exam:  Dilation: 3.5 Effacement (%): 80 Cervical Position: Middle Station: -2 Presentation: Vertex Exam by:: Velna Ochs RN,   Also reviewed contraction pattern and that non-stress test is reactive.  It has been documented that patient is contracting every irregularly with minimal cervical change over 3-4 hours not indicating active labor.  Patient denies any other complaints.  Based on this report provider has given order for discharge.  A discharge order and diagnosis entered by a provider.   Labor discharge instructions reviewed with patient.

## 2017-02-25 NOTE — MAU Note (Signed)
Dr. Lambert Keto in to talk to pt.

## 2017-02-25 NOTE — Discharge Instructions (Signed)
Third Trimester of Pregnancy The third trimester is from week 28 through week 40 (months 7 through 9). The third trimester is a time when the unborn baby (fetus) is growing rapidly. At the end of the ninth month, the fetus is about 20 inches in length and weighs 6-10 pounds. Body changes during your third trimester Your body will continue to go through many changes during pregnancy. The changes vary from woman to woman. During the third trimester:  Your weight will continue to increase. You can expect to gain 25-35 pounds (11-16 kg) by the end of the pregnancy.  You may begin to get stretch marks on your hips, abdomen, and breasts.  You may urinate more often because the fetus is moving lower into your pelvis and pressing on your bladder.  You may develop or continue to have heartburn. This is caused by increased hormones that slow down muscles in the digestive tract.  You may develop or continue to have constipation because increased hormones slow digestion and cause the muscles that push waste through your intestines to relax.  You may develop hemorrhoids. These are swollen veins (varicose veins) in the rectum that can itch or be painful.  You may develop swollen, bulging veins (varicose veins) in your legs.  You may have increased body aches in the pelvis, back, or thighs. This is due to weight gain and increased hormones that are relaxing your joints.  You may have changes in your hair. These can include thickening of your hair, rapid growth, and changes in texture. Some women also have hair loss during or after pregnancy, or hair that feels dry or thin. Your hair will most likely return to normal after your baby is born.  Your breasts will continue to grow and they will continue to become tender. A yellow fluid (colostrum) may leak from your breasts. This is the first milk you are producing for your baby.  Your belly button may stick out.  You may notice more swelling in your hands,  face, or ankles.  You may have increased tingling or numbness in your hands, arms, and legs. The skin on your belly may also feel numb.  You may feel short of breath because of your expanding uterus.  You may have more problems sleeping. This can be caused by the size of your belly, increased need to urinate, and an increase in your body's metabolism.  You may notice the fetus "dropping," or moving lower in your abdomen (lightening).  You may have increased vaginal discharge.  You may notice your joints feel loose and you may have pain around your pelvic bone.  What to expect at prenatal visits You will have prenatal exams every 2 weeks until week 36. Then you will have weekly prenatal exams. During a routine prenatal visit:  You will be weighed to make sure you and the baby are growing normally.  Your blood pressure will be taken.  Your abdomen will be measured to track your baby's growth.  The fetal heartbeat will be listened to.  Any test results from the previous visit will be discussed.  You may have a cervical check near your due date to see if your cervix has softened or thinned (effaced).  You will be tested for Group B streptococcus. This happens between 35 and 37 weeks.  Your health care provider may ask you:  What your birth plan is.  How you are feeling.  If you are feeling the baby move.  If you have had   any abnormal symptoms, such as leaking fluid, bleeding, severe headaches, or abdominal cramping.  If you are using any tobacco products, including cigarettes, chewing tobacco, and electronic cigarettes.  If you have any questions.  Other tests or screenings that may be performed during your third trimester include:  Blood tests that check for low iron levels (anemia).  Fetal testing to check the health, activity level, and growth of the fetus. Testing is done if you have certain medical conditions or if there are problems during the  pregnancy.  Nonstress test (NST). This test checks the health of your baby to make sure there are no signs of problems, such as the baby not getting enough oxygen. During this test, a belt is placed around your belly. The baby is made to move, and its heart rate is monitored during movement.  What is false labor? False labor is a condition in which you feel small, irregular tightenings of the muscles in the womb (contractions) that usually go away with rest, changing position, or drinking water. These are called Braxton Hicks contractions. Contractions may last for hours, days, or even weeks before true labor sets in. If contractions come at regular intervals, become more frequent, increase in intensity, or become painful, you should see your health care provider. What are the signs of labor?  Abdominal cramps.  Regular contractions that start at 10 minutes apart and become stronger and more frequent with time.  Contractions that start on the top of the uterus and spread down to the lower abdomen and back.  Increased pelvic pressure and dull back pain.  A watery or bloody mucus discharge that comes from the vagina.  Leaking of amniotic fluid. This is also known as your "water breaking." It could be a slow trickle or a gush. Let your health care provider know if it has a color or strange odor. If you have any of these signs, call your health care provider right away, even if it is before your due date. Follow these instructions at home: Medicines  Follow your health care provider's instructions regarding medicine use. Specific medicines may be either safe or unsafe to take during pregnancy.  Take a prenatal vitamin that contains at least 600 micrograms (mcg) of folic acid.  If you develop constipation, try taking a stool softener if your health care provider approves. Eating and drinking  Eat a balanced diet that includes fresh fruits and vegetables, whole grains, good sources of protein  such as meat, eggs, or tofu, and low-fat dairy. Your health care provider will help you determine the amount of weight gain that is right for you.  Avoid raw meat and uncooked cheese. These carry germs that can cause birth defects in the baby.  If you have low calcium intake from food, talk to your health care provider about whether you should take a daily calcium supplement.  Eat four or five small meals rather than three large meals a day.  Limit foods that are high in fat and processed sugars, such as fried and sweet foods.  To prevent constipation: ? Drink enough fluid to keep your urine clear or pale yellow. ? Eat foods that are high in fiber, such as fresh fruits and vegetables, whole grains, and beans. Activity  Exercise only as directed by your health care provider. Most women can continue their usual exercise routine during pregnancy. Try to exercise for 30 minutes at least 5 days a week. Stop exercising if you experience uterine contractions.  Avoid heavy   lifting.  Do not exercise in extreme heat or humidity, or at high altitudes.  Wear low-heel, comfortable shoes.  Practice good posture.  You may continue to have sex unless your health care provider tells you otherwise. Relieving pain and discomfort  Take frequent breaks and rest with your legs elevated if you have leg cramps or low back pain.  Take warm sitz baths to soothe any pain or discomfort caused by hemorrhoids. Use hemorrhoid cream if your health care provider approves.  Wear a good support bra to prevent discomfort from breast tenderness.  If you develop varicose veins: ? Wear support pantyhose or compression stockings as told by your healthcare provider. ? Elevate your feet for 15 minutes, 3-4 times a day. Prenatal care  Write down your questions. Take them to your prenatal visits.  Keep all your prenatal visits as told by your health care provider. This is important. Safety  Wear your seat belt at  all times when driving.  Make a list of emergency phone numbers, including numbers for family, friends, the hospital, and police and fire departments. General instructions  Avoid cat litter boxes and soil used by cats. These carry germs that can cause birth defects in the baby. If you have a cat, ask someone to clean the litter box for you.  Do not travel far distances unless it is absolutely necessary and only with the approval of your health care provider.  Do not use hot tubs, steam rooms, or saunas.  Do not drink alcohol.  Do not use any products that contain nicotine or tobacco, such as cigarettes and e-cigarettes. If you need help quitting, ask your health care provider.  Do not use any medicinal herbs or unprescribed drugs. These chemicals affect the formation and growth of the baby.  Do not douche or use tampons or scented sanitary pads.  Do not cross your legs for long periods of time.  To prepare for the arrival of your baby: ? Take prenatal classes to understand, practice, and ask questions about labor and delivery. ? Make a trial run to the hospital. ? Visit the hospital and tour the maternity area. ? Arrange for maternity or paternity leave through employers. ? Arrange for family and friends to take care of pets while you are in the hospital. ? Purchase a rear-facing car seat and make sure you know how to install it in your car. ? Pack your hospital bag. ? Prepare the baby's nursery. Make sure to remove all pillows and stuffed animals from the baby's crib to prevent suffocation.  Visit your dentist if you have not gone during your pregnancy. Use a soft toothbrush to brush your teeth and be gentle when you floss. Contact a health care provider if:  You are unsure if you are in labor or if your water has broken.  You become dizzy.  You have mild pelvic cramps, pelvic pressure, or nagging pain in your abdominal area.  You have lower back pain.  You have persistent  nausea, vomiting, or diarrhea.  You have an unusual or bad smelling vaginal discharge.  You have pain when you urinate. Get help right away if:  Your water breaks before 37 weeks.  You have regular contractions less than 5 minutes apart before 37 weeks.  You have a fever.  You are leaking fluid from your vagina.  You have spotting or bleeding from your vagina.  You have severe abdominal pain or cramping.  You have rapid weight loss or weight gain.    You have shortness of breath with chest pain.  You notice sudden or extreme swelling of your face, hands, ankles, feet, or legs.  Your baby makes fewer than 10 movements in 2 hours.  You have severe headaches that do not go away when you take medicine.  You have vision changes. Summary  The third trimester is from week 28 through week 40, months 7 through 9. The third trimester is a time when the unborn baby (fetus) is growing rapidly.  During the third trimester, your discomfort may increase as you and your baby continue to gain weight. You may have abdominal, leg, and back pain, sleeping problems, and an increased need to urinate.  During the third trimester your breasts will keep growing and they will continue to become tender. A yellow fluid (colostrum) may leak from your breasts. This is the first milk you are producing for your baby.  False labor is a condition in which you feel small, irregular tightenings of the muscles in the womb (contractions) that eventually go away. These are called Braxton Hicks contractions. Contractions may last for hours, days, or even weeks before true labor sets in.  Signs of labor can include: abdominal cramps; regular contractions that start at 10 minutes apart and become stronger and more frequent with time; watery or bloody mucus discharge that comes from the vagina; increased pelvic pressure and dull back pain; and leaking of amniotic fluid. This information is not intended to replace advice  given to you by your health care provider. Make sure you discuss any questions you have with your health care provider. Document Released: 07/17/2001 Document Revised: 12/29/2015 Document Reviewed: 09/23/2012 Elsevier Interactive Patient Education  2017 [employer]  

## 2017-02-25 NOTE — MAU Note (Signed)
Pt on arrival was feeling like she needed to push or use restroom.  "I feel like I am going to pass out'. Pt walked back to rm.  Assisted with changing into gown.  Sitting on side of bed,  Reddish mucous noted. Small gush of lt green fluid noted on floor, particles present.  Attempted to do SVE- pt would not let RN finish exam, straightened and clamped legs, pulled back hand.  Called out for assistance.  2nd nurse to notify MD.  L&D charge nurse notified.  rm assigned. CNM present for transfer via stretcher to L&D

## 2017-02-25 NOTE — MAU Note (Signed)
Patient brought directly from lobby to mau room. Patient complete/SROM with mec.  CNM bedside and request transport to L/D. L/D called and patient taken via stretcher with RN and CNM bedside to admit bed.

## 2017-02-25 NOTE — MAU Note (Signed)
Pt reports contractions that started around 5-530am. Pt has not timed them. Pt denies LOF or vaginal bleeding. Reports good fetal movement. States she was 1.5cm last week.

## 2017-02-26 LAB — CBC
HCT: 29.8 % — ABNORMAL LOW (ref 36.0–46.0)
HEMOGLOBIN: 9.8 g/dL — AB (ref 12.0–15.0)
MCH: 29.5 pg (ref 26.0–34.0)
MCHC: 32.9 g/dL (ref 30.0–36.0)
MCV: 89.8 fL (ref 78.0–100.0)
PLATELETS: 209 10*3/uL (ref 150–400)
RBC: 3.32 MIL/uL — AB (ref 3.87–5.11)
RDW: 15.5 % (ref 11.5–15.5)
WBC: 15 10*3/uL — ABNORMAL HIGH (ref 4.0–10.5)

## 2017-02-26 NOTE — Progress Notes (Signed)
Post Partum Day #1 Subjective: no complaints, up ad lib and tolerating PO; breast and bottlefeeding; may desire d/c later today; Depo for contraception; denies dizziness w/ ambulation  Objective: Blood pressure (!) 103/52, pulse 71, temperature 98.8 F (37.1 C), temperature source Oral, resp. rate 18, height 5\' 6"  (1.676 m), weight 64 kg (141 lb), last menstrual period 05/10/2016, unknown if currently breastfeeding.  Physical Exam:  General: alert, cooperative and no distress Lochia: appropriate Uterine Fundus: firm DVT Evaluation: No evidence of DVT seen on physical exam.   Recent Labs  02/26/17 0602  HGB 9.8*  HCT 29.8*   Hgb not collected upon admission, but was 11.5 in May 2018  Assessment/Plan: Plan for discharge tomorrow   LOS: 1 day   SHAW, [place] 02/26/2017, 8:20 AM

## 2017-02-26 NOTE — Progress Notes (Signed)
CSW received consult for hx of marijuana use.  Referral was screened out due to the following: °~MOB had no documented substance use after initial prenatal visit/+UPT. °~MOB had no positive drug screens after initial prenatal visit/+UPT. °~Baby's UDS is negative. ° °Please consult CSW if current concerns arise or by MOB's request. ° °CSW will monitor CDS results and make report to Child Protective Services if warranted. ° °Adah Stoneberg Boyd-Gilyard, MSW, LCSW °Clinical Social Work °[phone] ° °

## 2017-02-26 NOTE — Lactation Note (Signed)
This note was copied from a baby's chart. Lactation Consultation Note  Patient Name: Tricia Campbell [id number] Date: 02/26/2017 Reason for consult: Initial assessment Baby at 21 hr of life. Mom has bf once since birth. She stated she is too tired. Reviewed the risks of formula and artifical nipples. Discussed baby behavior, feeding frequency, baby belly size, voids, wt loss, breast changes, and nipple care. Explained mom should remove nipple piercing for every bf because they are a choking hazard. Demonstrated manual expression, colostrum noted bilaterally, spoon in room. Given lactation handouts. Aware of OP services and support group.     Maternal Data Has patient been taught Hand Expression?: Yes  Feeding Feeding Type: Bottle Fed - Formula Nipple Type: Slow - flow Length of feed: 10 min  LATCH Score/Interventions Latch: Grasps breast easily, tongue down, lips flanged, rhythmical sucking.  Audible Swallowing: A few with stimulation Intervention(s): Skin to skin;Hand expression (unable to hand express any EBM)  Type of Nipple: Everted at rest and after stimulation (short shafted nipples; nipple piercing removed from L nipple)  Comfort (Breast/Nipple): Soft / non-tender     Hold (Positioning): Full assist, staff holds infant at breast Intervention(s): Breastfeeding basics reviewed;Support Pillows;Position options  LATCH Score: 7  Lactation Tools Discussed/Used     Consult Status Consult Status: Follow-up Date: 02/27/17 Follow-up type: In-patient    Denzil Hughes 02/26/2017, 12:25 PM

## 2017-02-27 LAB — RPR: RPR: NONREACTIVE

## 2017-02-27 MED ORDER — IBUPROFEN 600 MG PO TABS: 600.0000 mg | ORAL_TABLET | Freq: Four times a day (QID) | ORAL | 0 refills | Status: DC

## 2017-02-27 NOTE — Plan of Care (Signed)
Problem: Education: Goal: Knowledge of condition will improve Provided sitz bath and explained use to patient. Patient verbalized understanding.   Problem: Nutritional: Goal: Mothers verbalization of comfort with breastfeeding process will improve Patient states she is not comfortable latching baby and would like lactation to see her prior to discharge. Baby not hungry while in room and had just eaten shortly prior. Discussed proper positioning during latch and signs of appropriate latch. Patient verbalized understanding. Notified lactation of patient's desire to be seen.

## 2017-02-27 NOTE — Discharge Instructions (Signed)

## 2017-02-27 NOTE — Lactation Note (Signed)
This note was copied from a baby's chart. Lactation Consultation Note  Patient Name: Tricia Campbell [id number] Date: 02/27/2017 Reason for consult: Follow-up assessment Baby at 43 hr of life and dyad set for d/c today. Upon entry baby was alert and sucking a pacifier in the basinet. Mom was agreeable to latching. Mom stated that baby will latch for 5-10 minutes to one breast then come off still hungry so she f/u with a bottle of formula. Suggested she offer both breast before offering formula. Showed mom how to guide baby to the breast in cross cradle. Mom has long finger nails and seems to have a hard time postioning baby at the breast. Mom stated, "baby does not like this", yet baby latches easily and eagerly. Suggested that mom put baby to breast at every feeding and stop the pacifier if her desire is to bf. Explained the risks of formula and artificial nipples again. Mom has a Medela DEBP at home. Suggested if she does not want to latch baby, she can pump to feed. Explained for a robust supply she will need to pump 8-12x/24hr. Mom has volume feeding guidelines and is aware that as baby grows the volume will increase. Mom is aware of lactation services and support group. She will call as needed.      Maternal Data    Feeding Feeding Type: Breast Fed Nipple Type: Slow - flow Length of feed: 20 min  LATCH Score/Interventions Latch: Grasps breast easily, tongue down, lips flanged, rhythmical sucking.  Audible Swallowing: A few with stimulation Intervention(s): Skin to skin;Hand expression  Type of Nipple: Everted at rest and after stimulation Intervention(s): No intervention needed  Comfort (Breast/Nipple): Soft / non-tender     Hold (Positioning): Full assist, staff holds infant at breast Intervention(s): Position options;Support Pillows  LATCH Score: 7  Lactation Tools Discussed/Used WIC Program: No (plans to f/u with them)   Consult Status Consult Status:  Complete    Denzil Hughes 02/27/2017, 10:59 AM

## 2017-02-27 NOTE — Plan of Care (Signed)
Problem: Education: Goal: Knowledge of condition will improve Discharge education, safety and follow up reviewed with patient. Patient verbalizes understanding of information.

## 2017-02-27 NOTE — Discharge Summary (Signed)
OB Discharge Summary     Patient Name: Tricia Campbell DOB: November 25, 1990 MRN: [id number]  Date of admission: 02/25/2017 Delivering MD: Gailen Shelter   Date of discharge: 02/27/2017  Admitting diagnosis: 39wks CTX 3-45mins Intrauterine pregnancy: [redacted]w[redacted]d     Secondary diagnosis:  Principal Problem:   SVD (spontaneous vaginal delivery) Active Problems:   Postpartum hemorrhage, delivered  Additional problems:  Patient Active Problem List   Diagnosis Date Noted  . SVD (spontaneous vaginal delivery) 02/25/2017  . Postpartum hemorrhage, delivered 02/25/2017  . 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 Encounter for supervision of normal first pregnancy in third trimester 08/14/2016        Discharge diagnosis: Term Pregnancy Delivered                                                                                                Post partum procedures:none  Augmentation: none  Complications: Hemorrhage of 700 mL  Hospital course:  Onset of Labor With Vaginal Delivery     26 y.o. yo G2P1011 at [redacted]w[redacted]d was admitted in Active Labor on 02/25/2017. Patient had an uncomplicated labor course as follows:  Membrane Rupture Time/Date: 2:50 PM ,02/25/2017   Intrapartum Procedures: Episiotomy: None [1]                                         Lacerations:  2nd degree [3]  Patient had a delivery of a Viable infant. See delivery note for specifics on immediate PP hemorrhage of 700cc requiring Pit, cytotec, and methergine.  02/25/2017  Information for the patient's newborn:  GERI, HEPLER [[id number]]  Delivery Method: Vag-Spont    Pateint had an uncomplicated postpartum course.  She is ambulating, tolerating a regular diet, passing flatus, and urinating well. Patient is discharged home in stable condition on 02/27/17.   Physical exam  Vitals:   02/25/17 2239 02/26/17 0605 02/26/17 1825 02/27/17 0539  BP: (!) 107/59 (!) 103/52 (!) 99/55 96/81  Pulse: 97 71 95 65  Resp: 20 18 20 16    Temp: 99.3 F (37.4 C) 98.8 F (37.1 C) 97.7 F (36.5 C) 97.6 F (36.4 C)  TempSrc: Oral Oral Oral Oral  Weight:      Height:       General: alert, cooperative and no distress Lochia: appropriate Uterine Fundus: firm Incision: N/A DVT Evaluation: No evidence of DVT seen on physical exam. Negative Homan's sign. No significant calf/ankle edema. Labs: Lab Results  Component Value Date   WBC 15.0 (H) 02/26/2017   HGB 9.8 (L) 02/26/2017   HCT 29.8 (L) 02/26/2017   MCV 89.8 02/26/2017   PLT 209 02/26/2017   CMP Latest Ref Rng & Units 07/17/2016  Glucose 65 - 99 mg/dL 92  BUN 6 - 20 mg/dL 7  Creatinine 0.44 - 1.00 mg/dL 0.68  Sodium 135 - 145 mmol/L 131(L)  Potassium 3.5 - 5.1 mmol/L 3.9  Chloride 101 - 111 mmol/L 101  CO2 22 - 32 mmol/L 24  Calcium 8.9 - 10.3 mg/dL 9.0  Total Protein 6.5 - 8.1 g/dL 7.3  Total Bilirubin 0.3 - 1.2 mg/dL 0.5  Alkaline Phos 38 - 126 U/L 46  AST 15 - 41 U/L 15  ALT 14 - 54 U/L 9(L)    Discharge instruction: per After Visit Summary and "Baby and Me Booklet".  After visit meds:  Allergies as of 02/27/2017   No Known Allergies     Medication List    TAKE these medications   ibuprofen 600 MG tablet Commonly known as:  ADVIL,MOTRIN Take 1 tablet (600 mg total) by mouth every 6 (six) hours.   PRENATAL GUMMIES/DHA & FA 0.4-32.5 MG Chew Chew 2 each by mouth daily.       Diet: routine diet  Activity: Advance as tolerated. Pelvic rest for 6 weeks.   Outpatient follow up:4-6 weeks Follow up Appt:No future appointments. Follow up Visit:No Follow-up on file.  Postpartum contraception: Depo Provera  Newborn Data: Live born female  Birth Weight: 7 lb 7.1 oz (3375 g) APGAR: 9, 9  Baby Feeding: Bottle and Breast Disposition:home with mother   02/27/2017 [place] Shirley, DO  CNM attestation I have seen and examined this patient and agree with above documentation in the resident's note.   Tricia Campbell is a 26 y.o.  G2P1011 s/p SVD with immediate PPH.   Pain is well controlled.  Plan for birth control is Depo-Provera.  Method of Feeding: both. Pt denies dizziness or concerns with ambulation.  PE:  BP 96/81 (BP Location: Left Arm)   Pulse 65   Temp 97.6 F (36.4 C) (Oral)   Resp 16   Ht 5\' 6"  (1.676 m)   Wt 64 kg (141 lb)   LMP 05/10/2016 (Approximate)   Breastfeeding? Unknown   BMI 22.76 kg/m  Fundus firm   Recent Labs  02/26/17 0602  HGB 9.8*  HCT 29.8*     Plan: discharge today - postpartum care discussed - f/u clinic in 4 weeks for postpartum visit   Serita Grammes, CNM 8:15 AM 02/27/2017

## 2017-02-28 ENCOUNTER — Ambulatory Visit (INDEPENDENT_AMBULATORY_CARE_PROVIDER_SITE_OTHER): Payer: Medicaid Other

## 2017-02-28 VITALS — BP 116/67 | HR 105

## 2017-02-28 DIAGNOSIS — Z30013 Encounter for initial prescription of injectable contraceptive: Secondary | ICD-10-CM

## 2017-02-28 DIAGNOSIS — Z3042 Encounter for surveillance of injectable contraceptive: Secondary | ICD-10-CM

## 2017-02-28 MED ORDER — MEDROXYPROGESTERONE ACETATE 150 MG/ML IM SUSP
150.0000 mg | Freq: Once | INTRAMUSCULAR | Status: AC
  Administered 2017-02-28: 150 mg via INTRAMUSCULAR

## 2017-02-28 NOTE — Progress Notes (Signed)
Patient presented to the office for Depo injection. Patient tolerated well.

## 2017-03-02 ENCOUNTER — Inpatient Hospital Stay (HOSPITAL_COMMUNITY)
Admission: AD | Admit: 2017-03-02 | Discharge: 2017-03-02 | Disposition: A | Discharge status: Home / Self Care | Payer: Medicaid Other | Source: Ambulatory Visit | Attending: Obstetrics and Gynecology | Admitting: Obstetrics and Gynecology

## 2017-03-02 ENCOUNTER — Encounter (HOSPITAL_COMMUNITY): Payer: Self-pay

## 2017-03-02 DIAGNOSIS — N939 Abnormal uterine and vaginal bleeding, unspecified: Secondary | ICD-10-CM | POA: Insufficient documentation

## 2017-03-02 DIAGNOSIS — Z87891 Personal history of nicotine dependence: Secondary | ICD-10-CM | POA: Insufficient documentation

## 2017-03-02 DIAGNOSIS — R109 Unspecified abdominal pain: Secondary | ICD-10-CM | POA: Diagnosis not present

## 2017-03-02 DIAGNOSIS — N898 Other specified noninflammatory disorders of vagina: Secondary | ICD-10-CM | POA: Diagnosis not present

## 2017-03-02 MED ORDER — CEPHALEXIN 500 MG PO CAPS: 500.0000 mg | ORAL_CAPSULE | Freq: Four times a day (QID) | ORAL | 0 refills | Status: DC

## 2017-03-02 NOTE — Discharge Instructions (Signed)
How to Take a Sitz Bath °A sitz bath is a warm water bath that is taken while you are sitting down. The water should only come up to your hips and should cover your buttocks. Your health care provider may recommend a sitz bath to help you: °· Clean the lower part of your body, including your genital area. °· With itching. °· With pain. °· With sore muscles or muscles that tighten or spasm. ° °How to take a sitz bath °Take 3-4 sitz baths per day or as told by your health care provider. °1. Partially fill a bathtub with warm water. You will only need the water to be deep enough to cover your hips and buttocks when you are sitting in it. °2. If your health care provider told you to put medicine in the water, follow the directions exactly. °3. Sit in the water and open the tub drain a little. °4. Turn on the warm water again to keep the tub at the correct level. Keep the water running constantly. °5. Soak in the water for 15-20 minutes or as told by your health care provider. °6. After the sitz bath, pat the affected area dry first. Do not rub it. °7. Be careful when you stand up after the sitz bath because you may feel dizzy. ° °Contact a health care provider if: °· Your symptoms get worse. Do not continue with sitz baths if your symptoms get worse. °· You have new symptoms. Do not continue with sitz baths until you talk with your health care provider. °This information is not intended to replace advice given to you by your health care provider. Make sure you discuss any questions you have with your health care provider. °Document Released: 04/14/2004 Document Revised: 12/21/2015 Document Reviewed: 07/21/2014 °Elsevier Interactive Patient Education © 2018 [employer] ° °

## 2017-03-02 NOTE — MAU Provider Note (Signed)
History   5 days status vag delivery in with c/o abd pain and something hanging out her vagina.  CSN: [id number]  Arrival date & time 03/02/17  1407   None     Chief Complaint  Patient presents with  . Vaginal Bleeding    HPI  History reviewed. No pertinent past medical history.  Past Surgical History:  Procedure Laterality Date  . WISDOM TOOTH EXTRACTION      Family History  Problem Relation Age of Onset  . Asthma Neg Hx   . Diabetes Neg Hx   . Heart disease Neg Hx   . Hypertension Neg Hx   . Kidney disease Neg Hx   . Stroke Neg Hx     Social History  Substance Use Topics  . Smoking status: Former Smoker    Packs/day: 0.20    Types: Cigars    Quit date: 12/05/2015  . Smokeless tobacco: Never Used  . Alcohol use No    OB History    Gravida Para Term Preterm AB Living   2 1 1   1 1    SAB TAB Ectopic Multiple Live Births     1   0 1      Review of Systems  Constitutional: Negative.   HENT: Negative.   Eyes: Negative.   Respiratory: Negative.   Cardiovascular: Negative.   Gastrointestinal: Positive for abdominal pain.  Endocrine: Negative.   Genitourinary: Positive for vaginal pain.  Skin: Negative.   Allergic/Immunologic: Negative.   Neurological: Negative.   Hematological: Negative.   Psychiatric/Behavioral: Negative.     Allergies  Patient has no known allergies.  Home Medications    BP (!) 101/58   Pulse 97   Temp 98 F (36.7 C) (Oral)   Resp 16   Physical Exam  Constitutional: She is oriented to person, place, and time. She appears well-developed and well-nourished.  HENT:  Head: Normocephalic.  Neck: Normal range of motion.  Cardiovascular: Normal rate, regular rhythm, normal heart sounds and intact distal pulses.   Pulmonary/Chest: Effort normal and breath sounds normal.  Abdominal: Soft. There is tenderness.  Genitourinary:  Genitourinary Comments: Exam shows small anterior wall hematoma apx 3cm in size, soft to palpate,  mucopurulent discharge noted  Musculoskeletal: Normal range of motion.  Neurological: She is alert and oriented to person, place, and time. She has normal reflexes.  Skin: Skin is warm and dry.  Psychiatric: She has a normal mood and affect. Her behavior is normal. Judgment and thought content normal.    MAU Course  Procedures (including critical care time)  Labs Reviewed - No data to display No results found.   1. Vaginal vault hematoma       MDM  Exam shows small anterior wall hematoma apx 3cm in size, soft to palpate, mucopurulent discharge noted, abd sl tender with exam but difficult to assess due to pt being somewhat uncooperative. VSS, Dr. Elly Modena in to evaluate pt. Pt to continue sitz baths TID and will start antibiotics. To follow up with increased swelling or fever.

## 2017-03-02 NOTE — MAU Note (Signed)
PP vag on the 23rd, has been having back pain. Clots in pads. Appears to look like placenta. Reports she bled a lot after delivery.

## 2017-03-03 ENCOUNTER — Encounter (HOSPITAL_COMMUNITY): Payer: Self-pay | Admitting: Emergency Medicine

## 2017-03-03 ENCOUNTER — Emergency Department (HOSPITAL_COMMUNITY)
Admission: EM | Admit: 2017-03-03 | Discharge: 2017-03-03 | Disposition: A | Discharge status: Home / Self Care | Payer: Medicaid Other | Attending: Emergency Medicine | Admitting: Emergency Medicine

## 2017-03-03 DIAGNOSIS — F1290 Cannabis use, unspecified, uncomplicated: Secondary | ICD-10-CM | POA: Insufficient documentation

## 2017-03-03 DIAGNOSIS — Z87891 Personal history of nicotine dependence: Secondary | ICD-10-CM | POA: Insufficient documentation

## 2017-03-03 DIAGNOSIS — N898 Other specified noninflammatory disorders of vagina: Secondary | ICD-10-CM

## 2017-03-03 DIAGNOSIS — R102 Pelvic and perineal pain: Secondary | ICD-10-CM | POA: Diagnosis present

## 2017-03-03 DIAGNOSIS — K644 Residual hemorrhoidal skin tags: Secondary | ICD-10-CM | POA: Diagnosis not present

## 2017-03-03 LAB — I-STAT CHEM 8, ED
BUN: 5 mg/dL — AB (ref 6–20)
CALCIUM ION: 1.16 mmol/L (ref 1.15–1.40)
Chloride: 107 mmol/L (ref 101–111)
Creatinine, Ser: 0.5 mg/dL (ref 0.44–1.00)
Glucose, Bld: 106 mg/dL — ABNORMAL HIGH (ref 65–99)
HEMATOCRIT: 31 % — AB (ref 36.0–46.0)
HEMOGLOBIN: 10.5 g/dL — AB (ref 12.0–15.0)
Potassium: 3.6 mmol/L (ref 3.5–5.1)
SODIUM: 141 mmol/L (ref 135–145)
TCO2: 24 mmol/L (ref 0–100)

## 2017-03-03 MED ORDER — HYDROCORTISONE 2.5 % RE CREA: TOPICAL_CREAM | RECTAL | 0 refills | Status: DC

## 2017-03-03 NOTE — ED Provider Notes (Signed)
[facility] DEPT Provider Note   CSN: [id number] Arrival date & time: 03/03/17  1659   By signing my name below, I, Tricia Campbell, attest that this documentation has been prepared under the direction and in the presence of Malvin Johns, MD. Electronically signed, Tricia Campbell, ED Scribe. 03/03/17. 5:55 PM.   History   Chief Complaint Chief Complaint  Patient presents with  . Vaginal Pain  . Hemorrhoids  . Back Pain   The history is provided by the patient and medical records. No language interpreter was used.    Tricia Campbell is a 26 y.o. female presenting to the Emergency Department concerning vaginal pain onset yesterday that. Pt reports a bump on the rectum that she felt when wiping, yellow discharge with vaginal bleeding and something "hanging out" of the vagina since onset. Pt seen at MAU for these symptoms yesterday, diagnosed with a vaginal wall hematoma started on keflex for purulent vaginal discharge and advised of symptomatic relief at home. Pt states advised sitz baths have not provided adequate relief to her concerns. She states she gave birth on 02/25/2017 with no other complications noted during or following delivery. No other complaints at this time.   Past Medical History:  Diagnosis Date  . Vaginal delivery     Patient Active Problem List   Diagnosis Date Noted  . SVD (spontaneous vaginal delivery) 02/25/2017  . Postpartum hemorrhage, delivered 02/25/2017  . 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 Encounter for supervision of normal first pregnancy in third trimester 08/14/2016    Past Surgical History:  Procedure Laterality Date  . WISDOM TOOTH EXTRACTION      OB History    Gravida Para Term Preterm AB Living   2 1 1   1 1    SAB TAB Ectopic Multiple Live Births     1   0 1       Home Medications    Prior to Admission medications   Medication Sig Start Date End Date Taking? Authorizing Provider  cephALEXin (KEFLEX) 500 MG capsule Take 1  capsule (500 mg total) by mouth 4 (four) times daily. 03/02/17   Keitha Butte, CNM  hydrocortisone (ANUSOL-HC) 2.5 % rectal cream Apply rectally 2 times daily 03/03/17   Malvin Johns, MD  ibuprofen (ADVIL,MOTRIN) 600 MG tablet Take 1 tablet (600 mg total) by mouth every 6 (six) hours. 02/27/17   Shirley, [place], DO  Prenatal MV-Min-FA-Omega-3 (PRENATAL GUMMIES/DHA & FA) 0.4-32.5 MG CHEW Chew 2 each by mouth daily.    [provider]    Family History Family History  Problem Relation Age of Onset  . Asthma Neg Hx   . Diabetes Neg Hx   . Heart disease Neg Hx   . Hypertension Neg Hx   . Kidney disease Neg Hx   . Stroke Neg Hx     Social History Social History  Substance Use Topics  . Smoking status: Former Smoker    Packs/day: 0.20    Types: Cigars    Quit date: 12/05/2015  . Smokeless tobacco: Never Used  . Alcohol use No     Allergies   Patient has no known allergies.   Review of Systems Review of Systems  Constitutional: Negative for fever.  Respiratory: Negative for shortness of breath.   Gastrointestinal: Positive for anal bleeding and rectal pain. Negative for nausea and vomiting.  Genitourinary: Positive for vaginal pain.  Musculoskeletal: Positive for back pain.  Skin: Negative for color change and wound.  Allergic/Immunologic: Negative for immunocompromised state.  Neurological: Negative for weakness and numbness.  Hematological: Does not bruise/bleed easily.  All other systems reviewed and are negative.    Physical Exam Updated Vital Signs BP 110/82 (BP Location: Left Arm)   Pulse (!) 103   Temp 98.6 F (37 C) (Oral)   Resp 16   Ht 5\' 6"  (1.676 m)   Wt 130 lb (59 kg)   SpO2 99%   Breastfeeding? No   BMI 20.98 kg/m   Physical Exam  Constitutional: She is oriented to person, place, and time. She appears well-developed and well-nourished.  HENT:  Head: Normocephalic and atraumatic.  Eyes: Pupils are equal, round, and reactive to light.    Neck: Normal range of motion. Neck supple.  Cardiovascular: Normal rate, regular rhythm and normal heart sounds.   Pulmonary/Chest: Effort normal and breath sounds normal. No respiratory distress. She has no wheezes. She has no rales. She exhibits no tenderness.  Abdominal: Soft. Bowel sounds are normal. There is no tenderness. There is no rebound and no guarding.  Genitourinary:  Genitourinary Comments: Patient has a normal appearing external vaginal exam. I did not do an internal exam as she had a pelvic exam done yesterday. There's a small amount of brownish discharge. There is no vaginal bleeding. No evidence of prolapse. There is also a small nonthrombosed external hemorrhoid.  Musculoskeletal: Normal range of motion. She exhibits no edema.  Lymphadenopathy:    She has no cervical adenopathy.  Neurological: She is alert and oriented to person, place, and time.  Skin: Skin is warm and dry. No rash noted.  Psychiatric: She has a normal mood and affect.     ED Treatments / Results  DIAGNOSTIC STUDIES: Oxygen Saturation is 99% on RA, NL by my interpretation.    COORDINATION OF CARE: 5:44 PM-Discussed next steps with pt. Pt verbalized understanding and is agreeable with the plan. Pt prepared for female exam.   Labs (all labs ordered are listed, but only abnormal results are displayed) Labs Reviewed  I-STAT CHEM 8, ED - Abnormal; Notable for the following:       Result Value   BUN 5 (*)    Glucose, Bld 106 (*)    Hemoglobin 10.5 (*)    HCT 31.0 (*)    All other components within normal limits    EKG  EKG Interpretation None       Radiology No results found.  Procedures Procedures (including critical care time)  Medications Ordered in ED Medications - No data to display   Initial Impression / Assessment and Plan / ED Course  I have reviewed the triage vital signs and the nursing notes.  Pertinent labs & imaging results that were available during my care of the  patient were reviewed by me and considered in my medical decision making (see chart for details).     Patient is a 26 year old female who presents with vaginal pain and discharge. She had a complete exam yesterday at the MAU. They diagnosed her with a vaginal hematoma. She's concerned because she feels like it's protruding out of her vagina. I don't see any evidence of prolapse. It's possible when she stands that the hematoma can be pushing out of the introitus. She is taking Keflex which she started yesterday. She doesn't have a significant abdominal pain. No fevers. I don't feel that we need to change her treatment at this point. She was provided reassurance and advised to continue the course that was started yesterday. She was advised if she has  any ongoing or worsening symptoms to either return to the [facility] or follow-up with her OB/GYN at the [facility] outpatient clinic. She was advised that she does seem to be seen if she develops worsening pain or fevers.  Final Clinical Impressions(s) / ED Diagnoses   Final diagnoses:  Vaginal vault hematoma  External hemorrhoid    New Prescriptions Discharge Medication List as of 03/03/2017  6:13 PM    START taking these medications   Details  hydrocortisone (ANUSOL-HC) 2.5 % rectal cream Apply rectally 2 times daily, Print      I personally performed the services described in this documentation, which was scribed in my presence.  The recorded information has been reviewed and considered.    Malvin Johns, MD 03/03/17 [phone]

## 2017-03-03 NOTE — ED Triage Notes (Signed)
Pt had vaginal delivery on 7/23.  C/o vaginal pain and states something is "hanging out" of her vagina since yesterday.  Also reports hemorrhoid and believes she may have excessive vaginal bleeding.  Reports large pad saturation within 20 min.

## 2017-03-03 NOTE — ED Notes (Signed)
Declined W/C at D/C and was escorted to lobby by RN. 

## 2017-03-05 ENCOUNTER — Encounter: Payer: Self-pay | Admitting: Obstetrics and Gynecology

## 2017-03-05 ENCOUNTER — Ambulatory Visit (INDEPENDENT_AMBULATORY_CARE_PROVIDER_SITE_OTHER): Payer: Medicaid Other | Admitting: Obstetrics and Gynecology

## 2017-03-05 VITALS — BP 121/72 | HR 83 | Ht 66.0 in | Wt 127.5 lb

## 2017-03-05 DIAGNOSIS — Z1389 Encounter for screening for other disorder: Secondary | ICD-10-CM

## 2017-03-05 DIAGNOSIS — N898 Other specified noninflammatory disorders of vagina: Secondary | ICD-10-CM

## 2017-03-05 NOTE — Progress Notes (Signed)
26 yo G2P1011 s/p SVD on 7/23 presenting for the evaluation of a vaginal bulge. Patient was seen on 7/28 with the same complaint. She feels that the bulge has increased in size. She has continued the sitz bath and her keflex. Patient has not been sexually active. She received depo-provera last week. She is formula feeding  Past Medical History:  Diagnosis Date  . Vaginal delivery    Past Surgical History:  Procedure Laterality Date  . WISDOM TOOTH EXTRACTION     Family History  Problem Relation Age of Onset  . Asthma Neg Hx   . Diabetes Neg Hx   . Heart disease Neg Hx   . Hypertension Neg Hx   . Kidney disease Neg Hx   . Stroke Neg Hx    Social History  Substance Use Topics  . Smoking status: Former Smoker    Packs/day: 0.20    Types: Cigars    Quit date: 12/05/2015  . Smokeless tobacco: Never Used  . Alcohol use No   ROS See pertinent in HPI  Blood pressure 121/72, pulse 83, height 5\' 6"  (1.676 m), weight 127 lb 8 oz (57.8 kg), not currently breastfeeding. GENERAL: Well-developed, well-nourished female in no acute distress.  ABDOMEN: Soft, nontender, nondistended. Palpable postpartum uterus PELVIC: Normal external female genitalia. Vagina is pink and rugated with healing vaginal laceration and visible suture material.  Normal discharge. Previously seen 3 cm anterior vaginal wall hematoma is no longer visible. 2 small hemorrhoids EXTREMITIES: No cyanosis, clubbing, or edema, 2+ distal pulses.  A/P 26 yo here for follow up on vaginal wall hematoma - Reassurance provided to the patient - Picture of vagina taken with the patient's phone as she was skeptical.  - Discussed hemorrhoidal care - Advised to complete antibiotic course - follow up as scheduled for postpartum exam

## 2017-04-01 ENCOUNTER — Other Ambulatory Visit (HOSPITAL_COMMUNITY)
Admission: RE | Admit: 2017-04-01 | Discharge: 2017-04-01 | Disposition: A | Discharge status: Home / Self Care | Payer: Medicaid Other | Source: Ambulatory Visit | Attending: Obstetrics & Gynecology | Admitting: Obstetrics & Gynecology

## 2017-04-01 DIAGNOSIS — Z01419 Encounter for gynecological examination (general) (routine) without abnormal findings: Secondary | ICD-10-CM | POA: Insufficient documentation

## 2017-04-02 ENCOUNTER — Encounter: Payer: Self-pay | Admitting: General Practice

## 2017-04-02 ENCOUNTER — Encounter: Payer: Self-pay | Admitting: Certified Nurse Midwife

## 2017-04-02 ENCOUNTER — Ambulatory Visit (INDEPENDENT_AMBULATORY_CARE_PROVIDER_SITE_OTHER): Payer: Medicaid Other | Admitting: Certified Nurse Midwife

## 2017-04-02 VITALS — BP 122/73 | HR 75 | Ht 66.0 in | Wt 134.1 lb

## 2017-04-02 DIAGNOSIS — Z01419 Encounter for gynecological examination (general) (routine) without abnormal findings: Secondary | ICD-10-CM | POA: Diagnosis not present

## 2017-04-02 DIAGNOSIS — Z3689 Encounter for other specified antenatal screening: Secondary | ICD-10-CM | POA: Diagnosis not present

## 2017-04-02 DIAGNOSIS — Z3480 Encounter for supervision of other normal pregnancy, unspecified trimester: Secondary | ICD-10-CM

## 2017-04-02 NOTE — Progress Notes (Signed)
Opened in error

## 2017-04-02 NOTE — Progress Notes (Signed)
Subjective:     Tricia Campbell is a 26 y.o. female who presents for a postpartum visit. She is 5 weeks postpartum following a spontaneous vaginal delivery. I have fully reviewed the prenatal and intrapartum course. The delivery was at 72 gestational weeks. Outcome: spontaneous vaginal delivery. Anesthesia: none. Postpartum course has been complicated by vaginal hematoma at 5 days postpartum. Baby's course has been uncomplicated. Baby is feeding by bottle - Similac Advance. Bleeding staining only. Bowel function is normal. Bladder function is normal. Patient is sexually active. Contraception method is Depo-Provera injections. Postpartum depression screening: negative.  The following portions of the patient's history were reviewed and updated as appropriate: allergies, current medications, past family history, past medical history, past social history, past surgical history and problem list.  Review of Systems Pertinent items are noted in HPI.   Objective:    BP 122/73   Pulse 75   Ht 5\' 6"  (1.676 m)   Wt 134 lb 1.6 oz (60.8 kg)   Breastfeeding? No   BMI 21.64 kg/m   General:  alert, cooperative and no distress   Breasts:    Lungs: nml rate and effort  Heart:  nml rate  Abdomen:    Vulva:  normal  Vagina: normal vagina and well healed, hematoma resolved  Cervix:  anteverted and nml  Corpus:   Adnexa:    Rectal Exam:         Assessment:      Normal postpartum exam. Pap smear done at today's visit.   Plan:      1. Contraception: Depo-Provera injections 2. Follow up in: for Depo shot in October or as needed.

## 2017-04-03 LAB — CYTOLOGY - PAP: DIAGNOSIS: NEGATIVE

## 2017-05-09 ENCOUNTER — Inpatient Hospital Stay (HOSPITAL_COMMUNITY)
Admission: AD | Admit: 2017-05-09 | Discharge: 2017-05-09 | Discharge status: Absent without leave | Payer: Medicaid Other | Source: Ambulatory Visit | Attending: Obstetrics and Gynecology | Admitting: Obstetrics and Gynecology

## 2017-05-10 ENCOUNTER — Encounter (HOSPITAL_COMMUNITY): Payer: Self-pay | Admitting: *Deleted

## 2017-05-10 ENCOUNTER — Inpatient Hospital Stay (HOSPITAL_COMMUNITY)
Admission: AD | Admit: 2017-05-10 | Discharge: 2017-05-10 | Disposition: A | Discharge status: Home / Self Care | Payer: Medicaid Other | Source: Ambulatory Visit | Attending: Obstetrics & Gynecology | Admitting: Obstetrics & Gynecology

## 2017-05-10 DIAGNOSIS — R102 Pelvic and perineal pain: Secondary | ICD-10-CM

## 2017-05-10 DIAGNOSIS — Z202 Contact with and (suspected) exposure to infections with a predominantly sexual mode of transmission: Secondary | ICD-10-CM | POA: Diagnosis not present

## 2017-05-10 DIAGNOSIS — Z87891 Personal history of nicotine dependence: Secondary | ICD-10-CM | POA: Diagnosis not present

## 2017-05-10 DIAGNOSIS — R1030 Lower abdominal pain, unspecified: Secondary | ICD-10-CM | POA: Diagnosis present

## 2017-05-10 DIAGNOSIS — Z3202 Encounter for pregnancy test, result negative: Secondary | ICD-10-CM | POA: Diagnosis not present

## 2017-05-10 LAB — WET PREP, GENITAL
SPERM: NONE SEEN
TRICH WET PREP: NONE SEEN
Yeast Wet Prep HPF POC: NONE SEEN

## 2017-05-10 LAB — CBC
HCT: 35.8 % — ABNORMAL LOW (ref 36.0–46.0)
HEMOGLOBIN: 11.5 g/dL — AB (ref 12.0–15.0)
MCH: 28.4 pg (ref 26.0–34.0)
MCHC: 32.1 g/dL (ref 30.0–36.0)
MCV: 88.4 fL (ref 78.0–100.0)
Platelets: 292 10*3/uL (ref 150–400)
RBC: 4.05 MIL/uL (ref 3.87–5.11)
RDW: 16.5 % — ABNORMAL HIGH (ref 11.5–15.5)
WBC: 11.6 10*3/uL — ABNORMAL HIGH (ref 4.0–10.5)

## 2017-05-10 LAB — URINALYSIS, ROUTINE W REFLEX MICROSCOPIC
BACTERIA UA: NONE SEEN
BILIRUBIN URINE: NEGATIVE
Glucose, UA: NEGATIVE mg/dL
Ketones, ur: NEGATIVE mg/dL
NITRITE: NEGATIVE
PROTEIN: 100 mg/dL — AB
Specific Gravity, Urine: 1.024 (ref 1.005–1.030)
pH: 5 (ref 5.0–8.0)

## 2017-05-10 LAB — POCT PREGNANCY, URINE: PREG TEST UR: NEGATIVE

## 2017-05-10 MED ORDER — CEFTRIAXONE SODIUM 250 MG IJ SOLR
250.0000 mg | Freq: Once | INTRAMUSCULAR | Status: AC
  Administered 2017-05-10: 250 mg via INTRAMUSCULAR
  Filled 2017-05-10: qty 250

## 2017-05-10 MED ORDER — AZITHROMYCIN 250 MG PO TABS
1000.0000 mg | ORAL_TABLET | Freq: Once | ORAL | Status: AC
  Administered 2017-05-10: 1000 mg via ORAL
  Filled 2017-05-10: qty 4

## 2017-05-10 NOTE — MAU Provider Note (Signed)
Chief Complaint: Abdominal Pain and Exposure to STD   None     SUBJECTIVE HPI: Tricia Campbell is a 26 y.o. G2P1011 who is s/p NSVD 2 months ago presents to maternity admissions reporting lower abdominal cramping and known exposure to gonorrhea.  She reports pain x 3 days that is mild crampy intermittent pain not associated with any vaginal discharge or nausea/vomiting.   She has not tried any treatments.  She started having vaginal bleeding today c/w her usual periods. She is on Depo Provera for birth control.  Her boyfriend told her today that he tested positive for gonorrhea. She is tearful and upset, wants to be tested and denies any risks of reexposure because she does not plan to have intercourse with him again. She denies vaginal itching/burning, urinary symptoms, h/a, dizziness, n/v, or fever/chills.     HPI  Past Medical History:  Diagnosis Date  . Vaginal delivery    Past Surgical History:  Procedure Laterality Date  . WISDOM TOOTH EXTRACTION     Social History   Social History  . Marital status: Single    Spouse name: N/A  . Number of children: N/A  . Years of education: N/A   Occupational History  . Not on file.   Social History Main Topics  . Smoking status: Former Smoker    Packs/day: 0.20    Types: Cigars    Quit date: 12/05/2015  . Smokeless tobacco: Never Used  . Alcohol use No  . Drug use: No  . Sexual activity: Yes    Birth control/ protection: Injection     Comment: Depo   Other Topics Concern  . Not on file   Social History Narrative  . No narrative on file   No current facility-administered medications on file prior to encounter.    No current outpatient prescriptions on file prior to encounter.   No Known Allergies  ROS:  Review of Systems  Constitutional: Negative for chills, fatigue and fever.  Respiratory: Negative for shortness of breath.   Cardiovascular: Negative for chest pain.  Gastrointestinal: Positive for abdominal  pain.  Genitourinary: Positive for pelvic pain and vaginal bleeding. Negative for difficulty urinating, dysuria, flank pain, vaginal discharge and vaginal pain.  Musculoskeletal: Negative for back pain.  Neurological: Negative for dizziness and headaches.  Psychiatric/Behavioral: Negative.      I have reviewed patient's Past Medical Hx, Surgical Hx, Family Hx, Social Hx, medications and allergies.   Physical Exam   Patient Vitals for the past 24 hrs:  BP Temp Temp src Pulse Resp SpO2 Weight  05/10/17 1513 109/60 - - 75 16 - -  05/10/17 1235 103/70 98.1 F (36.7 C) Oral 83 18 100 % 137 lb 4 oz (62.3 kg)   Constitutional: Well-developed, well-nourished female in no acute distress.  Cardiovascular: normal rate Respiratory: normal effort GI: Abd soft, non-tender. Pos BS x 4 MS: Extremities nontender, no edema, normal ROM Neurologic: Alert and oriented x 4.  GU: Neg CVAT.  PELVIC EXAM: Cervix pink, visually closed, without lesion, small amount thick yellow discharge, vaginal walls and external genitalia normal Bimanual exam: Cervix 0/long/high, firm, anterior, neg CMT, uterus tender, nonenlarged, adnexa without tenderness, enlargement, or mass   LAB RESULTS Results for orders placed or performed during the hospital encounter of 05/10/17 (from the past 24 hour(s))  Urinalysis, Routine w reflex microscopic     Status: Abnormal   Collection Time: 05/10/17 12:40 PM  Result Value Ref Range   Color, Urine YELLOW YELLOW  APPearance HAZY (A) CLEAR   Specific Gravity, Urine 1.024 1.005 - 1.030   pH 5.0 5.0 - 8.0   Glucose, UA NEGATIVE NEGATIVE mg/dL   Hgb urine dipstick MODERATE (A) NEGATIVE   Bilirubin Urine NEGATIVE NEGATIVE   Ketones, ur NEGATIVE NEGATIVE mg/dL   Protein, ur 100 (A) NEGATIVE mg/dL   Nitrite NEGATIVE NEGATIVE   Leukocytes, UA LARGE (A) NEGATIVE   RBC / HPF TOO NUMEROUS TO COUNT 0 - 5 RBC/hpf   WBC, UA TOO NUMEROUS TO COUNT 0 - 5 WBC/hpf   Bacteria, UA NONE SEEN  NONE SEEN   Squamous Epithelial / LPF 0-5 (A) NONE SEEN   Mucus PRESENT   Pregnancy, urine POC     Status: None   Collection Time: 05/10/17 12:57 PM  Result Value Ref Range   Preg Test, Ur NEGATIVE NEGATIVE  Wet prep, genital     Status: Abnormal   Collection Time: 05/10/17  2:40 PM  Result Value Ref Range   Yeast Wet Prep HPF POC NONE SEEN NONE SEEN   Trich, Wet Prep NONE SEEN NONE SEEN   Clue Cells Wet Prep HPF POC PRESENT (A) NONE SEEN   WBC, Wet Prep HPF POC MANY (A) NONE SEEN   Sperm NONE SEEN   CBC     Status: Abnormal   Collection Time: 05/10/17  2:41 PM  Result Value Ref Range   WBC 11.6 (H) 4.0 - 10.5 K/uL   RBC 4.05 3.87 - 5.11 MIL/uL   Hemoglobin 11.5 (L) 12.0 - 15.0 g/dL   HCT 35.8 (L) 36.0 - 46.0 %   MCV 88.4 78.0 - 100.0 fL   MCH 28.4 26.0 - 34.0 pg   MCHC 32.1 30.0 - 36.0 g/dL   RDW 16.5 (H) 11.5 - 15.5 %   Platelets 292 150 - 400 K/uL    B/POS/-- (01/09 0953)  IMAGING No results found.  MAU Management/MDM: Ordered labs and reviewed results.  Pt with known exposure so will treat for gonorrhea/chlamydia with Rocpehin 250 mg IM and azithromycin 1000 mg PO in MAU today.  GCC, wet prep, HIV, RPR pending.  Urine sent for culture with large leukocytes but nitrite negative, may be from vaginal infection.  Pt requested oral GC swab so collected today as well with results pending.  Pt stable at time of discharge.  ASSESSMENT 1. Exposure to gonorrhea   2. Acute pelvic pain, female     PLAN Discharge home Allergies as of 05/10/2017   No Known Allergies     Medication List    STOP taking these medications   cephALEXin 500 MG capsule Commonly known as:  KEFLEX   hydrocortisone 2.5 % rectal cream Commonly known as:  ANUSOL-HC     TAKE these medications   ibuprofen 200 MG tablet Commonly known as:  ADVIL,MOTRIN Take 800 mg by mouth every 6 (six) hours as needed. What changed:  Another medication with the same name was removed. Continue taking this  medication, and follow the directions you see here.      Follow-up [place] for [place] Follow up.   Specialty:  Obstetrics and Gynecology Why:  For Depo Provera or other forms of contraception as desired Contact information: [address] [place] Certified Nurse-Midwife 05/10/2017  9:04 PM

## 2017-05-10 NOTE — MAU Note (Addendum)
Been having sharp pains in lower stomach.  Started yesterday.  Found out partner had tested  Gonorrhea.  Del 7/23 vag. On cycle

## 2017-05-11 LAB — HIV ANTIBODY (ROUTINE TESTING W REFLEX): HIV Screen 4th Generation wRfx: NONREACTIVE

## 2017-05-11 LAB — URINE CULTURE

## 2017-05-11 LAB — RPR: RPR Ser Ql: NONREACTIVE

## 2017-05-13 LAB — GC/CHLAMYDIA PROBE AMP (~~LOC~~) NOT AT ARMC
Chlamydia: NEGATIVE
Chlamydia: NEGATIVE
NEISSERIA GONORRHEA: POSITIVE — AB
Neisseria Gonorrhea: NEGATIVE

## 2017-05-16 ENCOUNTER — Ambulatory Visit: Payer: Medicaid Other

## 2017-05-17 ENCOUNTER — Ambulatory Visit (INDEPENDENT_AMBULATORY_CARE_PROVIDER_SITE_OTHER): Payer: Medicaid Other | Admitting: General Practice

## 2017-05-17 VITALS — Ht 66.0 in | Wt 141.6 lb

## 2017-05-17 DIAGNOSIS — Z3042 Encounter for surveillance of injectable contraceptive: Secondary | ICD-10-CM | POA: Diagnosis not present

## 2017-05-17 MED ORDER — MEDROXYPROGESTERONE ACETATE 150 MG/ML IM SUSP
150.0000 mg | Freq: Once | INTRAMUSCULAR | Status: AC
  Administered 2017-05-17: 150 mg via INTRAMUSCULAR

## 2017-05-31 ENCOUNTER — Other Ambulatory Visit (HOSPITAL_COMMUNITY)
Admission: RE | Admit: 2017-05-31 | Discharge: 2017-05-31 | Disposition: A | Discharge status: Home / Self Care | Payer: Medicaid Other | Source: Ambulatory Visit | Attending: Family Medicine | Admitting: Family Medicine

## 2017-05-31 ENCOUNTER — Ambulatory Visit (INDEPENDENT_AMBULATORY_CARE_PROVIDER_SITE_OTHER): Payer: Medicaid Other | Admitting: Family Medicine

## 2017-05-31 ENCOUNTER — Encounter: Payer: Self-pay | Admitting: Family Medicine

## 2017-05-31 VITALS — BP 117/76 | HR 64 | Wt 140.0 lb

## 2017-05-31 DIAGNOSIS — Z113 Encounter for screening for infections with a predominantly sexual mode of transmission: Secondary | ICD-10-CM | POA: Insufficient documentation

## 2017-05-31 NOTE — Patient Instructions (Signed)

## 2017-05-31 NOTE — Progress Notes (Signed)
   Subjective:    Patient ID: Tricia Campbell is a 26 y.o. female presenting with Follow-up  on 05/31/2017  HPI: Had GC exposure. + on culture and treated. For TOC today. Denies vaginal discharge, abdominal pain, fever, chills. On Depo, going well. Had pap 8/18 and WNL. Other STD screens negative earlier this month.  Review of Systems  Constitutional: Negative for chills and fever.  Respiratory: Negative for shortness of breath.   Cardiovascular: Negative for chest pain.  Gastrointestinal: Negative for abdominal pain, nausea and vomiting.  Genitourinary: Negative for dysuria.  Skin: Negative for rash.      Objective:    BP 117/76   Pulse 64   Wt 140 lb (63.5 kg)   LMP 05/05/2017   BMI 22.60 kg/m  Physical Exam  Constitutional: She is oriented to person, place, and time. She appears well-developed and well-nourished. No distress.  HENT:  Head: Normocephalic and atraumatic.  Eyes: No scleral icterus.  Neck: Neck supple.  Cardiovascular: Normal rate.   Pulmonary/Chest: Effort normal.  Abdominal: Soft.  Genitourinary: Vaginal discharge (white) found.  Neurological: She is alert and oriented to person, place, and time.  Skin: Skin is warm and dry.  Psychiatric: She has a normal mood and affect.        Assessment & Plan:  Screen for STD (sexually transmitted disease) - TOC sent in today--will inform patient of results via [place]. - Plan: Cervicovaginal ancillary only   Return if symptoms worsen or fail to improve.  Donnamae Jude 05/31/2017 10:19 AM

## 2017-06-03 LAB — CERVICOVAGINAL ANCILLARY ONLY
Chlamydia: NEGATIVE
NEISSERIA GONORRHEA: NEGATIVE

## 2017-08-02 ENCOUNTER — Ambulatory Visit: Payer: Medicaid Other

## 2017-08-07 ENCOUNTER — Ambulatory Visit: Payer: Medicaid Other

## 2017-08-13 ENCOUNTER — Ambulatory Visit: Payer: Medicaid Other

## 2017-08-19 ENCOUNTER — Ambulatory Visit (INDEPENDENT_AMBULATORY_CARE_PROVIDER_SITE_OTHER): Payer: Medicaid Other | Admitting: *Deleted

## 2017-08-19 ENCOUNTER — Telehealth: Payer: Self-pay | Admitting: General Practice

## 2017-08-19 DIAGNOSIS — N898 Other specified noninflammatory disorders of vagina: Secondary | ICD-10-CM

## 2017-08-19 DIAGNOSIS — Z3042 Encounter for surveillance of injectable contraceptive: Secondary | ICD-10-CM | POA: Diagnosis present

## 2017-08-19 DIAGNOSIS — Z3202 Encounter for pregnancy test, result negative: Secondary | ICD-10-CM

## 2017-08-19 LAB — POCT PREGNANCY, URINE: Preg Test, Ur: NEGATIVE

## 2017-08-19 MED ORDER — MEDROXYPROGESTERONE ACETATE 150 MG/ML IM SUSP
150.0000 mg | Freq: Once | INTRAMUSCULAR | Status: AC
  Administered 2017-08-19: 150 mg via INTRAMUSCULAR

## 2017-08-19 MED ORDER — FLUCONAZOLE 150 MG PO TABS: 150.0000 mg | ORAL_TABLET | Freq: Once | ORAL | 0 refills | Status: AC

## 2017-08-19 NOTE — Telephone Encounter (Signed)
Attempted to call patient in regards to follow up Depo Provera injection.  Unable to leave message on VM.  However patient does have MyChart.

## 2017-08-19 NOTE — Progress Notes (Signed)
Here for depo-provera, was late , has been 13w3 days. UPt done today, negative.  Also c/o vaginal  Irritation, also c/o creamy, white vaginal discharge. Does not want to do self swab, states she will make appointment. Asked if I can give her something for itching. Diflucan x1 sent to her pharmacy. Instructed patient if itching doesn't get better after diflucan to make appointment as she could have other infection. She voices understanding.

## 2017-09-06 ENCOUNTER — Ambulatory Visit: Payer: Medicaid Other

## 2017-09-06 ENCOUNTER — Other Ambulatory Visit (HOSPITAL_COMMUNITY)
Admission: RE | Admit: 2017-09-06 | Discharge: 2017-09-06 | Disposition: A | Discharge status: Home / Self Care | Payer: Medicaid Other | Source: Ambulatory Visit | Attending: Obstetrics and Gynecology | Admitting: Obstetrics and Gynecology

## 2017-09-06 VITALS — BP 103/61 | HR 94

## 2017-09-06 DIAGNOSIS — A749 Chlamydial infection, unspecified: Secondary | ICD-10-CM | POA: Diagnosis not present

## 2017-09-06 DIAGNOSIS — Z113 Encounter for screening for infections with a predominantly sexual mode of transmission: Secondary | ICD-10-CM

## 2017-09-06 NOTE — Progress Notes (Signed)
I have reviewed this chart and agree with the RN/CMA assessment and management.    K. Meryl Davis, M.D. Attending Obstetrician & Gynecologist, [facility], [facility]  

## 2017-09-06 NOTE — Progress Notes (Signed)
Pt presented to the office for STD check. Pt states that she has been having discharge x 2 weeks. Pt denies any itching and odor. Pt refused to self swab but agreed to leave a urine sample. Pt states that she would like for the Dr. to swab her. Pt states that she will f/u with the Dr. If she notices any other symptoms.

## 2017-09-07 LAB — HIV ANTIBODY (ROUTINE TESTING W REFLEX): HIV Screen 4th Generation wRfx: NONREACTIVE

## 2017-09-07 LAB — SYPHILIS: RPR W/REFLEX TO RPR TITER AND TREPONEMAL ANTIBODIES, TRADITIONAL SCREENING AND DIAGNOSIS ALGORITHM: RPR Ser Ql: NONREACTIVE

## 2017-09-09 ENCOUNTER — Ambulatory Visit: Payer: Medicaid Other | Admitting: Obstetrics & Gynecology

## 2017-09-09 LAB — CERVICOVAGINAL ANCILLARY ONLY
CHLAMYDIA, DNA PROBE: POSITIVE — AB
NEISSERIA GONORRHEA: NEGATIVE
Trichomonas: NEGATIVE

## 2017-09-10 ENCOUNTER — Other Ambulatory Visit: Payer: Self-pay | Admitting: Obstetrics and Gynecology

## 2017-09-10 MED ORDER — AZITHROMYCIN 500 MG PO TABS: 1000.0000 mg | ORAL_TABLET | Freq: Once | ORAL | 0 refills | Status: AC

## 2017-09-10 NOTE — Progress Notes (Signed)
Script sent for azithromycin to pharmacy.

## 2017-09-11 ENCOUNTER — Telehealth: Payer: Self-pay

## 2017-09-11 ENCOUNTER — Encounter: Payer: Self-pay | Admitting: *Deleted

## 2017-09-11 DIAGNOSIS — A749 Chlamydial infection, unspecified: Secondary | ICD-10-CM

## 2017-09-11 MED ORDER — AZITHROMYCIN 250 MG PO TABS: 1000.0000 mg | ORAL_TABLET | Freq: Once | ORAL | 0 refills | Status: AC

## 2017-09-11 NOTE — Telephone Encounter (Signed)
Called pt twice to inform her of her positive chlamydia results. Pt does not have a voicemail set up. Medication was sent to pharmacy and STD report was sent to [facility].

## 2017-09-16 ENCOUNTER — Ambulatory Visit (INDEPENDENT_AMBULATORY_CARE_PROVIDER_SITE_OTHER): Payer: Medicaid Other | Admitting: Advanced Practice Midwife

## 2017-09-16 ENCOUNTER — Other Ambulatory Visit (HOSPITAL_COMMUNITY)
Admission: RE | Admit: 2017-09-16 | Discharge: 2017-09-16 | Disposition: A | Discharge status: Home / Self Care | Payer: Medicaid Other | Source: Ambulatory Visit | Attending: Advanced Practice Midwife | Admitting: Advanced Practice Midwife

## 2017-09-16 ENCOUNTER — Encounter: Payer: Self-pay | Admitting: Advanced Practice Midwife

## 2017-09-16 ENCOUNTER — Ambulatory Visit: Payer: Medicaid Other | Admitting: Advanced Practice Midwife

## 2017-09-16 ENCOUNTER — Ambulatory Visit: Payer: Medicaid Other | Admitting: Nurse Practitioner

## 2017-09-16 VITALS — BP 129/79 | HR 76 | Wt 130.0 lb

## 2017-09-16 DIAGNOSIS — N898 Other specified noninflammatory disorders of vagina: Secondary | ICD-10-CM | POA: Diagnosis not present

## 2017-09-16 NOTE — Progress Notes (Signed)
Subjective:     Patient ID: Tricia Campbell, female   DOB: 1990/08/12, 27 y.o.   MRN: [id number]  Tricia Campbell is a 27 y.o. G2P1011 who presents today for check for BV/Yeast. She states that she has done a self swab before, but it doesn't work. She is also concenred about recent +chlamydia test. She has taken the azithromycin for that.   Vaginal Discharge  The patient's primary symptoms include vaginal discharge. The patient's pertinent negatives include no pelvic pain. This is a new problem. The current episode started 1 to 4 weeks ago. The problem occurs intermittently. The problem has been resolved. The patient is experiencing no pain. Pertinent negatives include no chills or fever. The vaginal discharge was normal. There has been no bleeding. She uses progestin injections for contraception.     Review of Systems  Constitutional: Negative for chills and fever.  Genitourinary: Positive for vaginal discharge. Negative for pelvic pain.       Objective:   Physical Exam  Constitutional: She is oriented to person, place, and time. She appears well-developed and well-nourished. No distress.  HENT:  Head: Normocephalic.  Cardiovascular: Normal rate.  Pulmonary/Chest: Effort normal.  Abdominal: Soft. There is no tenderness. There is no rebound.  Genitourinary:  Genitourinary Comments:  External: no lesion Vagina: small amount of white discharge Cervix: pink, smooth, no CMT Uterus: NSSC Adnexa: NT   Neurological: She is alert and oriented to person, place, and time.  Skin: Skin is warm and dry.  Psychiatric: She has a normal mood and affect.  Nursing note and vitals reviewed.      Assessment:     1. Vaginal discharge        Plan:      Comfort measures reviewed  Wet prep today  RX: no new rx

## 2017-09-16 NOTE — Patient Instructions (Signed)
March 31- April 14 for next depo

## 2017-09-16 NOTE — Addendum Note (Signed)
Addended by: Marcille Buffy D on: 09/16/2017 03:59 PM   Modules accepted: Orders

## 2017-09-17 LAB — CERVICOVAGINAL ANCILLARY ONLY
Bacterial vaginitis: POSITIVE — AB
CANDIDA VAGINITIS: NEGATIVE
TRICH (WINDOWPATH): NEGATIVE

## 2017-09-18 ENCOUNTER — Other Ambulatory Visit: Payer: Self-pay | Admitting: Advanced Practice Midwife

## 2017-09-18 MED ORDER — METRONIDAZOLE 500 MG PO TABS: 500.0000 mg | ORAL_TABLET | Freq: Two times a day (BID) | ORAL | 0 refills | Status: DC

## 2017-09-18 NOTE — Progress Notes (Signed)
Patient with BV. RX sent to pharmacy on file.  Tricia Campbell 8:33 PM 09/18/17

## 2017-10-23 ENCOUNTER — Ambulatory Visit: Payer: Medicaid Other | Admitting: Advanced Practice Midwife

## 2017-10-24 ENCOUNTER — Ambulatory Visit: Payer: Medicaid Other | Admitting: Student

## 2017-11-04 ENCOUNTER — Ambulatory Visit (INDEPENDENT_AMBULATORY_CARE_PROVIDER_SITE_OTHER): Payer: Medicaid Other | Admitting: General Practice

## 2017-11-04 ENCOUNTER — Other Ambulatory Visit (HOSPITAL_COMMUNITY)
Admission: RE | Admit: 2017-11-04 | Discharge: 2017-11-04 | Disposition: A | Discharge status: Home / Self Care | Payer: Medicaid Other | Source: Ambulatory Visit | Attending: Family Medicine | Admitting: Family Medicine

## 2017-11-04 VITALS — BP 93/55 | HR 75 | Ht 66.0 in | Wt 126.0 lb

## 2017-11-04 DIAGNOSIS — Z202 Contact with and (suspected) exposure to infections with a predominantly sexual mode of transmission: Secondary | ICD-10-CM

## 2017-11-04 DIAGNOSIS — Z3042 Encounter for surveillance of injectable contraceptive: Secondary | ICD-10-CM

## 2017-11-04 DIAGNOSIS — Z3202 Encounter for pregnancy test, result negative: Secondary | ICD-10-CM | POA: Diagnosis not present

## 2017-11-04 DIAGNOSIS — IMO0001 Reserved for inherently not codable concepts without codable children: Secondary | ICD-10-CM

## 2017-11-04 LAB — POCT PREGNANCY, URINE: Preg Test, Ur: NEGATIVE

## 2017-11-04 MED ORDER — MEDROXYPROGESTERONE ACETATE 150 MG/ML IM SUSP
150.0000 mg | Freq: Once | INTRAMUSCULAR | Status: AC
  Administered 2017-11-04: 150 mg via INTRAMUSCULAR

## 2017-11-04 NOTE — Progress Notes (Signed)
I have reviewed the nurse's note, and agree with the plan of care.  [name] 1:16 PM 11/04/17

## 2017-11-04 NOTE — Progress Notes (Addendum)
Patient here today for [facility] & depo shot. Patient also requests UPT "just to be on the safe side". UPT -. TOC collected from urine.  Tricia Campbell here for Depo-Provera  Injection.  Injection administered without complication. Patient will return in 3 months for next injection.  Derinda Late, RN 11/04/2017  11:42 AM

## 2017-11-06 LAB — GC/CHLAMYDIA PROBE AMP (~~LOC~~) NOT AT ARMC
Chlamydia: NEGATIVE
Neisseria Gonorrhea: NEGATIVE

## 2018-01-20 ENCOUNTER — Ambulatory Visit (INDEPENDENT_AMBULATORY_CARE_PROVIDER_SITE_OTHER): Payer: Medicaid Other

## 2018-01-20 ENCOUNTER — Other Ambulatory Visit: Payer: Self-pay

## 2018-01-20 ENCOUNTER — Other Ambulatory Visit (HOSPITAL_COMMUNITY)
Admission: RE | Admit: 2018-01-20 | Discharge: 2018-01-20 | Disposition: A | Discharge status: Home / Self Care | Payer: Medicaid Other | Source: Ambulatory Visit | Attending: Family Medicine | Admitting: Family Medicine

## 2018-01-20 VITALS — BP 99/55 | HR 94

## 2018-01-20 DIAGNOSIS — Z202 Contact with and (suspected) exposure to infections with a predominantly sexual mode of transmission: Secondary | ICD-10-CM

## 2018-01-20 DIAGNOSIS — Z3202 Encounter for pregnancy test, result negative: Secondary | ICD-10-CM | POA: Diagnosis not present

## 2018-01-20 DIAGNOSIS — Z3042 Encounter for surveillance of injectable contraceptive: Secondary | ICD-10-CM | POA: Diagnosis not present

## 2018-01-20 LAB — POCT URINE PREGNANCY: Preg Test, Ur: NEGATIVE

## 2018-01-20 MED ORDER — MEDROXYPROGESTERONE ACETATE 150 MG/ML IM SUSP
150.0000 mg | Freq: Once | INTRAMUSCULAR | Status: AC
  Administered 2018-01-20: 150 mg via INTRAMUSCULAR

## 2018-01-20 NOTE — Progress Notes (Signed)
[id number] last pap: 04/02/2017. Last Depo-Provera: 11/04/2017. Side Effects if any: n/a. Serum HCG indicated? n/a. Depo-Provera 150 mg IM given by: Libby Maw). Next appointment due Sept 2 - Sept 16.  Patient also being tested for STD.

## 2018-01-20 NOTE — Addendum Note (Signed)
Addended by: Amado Coe on: 01/20/2018 11:39 AM   Modules accepted: Orders

## 2018-01-21 LAB — GC/CHLAMYDIA PROBE AMP (~~LOC~~) NOT AT ARMC
CHLAMYDIA, DNA PROBE: NEGATIVE
NEISSERIA GONORRHEA: NEGATIVE
Trichomonas: NEGATIVE

## 2018-03-20 ENCOUNTER — Other Ambulatory Visit (HOSPITAL_COMMUNITY)
Admission: RE | Admit: 2018-03-20 | Discharge: 2018-03-20 | Disposition: A | Discharge status: Home / Self Care | Payer: Medicaid Other | Source: Ambulatory Visit | Attending: Obstetrics and Gynecology | Admitting: Obstetrics and Gynecology

## 2018-03-20 ENCOUNTER — Ambulatory Visit (INDEPENDENT_AMBULATORY_CARE_PROVIDER_SITE_OTHER): Payer: Medicaid Other

## 2018-03-20 DIAGNOSIS — Z202 Contact with and (suspected) exposure to infections with a predominantly sexual mode of transmission: Secondary | ICD-10-CM | POA: Insufficient documentation

## 2018-03-20 NOTE — Progress Notes (Signed)
Patient seen and assessed by nursing staff.  Agree with documentation and plan.  

## 2018-03-20 NOTE — Progress Notes (Signed)
Pt here for STD check advised will be available within 2 to 3 days in my chart, if can't locate she can call to get results.Pt verbalized understanding.

## 2018-03-21 LAB — CERVICOVAGINAL ANCILLARY ONLY
Bacterial vaginitis: POSITIVE — AB
CHLAMYDIA, DNA PROBE: POSITIVE — AB
Candida vaginitis: NEGATIVE
NEISSERIA GONORRHEA: NEGATIVE
Trichomonas: NEGATIVE

## 2018-03-22 ENCOUNTER — Other Ambulatory Visit: Payer: Self-pay | Admitting: Family Medicine

## 2018-03-22 DIAGNOSIS — N760 Acute vaginitis: Secondary | ICD-10-CM

## 2018-03-22 DIAGNOSIS — A749 Chlamydial infection, unspecified: Secondary | ICD-10-CM

## 2018-03-22 DIAGNOSIS — B9689 Other specified bacterial agents as the cause of diseases classified elsewhere: Secondary | ICD-10-CM

## 2018-03-22 MED ORDER — METRONIDAZOLE 500 MG PO TABS: 500.0000 mg | ORAL_TABLET | Freq: Two times a day (BID) | ORAL | 0 refills | Status: DC

## 2018-03-22 MED ORDER — AZITHROMYCIN 250 MG PO TABS: 1000.0000 mg | ORAL_TABLET | Freq: Once | ORAL | 1 refills | Status: AC

## 2018-03-24 ENCOUNTER — Encounter: Payer: Self-pay | Admitting: General Practice

## 2018-03-24 NOTE — Progress Notes (Unsigned)
Patient tested + chlamydia. STD card completed & sent to [facility]. Patient notified via mychart.

## 2018-04-01 ENCOUNTER — Ambulatory Visit (INDEPENDENT_AMBULATORY_CARE_PROVIDER_SITE_OTHER): Payer: Medicaid Other | Admitting: General Practice

## 2018-04-01 ENCOUNTER — Ambulatory Visit: Payer: Medicaid Other

## 2018-04-01 DIAGNOSIS — Z202 Contact with and (suspected) exposure to infections with a predominantly sexual mode of transmission: Secondary | ICD-10-CM

## 2018-04-01 NOTE — Progress Notes (Signed)
I have reviewed the chart and agree with nursing staff's documentation of this patient's encounter. Patient was diagnosed on 03/20/18. It has been < 2 weeks since treatment and for accurate TOC patient should wait as directed by RN staff.  Kerry Hough, PA-C 04/01/2018 4:43 PM

## 2018-04-01 NOTE — Progress Notes (Signed)
Patient presents to office today for [facility] following recent + chlamydia. Discussed with patient it is too soon to retest her and we ideally do test of cure around 4 weeks post treatment. Patient verbalized understanding & reports taking medication as directed. Patient will return in 2 weeks for TOC.

## 2018-04-03 ENCOUNTER — Encounter: Payer: Self-pay | Admitting: Student

## 2018-04-03 ENCOUNTER — Ambulatory Visit (INDEPENDENT_AMBULATORY_CARE_PROVIDER_SITE_OTHER): Payer: Medicaid Other | Admitting: Student

## 2018-04-03 VITALS — BP 128/61 | HR 108 | Wt 121.0 lb

## 2018-04-03 DIAGNOSIS — B3731 Acute candidiasis of vulva and vagina: Secondary | ICD-10-CM

## 2018-04-03 DIAGNOSIS — Z113 Encounter for screening for infections with a predominantly sexual mode of transmission: Secondary | ICD-10-CM

## 2018-04-03 DIAGNOSIS — B373 Candidiasis of vulva and vagina: Secondary | ICD-10-CM | POA: Diagnosis not present

## 2018-04-03 MED ORDER — FLUCONAZOLE 150 MG PO TABS: 150.0000 mg | ORAL_TABLET | Freq: Every day | ORAL | 1 refills | Status: DC

## 2018-04-03 NOTE — Progress Notes (Signed)
History:  [name] is a 27 y.o. G2P1011 who presents to clinic today for vaginal discharge and irritation. Patient treated for BV & chlamydia on 8/17. Reports since completing the antibiotics for BV has had vaginal irritation and creamy yellow discharge. Has had intercourse once a few days ago with partner who was treated; used condoms.  No change in soaps or detergents.     Patient Active Problem List   Diagnosis Date Noted  . 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 metroNIDAZOLE (FLAGYL) 500 MG tablet Take 1 tablet (500 mg total) by mouth 2 (two) times daily. 14 tablet 0   No current facility-administered medications on file prior to visit.      The following portions of the patient's history were reviewed and updated as appropriate: allergies, current medications, family history, past medical history, social history, past surgical history and problem list.  Review of Systems:  Other than those mentioned in HPI all ROS negative   Objective:  Physical Exam BP 128/61   Pulse (!) 108   Wt 121 lb (54.9 kg)   BMI 19.53 kg/m  CONSTITUTIONAL: Well-developed, well-nourished female in no acute distress.  GENITOURINARY: NEFG. Clumpy yellow discharge adherent to vaginal walls. Vaginal walls & labia minora erythematous. No external or internal lesions  Labs and Imaging Too early for GC TOC. Will tx for yeast based on exam.  Patient agreeable to HIV & RPR testing today.   Assessment & Plan:  1. Screening for STDs (sexually transmitted diseases) -Pt to f/u in 2-3 weeks for TOC - HIV antibody - RPR  2. Vaginal yeast infection  - fluconazole (DIFLUCAN) 150 MG tablet; Take 1 tablet (150 mg total) by mouth daily.  Dispense: 1 tablet; Refill: 1   Jorje Guild, NP 04/03/2018 12:11 PM

## 2018-04-03 NOTE — Patient Instructions (Signed)

## 2018-04-03 NOTE — Progress Notes (Signed)
Took Pills and Antibiotics feels like having itchiness and discomfort.

## 2018-04-04 LAB — SYPHILIS: RPR W/REFLEX TO RPR TITER AND TREPONEMAL ANTIBODIES, TRADITIONAL SCREENING AND DIAGNOSIS ALGORITHM: RPR Ser Ql: NONREACTIVE

## 2018-04-04 LAB — HIV ANTIBODY (ROUTINE TESTING W REFLEX): HIV Screen 4th Generation wRfx: NONREACTIVE

## 2018-04-08 ENCOUNTER — Ambulatory Visit: Payer: Self-pay | Admitting: Obstetrics & Gynecology

## 2018-04-08 NOTE — Progress Notes (Signed)
   Patient did not show up today for her scheduled appointment.   UGONNA  ANYANWU, MD, FACOG Obstetrician & Gynecologist, [facility], [facility]  

## 2018-04-15 ENCOUNTER — Encounter: Payer: Self-pay | Admitting: *Deleted

## 2018-04-17 ENCOUNTER — Ambulatory Visit (INDEPENDENT_AMBULATORY_CARE_PROVIDER_SITE_OTHER): Payer: Self-pay | Admitting: General Practice

## 2018-04-17 DIAGNOSIS — Z8619 Personal history of other infectious and parasitic diseases: Secondary | ICD-10-CM

## 2018-04-17 DIAGNOSIS — Z113 Encounter for screening for infections with a predominantly sexual mode of transmission: Secondary | ICD-10-CM

## 2018-04-17 NOTE — Progress Notes (Signed)
Patient presents to office today for test of cure following recent chlamydia infection. Patient instructed in self swab & specimen collected. Discussed results will be back and available in mychart usually within 24-48 hours as well as treatment if needed. Patient verbalized understanding & had no questions.

## 2018-04-18 LAB — GC/CHLAMYDIA PROBE AMP (~~LOC~~) NOT AT ARMC
Chlamydia: NEGATIVE
NEISSERIA GONORRHEA: NEGATIVE

## 2018-04-24 NOTE — Progress Notes (Signed)
ATTESTATION OF SUPERVISION OF RN: Evaluation and management procedures were performed by the RN under my supervision and collaboration. I have reviewed the nursing note and chart and agree with the management and plan for this patient.  Mallie Snooks, CNM 04/24/18  1:39 PM

## 2018-07-22 ENCOUNTER — Observation Stay (HOSPITAL_BASED_OUTPATIENT_CLINIC_OR_DEPARTMENT_OTHER)
Admission: EM | Admit: 2018-07-22 | Discharge: 2018-07-23 | Disposition: A | Discharge status: Home / Self Care | Payer: Self-pay | Attending: Surgery | Admitting: Surgery

## 2018-07-22 ENCOUNTER — Encounter (HOSPITAL_BASED_OUTPATIENT_CLINIC_OR_DEPARTMENT_OTHER): Payer: Self-pay | Admitting: *Deleted

## 2018-07-22 ENCOUNTER — Emergency Department (HOSPITAL_BASED_OUTPATIENT_CLINIC_OR_DEPARTMENT_OTHER): Payer: Self-pay

## 2018-07-22 ENCOUNTER — Other Ambulatory Visit: Payer: Self-pay

## 2018-07-22 DIAGNOSIS — Z79899 Other long term (current) drug therapy: Secondary | ICD-10-CM | POA: Insufficient documentation

## 2018-07-22 DIAGNOSIS — K529 Noninfective gastroenteritis and colitis, unspecified: Secondary | ICD-10-CM

## 2018-07-22 DIAGNOSIS — K37 Unspecified appendicitis: Principal | ICD-10-CM | POA: Insufficient documentation

## 2018-07-22 DIAGNOSIS — Z87891 Personal history of nicotine dependence: Secondary | ICD-10-CM | POA: Insufficient documentation

## 2018-07-22 DIAGNOSIS — K3580 Unspecified acute appendicitis: Secondary | ICD-10-CM

## 2018-07-22 LAB — CBC WITH DIFFERENTIAL/PLATELET
Abs Immature Granulocytes: 0.04 10*3/uL (ref 0.00–0.07)
Basophils Absolute: 0 10*3/uL (ref 0.0–0.1)
Basophils Relative: 0 %
Eosinophils Absolute: 0 10*3/uL (ref 0.0–0.5)
Eosinophils Relative: 0 %
HCT: 39.5 % (ref 36.0–46.0)
Hemoglobin: 12.5 g/dL (ref 12.0–15.0)
Immature Granulocytes: 0 %
Lymphocytes Relative: 15 %
Lymphs Abs: 1.9 10*3/uL (ref 0.7–4.0)
MCH: 30.3 pg (ref 26.0–34.0)
MCHC: 31.6 g/dL (ref 30.0–36.0)
MCV: 95.6 fL (ref 80.0–100.0)
Monocytes Absolute: 0.8 10*3/uL (ref 0.1–1.0)
Monocytes Relative: 6 %
NEUTROS PCT: 79 %
Neutro Abs: 10 10*3/uL — ABNORMAL HIGH (ref 1.7–7.7)
Platelets: 230 10*3/uL (ref 150–400)
RBC: 4.13 MIL/uL (ref 3.87–5.11)
RDW: 14.2 % (ref 11.5–15.5)
WBC: 12.8 10*3/uL — ABNORMAL HIGH (ref 4.0–10.5)
nRBC: 0 % (ref 0.0–0.2)

## 2018-07-22 LAB — COMPREHENSIVE METABOLIC PANEL
ALT: 13 U/L (ref 0–44)
AST: 18 U/L (ref 15–41)
Albumin: 4 g/dL (ref 3.5–5.0)
Alkaline Phosphatase: 44 U/L (ref 38–126)
Anion gap: 5 (ref 5–15)
BUN: 11 mg/dL (ref 6–20)
CO2: 25 mmol/L (ref 22–32)
Calcium: 8.9 mg/dL (ref 8.9–10.3)
Chloride: 105 mmol/L (ref 98–111)
Creatinine, Ser: 0.68 mg/dL (ref 0.44–1.00)
GFR calc Af Amer: >60 mL/min (ref >60)
Glucose, Bld: 92 mg/dL (ref 70–99)
POTASSIUM: 3.2 mmol/L — AB (ref 3.5–5.1)
Sodium: 135 mmol/L (ref 135–145)
Total Bilirubin: 0.8 mg/dL (ref 0.3–1.2)
Total Protein: 7.6 g/dL (ref 6.5–8.1)

## 2018-07-22 LAB — URINALYSIS, MICROSCOPIC (REFLEX): RBC / HPF: NONE SEEN RBC/hpf (ref 0–5)

## 2018-07-22 LAB — URINALYSIS, ROUTINE W REFLEX MICROSCOPIC
BILIRUBIN URINE: NEGATIVE
Glucose, UA: NEGATIVE mg/dL
HGB URINE DIPSTICK: NEGATIVE
Ketones, ur: NEGATIVE mg/dL
NITRITE: NEGATIVE
PROTEIN: NEGATIVE mg/dL
Specific Gravity, Urine: <1.005 — ABNORMAL LOW (ref 1.005–1.030)
pH: 6.5 (ref 5.0–8.0)

## 2018-07-22 LAB — PREGNANCY, URINE: PREG TEST UR: NEGATIVE

## 2018-07-22 LAB — LIPASE, BLOOD: Lipase: 25 U/L (ref 11–51)

## 2018-07-22 MED ORDER — ONDANSETRON HCL 4 MG/2ML IJ SOLN
INTRAMUSCULAR | Status: AC
  Filled 2018-07-22: qty 2

## 2018-07-22 MED ORDER — ROCURONIUM BROMIDE 10 MG/ML (PF) SYRINGE
PREFILLED_SYRINGE | INTRAVENOUS | Status: AC
  Filled 2018-07-22: qty 10

## 2018-07-22 MED ORDER — SUGAMMADEX SODIUM 200 MG/2ML IV SOLN
INTRAVENOUS | Status: AC
  Filled 2018-07-22: qty 2

## 2018-07-22 MED ORDER — PROPOFOL 10 MG/ML IV BOLUS
INTRAVENOUS | Status: AC
  Filled 2018-07-22: qty 20

## 2018-07-22 MED ORDER — MORPHINE SULFATE (PF) 4 MG/ML IV SOLN
4.0000 mg | Freq: Once | INTRAVENOUS | Status: AC
  Administered 2018-07-22: 4 mg via INTRAVENOUS
  Filled 2018-07-22: qty 1

## 2018-07-22 MED ORDER — METRONIDAZOLE IN NACL 5-0.79 MG/ML-% IV SOLN: 500.0000 mg | Freq: Once | INTRAVENOUS | Status: DC

## 2018-07-22 MED ORDER — MIDAZOLAM HCL 2 MG/2ML IJ SOLN
INTRAMUSCULAR | Status: AC
  Filled 2018-07-22: qty 2

## 2018-07-22 MED ORDER — IOPAMIDOL (ISOVUE-300) INJECTION 61%
100.0000 mL | Freq: Once | INTRAVENOUS | Status: AC | PRN
  Administered 2018-07-22: 100 mL via INTRAVENOUS

## 2018-07-22 MED ORDER — SODIUM CHLORIDE 0.9 % IV SOLN
2.0000 g | Freq: Once | INTRAVENOUS | Status: AC
  Administered 2018-07-22: 2 g via INTRAVENOUS
  Filled 2018-07-22: qty 20

## 2018-07-22 MED ORDER — FENTANYL CITRATE (PF) 250 MCG/5ML IJ SOLN
INTRAMUSCULAR | Status: AC
  Filled 2018-07-22: qty 5

## 2018-07-22 MED ORDER — DEXAMETHASONE SODIUM PHOSPHATE 10 MG/ML IJ SOLN
INTRAMUSCULAR | Status: AC
  Filled 2018-07-22: qty 1

## 2018-07-22 MED ORDER — SUCCINYLCHOLINE CHLORIDE 200 MG/10ML IV SOSY
PREFILLED_SYRINGE | INTRAVENOUS | Status: AC
  Filled 2018-07-22: qty 10

## 2018-07-22 MED ORDER — LIDOCAINE 2% (20 MG/ML) 5 ML SYRINGE
INTRAMUSCULAR | Status: AC
  Filled 2018-07-22: qty 5

## 2018-07-22 NOTE — ED Notes (Signed)
Report to [facility].  

## 2018-07-22 NOTE — ED Notes (Signed)
ED Provider at bedside. 

## 2018-07-22 NOTE — ED Notes (Signed)
Pt drinking oral contrast. Wait time is 2 hours.

## 2018-07-22 NOTE — ED Notes (Signed)
PT resting calmly on stretcher texting on phone. No acute pain observed. Pt c/o vaginal discharge.

## 2018-07-22 NOTE — ED Provider Notes (Signed)
[facility] EMERGENCY DEPARTMENT Provider Note   CSN: [id number] Arrival date & time: 07/22/18  9379     History   Chief Complaint Chief Complaint  Patient presents with  . Abdominal Pain    HPI Molly Savarino is a 27 y.o. female presenting for evaluation of abdominal pain.  Patient states pain woke her up from sleep at 8:00 this morning.  She reports generalized abdominal pressure.  After having a small bowel movement, she reports pressure felt like it broke apart, and localized in her lower abdomen.  Pain is mostly in the right lower quadrant, radiating directly to her back.  It is worse with movement.  She reports decreased appetite due to pain.  She has not taken anything for pain including Tylenol or ibuprofen.  She denies fevers, chills, chest pain, shortness of breath, nausea, vomiting, urinary symptoms.  Patient states she has been having a vaginal discharge for the past several days, but she recently changed soaps and thinks this is the cause.  She denies vaginal irritation.  She was sexually active with one female partner.  She is not currently on birth control not use condoms.  Patient states she has no medical problems no takes medications daily.  She denies history of abdominal surgeries.  HPI  Past Medical History:  Diagnosis Date  . Vaginal delivery     Patient Active Problem List   Diagnosis Date Noted  . 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 WISDOM TOOTH EXTRACTION       OB History    Gravida  2   Para  1   Term  1   Preterm      AB  1   Living  1     SAB      TAB  1   Ectopic      Multiple  0   Live Births  1            Home Medications    Prior to Admission medications   Medication Sig Start Date End Date Taking? Authorizing Provider  fluconazole (DIFLUCAN) 150 MG tablet Take 1 tablet (150 mg total) by mouth daily. 04/03/18   Jorje Guild, NP  metroNIDAZOLE  (FLAGYL) 500 MG tablet Take 1 tablet (500 mg total) by mouth 2 (two) times daily. 03/22/18   Donnamae Jude, MD    Family History Family History  Problem Relation Age of Onset  . Asthma Neg Hx   . Diabetes Neg Hx   . Heart disease Neg Hx   . Hypertension Neg Hx   . Kidney disease Neg Hx   . Stroke Neg Hx     Social History Social History   Tobacco Use  . Smoking status: Former Smoker    Packs/day: 0.20    Types: Cigars    Last attempt to quit: 12/05/2015    Years since quitting: 2.6  . Smokeless tobacco: Never Used  Substance Use Topics  . Alcohol use: No  . Drug use: No     Allergies   Patient has no known allergies.   Review of Systems Review of Systems  Constitutional: Positive for appetite change.  Gastrointestinal: Positive for abdominal pain.  All other systems reviewed and are negative.    Physical Exam Updated Vital Signs BP 100/62 (BP Location: Left Arm)   Pulse 68   Temp 98 F (36.7 C) (Oral)   Resp 16   Ht 5\' 6"  (  1.676 m)   Wt 54.9 kg   LMP 07/08/2018   SpO2 100%   BMI 19.54 kg/m   Physical Exam Vitals signs and nursing note reviewed.  Constitutional:      General: She is not in acute distress.    Appearance: She is well-developed.     Comments: Patient appears uncomfortable due to pain, but in no acute distress  HENT:     Head: Normocephalic and atraumatic.  Eyes:     Conjunctiva/sclera: Conjunctivae normal.     Pupils: Pupils are equal, round, and reactive to light.  Neck:     Musculoskeletal: Normal range of motion and neck supple.  Cardiovascular:     Rate and Rhythm: Normal rate and regular rhythm.  Pulmonary:     Effort: Pulmonary effort is normal. No respiratory distress.     Breath sounds: Normal breath sounds. No wheezing.  Abdominal:     General: Bowel sounds are normal. There is no distension.     Palpations: Abdomen is soft.     Tenderness: There is abdominal tenderness in the right lower quadrant. There is guarding.  There is no rebound. Positive signs include McBurney's sign.     Comments: Tenderness palpation of right lower quadrant with guarding.  Pain at McBurney's point.  Negative rebound.  Minimal to no tenderness palpation elsewhere in the abdomen.  Musculoskeletal: Normal range of motion.  Skin:    General: Skin is warm and dry.     Capillary Refill: Capillary refill takes less than 2 seconds.  Neurological:     Mental Status: She is alert and oriented to person, place, and time.      ED Treatments / Results  Labs (all labs ordered are listed, but only abnormal results are displayed) Labs Reviewed  URINALYSIS, ROUTINE W REFLEX MICROSCOPIC - Abnormal; Notable for the following components:      Result Value   Specific Gravity, Urine <1.005 (*)    Leukocytes, UA TRACE (*)    All other components within normal limits  CBC WITH DIFFERENTIAL/PLATELET - Abnormal; Notable for the following components:   WBC 12.8 (*)    Neutro Abs 10.0 (*)    All other components within normal limits  COMPREHENSIVE METABOLIC PANEL - Abnormal; Notable for the following components:   Potassium 3.2 (*)    All other components within normal limits  URINALYSIS, MICROSCOPIC (REFLEX) - Abnormal; Notable for the following components:   Bacteria, UA RARE (*)    All other components within normal limits  PREGNANCY, URINE  LIPASE, BLOOD    EKG None  Radiology Ct Abdomen Pelvis W Contrast  Result Date: 07/22/2018 CLINICAL DATA:  Abdominal pain, nausea and vomiting. Clinical concern for appendicitis. EXAM: CT ABDOMEN AND PELVIS WITH CONTRAST TECHNIQUE: Multidetector CT imaging of the abdomen and pelvis was performed using the standard protocol following bolus administration of intravenous contrast. CONTRAST:  158mL ISOVUE-300 IOPAMIDOL (ISOVUE-300) INJECTION 61% COMPARISON:  None. FINDINGS: Lower chest: Lung bases are clear. Hepatobiliary: No focal liver abnormality is seen. No gallstones, gallbladder wall thickening,  or biliary dilatation. Pancreas: No ductal dilatation or inflammation. Spleen: Normal in size without focal abnormality. Adrenals/Urinary Tract: Normal adrenal glands. No hydronephrosis or perinephric edema. Homogeneous renal enhancement. Urinary bladder is physiologically distended without wall thickening. Stomach/Bowel: Low-density in the ascending colon suspicious for colonic wall thickening, as well as liquid stool, wall thickening best appreciated on coronal and sagittal reformats, for example image 36 series 5 and image 25 series 6. Mild pericolonic fat  stranding in the pericolic gutter. Fluid levels in the transverse colon likely liquid stool rather than additional colonic wall thickening. The appendix is mildly dilated measuring 8 mm image 30 series 5. Mild appendiceal wall thickening but no intraluminal fluid, appendicoliths, or periappendiceal inflammation. No small bowel dilatation or obstruction, administered enteric contrast reaches the ascending colon. Stomach is physiologically distended. Vascular/Lymphatic: Abdominal aorta is normal in caliber. Portal and splenic veins and mesenteric vessels are patent. Mild prominence of the gonadal veins and increased adnexal vascularity. Reproductive: Mild increased bilateral adnexal and periuterine vascularity. No adnexal mass. Other: Minimal free fluid in the pelvis may be physiologic or reactive. No free fluid elsewhere. No free air. No intra-abdominal abscess. Musculoskeletal: There are no acute or suspicious osseous abnormalities. IMPRESSION: 1. Low-density in the ascending colon is likely combination of colonic wall thickening and liquid stool. Mild pericolonic edema in the right pericolic gutter. Findings suspicious for ascending colitis. Differential diagnosis include infectious or inflammatory etiologies. 2. Mildly enlarged appendix at 8 mm with wall thickening but no intraluminal fluid, appendicolith, or periappendiceal inflammation. Imaging findings are  equivocal for acute appendicitis. Electronically Signed   By: Keith Rake M.D.   On: 07/22/2018 22:07    Procedures Procedures (including critical care time)  Medications Ordered in ED Medications  cefTRIAXone (ROCEPHIN) 2 g in sodium chloride 0.9 % 100 mL IVPB ( Intravenous Stopped 07/22/18 2322)    And  metroNIDAZOLE (FLAGYL) IVPB 500 mg (has no administration in time range)  iopamidol (ISOVUE-300) 61 % injection 100 mL (100 mLs Intravenous Contrast Given 07/22/18 2132)  morphine 4 MG/ML injection 4 mg (4 mg Intravenous Given 07/22/18 2326)     Initial Impression / Assessment and Plan / ED Course  I have reviewed the triage vital signs and the nursing notes.  Pertinent labs & imaging results that were available during my care of the patient were reviewed by me and considered in my medical decision making (see chart for details).     Pt presenting for evaluation of abdominal pain.  Physical exam concerning, patient with guarding and tenderness of the right lower quadrant.  Concern for appendicitis.  Will obtain labs, urine, and CT for further evaluation.  With elevated white count at 13.  UA without obvious infection, shows trace leuks and rare bacteria.  As she does not have dysuria, low suspicion for UTI.  CMP reassuring, electrolytes stable.  Pregnancy negative.  CT pending.  CT with possible colitis, but also showing enlargement of the appendix.  Exam is equivocal for acute appendicitis.  Considering patient's focal right lower quadrant tenderness and elevated white count, concerned about early appendicitis.  Will consult with surgery for further evaluation.  Discussed with Dr. Lucia Gaskins from [place], who recommends transfer to [facility] for further evaluation and management.  Final Clinical Impressions(s) / ED Diagnoses   Final diagnoses:  Acute appendicitis, unspecified acute appendicitis type  Colitis    ED Discharge Orders    None       Franchot Heidelberg,  PA-C 07/22/18 2331    Margette Fast, MD 07/23/18 1615

## 2018-07-22 NOTE — H&P (Signed)
Re:   Tricia Campbell DOB:   16-Apr-1991 MRN:   [id number]  Chief Complaint Abdominal pain  ASSESEMENT AND PLAN: 1.  Appendicitis  I told her the CT scan is soft, but her symptoms go well with appendicitis.  I discussed the non operative option of antibiotics and repeat exams.   I discussed with the patient the indications and risks of appendiceal surgery.  The primary risks of appendiceal surgery include, but are not limited to, bleeding, infection, bowel surgery, and open surgery.  There is also the risk that the patient may have continued symptoms after surgery.  We discussed the typical post-operative recovery course. I tried to answer the patient's questions.   Chief Complaint  Patient presents with  . Abdominal Pain   PHYSICIAN REQUESTING CONSULTATION:  Franchot Heidelberg, PA, [facility]  HISTORY OF PRESENT ILLNESS: Tricia Campbell is a 27 y.o. (DOB: May 21, 1991)  AA female whose primary care physician is Patient, No Pcp Per.   She awoke out of sleep with lower abdominal pain this AM.  She took some epson salts, vomited once.  She felt better for a while, but then started feeling worse and went to [place].  She has no GI history, no ulcers, no diarrhea, and no chronic abdominal complaints.  She's had no abdominal surgery.  She had her only son vaginally in June 2018.  CT scan - 07/22/2018 - 1. Low-density in the ascending colon is likely combination of colonic wall thickening and liquid stool. Mild pericolonic edema in the right pericolic gutter. Findings suspicious for ascending colitis. Differential diagnosis include infectious or inflammatory etiologies.  2. Mildly enlarged appendix at 8 mm with wall thickening but no intraluminal fluid, appendicolith, or periappendiceal inflammation. Imaging findings are equivocal for acute appendicitis. WBC - 12,800 - 07/22/2018    Past Medical History:  Diagnosis Date  . Vaginal delivery        Past Surgical History:  Procedure Laterality Date  . WISDOM TOOTH EXTRACTION        Current Facility-Administered Medications  Medication Dose Route Frequency Provider Last Rate Last Dose  . cefTRIAXone (ROCEPHIN) 2 g in sodium chloride 0.9 % 100 mL IVPB  2 g Intravenous Once Caccavale, Sophia, PA-C 200 mL/hr at 07/22/18 2255 2 g at 07/22/18 2255   And  . metroNIDAZOLE (FLAGYL) IVPB 500 mg  500 mg Intravenous Once Caccavale, Sophia, PA-C      . morphine 4 MG/ML injection 4 mg  4 mg Intravenous Once Caccavale, Sophia, PA-C       Current Outpatient Medications  Medication Sig Dispense Refill  . fluconazole (DIFLUCAN) 150 MG tablet Take 1 tablet (150 mg total) by mouth daily. 1 tablet 1  . metroNIDAZOLE (FLAGYL) 500 MG tablet Take 1 tablet (500 mg total) by mouth 2 (two) times daily. 14 tablet 0     No Known Allergies  REVIEW OF SYSTEMS: Skin:  No history of rash.  No history of abnormal moles. Infection:  No history of hepatitis or HIV.  No history of MRSA. Neurologic:  No history of stroke.  No history of seizure.  No history of headaches. Cardiac:  No history of hypertension. No history of heart disease.  No history of seeing a cardiologist. Pulmonary:  Does not smoke cigarettes.  No asthma or bronchitis.  No OSA/CPAP.  Endocrine:  No diabetes. No thyroid disease. Gastrointestinal:  See HPI Urologic:  No history of kidney stones.  No history of bladder infections. Musculoskeletal:  No history of joint or back disease. Hematologic:  No bleeding disorder.  No history of anemia.  Not anticoagulated. Psycho-social:  The patient is oriented.   The patient has no obvious psychologic or social impairment to understanding our conversation and plan.  SOCIAL and FAMILY HISTORY: Unmarried. 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  Her grandmother, Howell Rucks, is on the way.  No family history of  appendicitis.  PHYSICAL EXAM: BP 100/62 (BP Location: Left Arm)   Pulse 68   Temp 98 F (36.7 C) (Oral)   Resp 16   Ht 5\' 6"  (1.676 m)   Wt 54.9 kg   LMP 07/08/2018   SpO2 100%   BMI 19.54 kg/m   General: AA F who is alert and generally healthy appearing.   She has a lot of hair (or braids). Skin:  Inspection and palpation - no mass or rash. Eyes:  Conjunctiva and lids unremarkable.            Pupils are equal Ears, Nose, Mouth, and Throat:  Ears and nose unremarkable            Lips and teeth are unremarable. Neck: Supple. No mass, trachea midline.  No thyroid mass. Lymph Nodes:  No supraclavicular, cervical, or inguinal nodes. Lungs: Normal respiratory effort.  Clear to auscultation and symmetric breath sounds. Heart:  Palpation of the heart is normal.            Auscultation: RRR. No murmur or rub.  Abdomen: Soft. No mass. No tenderness. No abdominal scars.  She is tender with some mild guarding in the RLQ. Rectal: Not done. Musculoskeletal:  Good muscle strength and ROM  in upper and lower extremities.  Neurologic:  Grossly intact to motor and sensory function. Psychiatric: Normal judgement and insight. Behavior is normal.            Oriented to time, person, place.   DATA REVIEWED, COUNSELING AND COORDINATION OF CARE: Epic notes reviewed. Counseling and coordination of care exceeded more than 50% of the time spent with patient. Total time spent with patient and charting: 45 minutes  Alphonsa Overall, MD,  [facility], [address]  [place], [place]    [place] Phone:  [phone] FAX:  [phone]

## 2018-07-22 NOTE — ED Notes (Signed)
[facility] notified Baxter Flattery) - patient ready for transport to PACU @ [place] [facility] Alphonsa Overall accepting

## 2018-07-22 NOTE — ED Notes (Signed)
Patient transported to CT 

## 2018-07-22 NOTE — ED Triage Notes (Signed)
Abdominal pain since this am. She has a vaginal discharge.

## 2018-07-23 ENCOUNTER — Encounter (HOSPITAL_COMMUNITY)
Admission: EM | Disposition: A | Discharge status: Home / Self Care | Payer: Self-pay | Source: Home / Self Care | Attending: Emergency Medicine

## 2018-07-23 ENCOUNTER — Emergency Department (HOSPITAL_COMMUNITY): Payer: Self-pay | Admitting: Certified Registered"

## 2018-07-23 ENCOUNTER — Other Ambulatory Visit: Payer: Self-pay

## 2018-07-23 DIAGNOSIS — K37 Unspecified appendicitis: Secondary | ICD-10-CM | POA: Diagnosis present

## 2018-07-23 HISTORY — PX: LAPAROSCOPIC APPENDECTOMY: SHX408

## 2018-07-23 SURGERY — APPENDECTOMY, LAPAROSCOPIC
Anesthesia: General | Site: Abdomen

## 2018-07-23 MED ORDER — ONDANSETRON HCL 4 MG/2ML IJ SOLN: 4.0000 mg | Freq: Four times a day (QID) | INTRAMUSCULAR | Status: DC | PRN

## 2018-07-23 MED ORDER — HYDROCODONE-ACETAMINOPHEN 5-325 MG PO TABS
1.0000 | ORAL_TABLET | ORAL | Status: DC | PRN
  Administered 2018-07-23: 1 via ORAL
  Filled 2018-07-23: qty 1

## 2018-07-23 MED ORDER — LACTATED RINGERS IV SOLN
INTRAVENOUS | Status: DC
  Administered 2018-07-23: via INTRAVENOUS

## 2018-07-23 MED ORDER — TRAMADOL HCL 50 MG PO TABS: 50.0000 mg | ORAL_TABLET | Freq: Four times a day (QID) | ORAL | Status: DC | PRN

## 2018-07-23 MED ORDER — FENTANYL CITRATE (PF) 100 MCG/2ML IJ SOLN
25.0000 ug | INTRAMUSCULAR | Status: DC | PRN
  Administered 2018-07-23 (×2): 25 ug via INTRAVENOUS

## 2018-07-23 MED ORDER — METRONIDAZOLE IN NACL 5-0.79 MG/ML-% IV SOLN
500.0000 mg | Freq: Three times a day (TID) | INTRAVENOUS | Status: DC
  Administered 2018-07-23: 500 mg via INTRAVENOUS
  Filled 2018-07-23: qty 100

## 2018-07-23 MED ORDER — TRAMADOL HCL 50 MG PO TABS: 50.0000 mg | ORAL_TABLET | Freq: Four times a day (QID) | ORAL | 0 refills | Status: DC | PRN

## 2018-07-23 MED ORDER — IBUPROFEN 400 MG PO TABS: 600.0000 mg | ORAL_TABLET | Freq: Four times a day (QID) | ORAL | Status: DC | PRN

## 2018-07-23 MED ORDER — ROCURONIUM BROMIDE 10 MG/ML (PF) SYRINGE
PREFILLED_SYRINGE | INTRAVENOUS | Status: DC | PRN
  Administered 2018-07-23: 30 mg via INTRAVENOUS

## 2018-07-23 MED ORDER — ONDANSETRON 4 MG PO TBDP: 4.0000 mg | ORAL_TABLET | Freq: Four times a day (QID) | ORAL | Status: DC | PRN

## 2018-07-23 MED ORDER — BUPIVACAINE-EPINEPHRINE (PF) 0.25% -1:200000 IJ SOLN
INTRAMUSCULAR | Status: AC
  Filled 2018-07-23: qty 30

## 2018-07-23 MED ORDER — DEXAMETHASONE SODIUM PHOSPHATE 10 MG/ML IJ SOLN
INTRAMUSCULAR | Status: DC | PRN
  Administered 2018-07-23: 10 mg via INTRAVENOUS

## 2018-07-23 MED ORDER — IBUPROFEN 600 MG PO TABS: 600.0000 mg | ORAL_TABLET | Freq: Four times a day (QID) | ORAL | 0 refills | Status: DC | PRN

## 2018-07-23 MED ORDER — METRONIDAZOLE IN NACL 5-0.79 MG/ML-% IV SOLN
500.0000 mg | Freq: Once | INTRAVENOUS | Status: AC
  Administered 2018-07-23: 500 mg via INTRAVENOUS
  Filled 2018-07-23: qty 100

## 2018-07-23 MED ORDER — SODIUM CHLORIDE 0.9 % IV SOLN
2.0000 g | INTRAVENOUS | Status: DC
  Filled 2018-07-23: qty 20

## 2018-07-23 MED ORDER — FENTANYL CITRATE (PF) 100 MCG/2ML IJ SOLN
INTRAMUSCULAR | Status: AC
  Filled 2018-07-23: qty 2

## 2018-07-23 MED ORDER — MIDAZOLAM HCL 5 MG/5ML IJ SOLN
INTRAMUSCULAR | Status: DC | PRN
  Administered 2018-07-23: 2 mg via INTRAVENOUS

## 2018-07-23 MED ORDER — PROPOFOL 10 MG/ML IV BOLUS
INTRAVENOUS | Status: DC | PRN
  Administered 2018-07-23: 180 mg via INTRAVENOUS

## 2018-07-23 MED ORDER — MEPERIDINE HCL 50 MG/ML IJ SOLN: 6.2500 mg | INTRAMUSCULAR | Status: DC | PRN

## 2018-07-23 MED ORDER — LACTATED RINGERS IV SOLN
INTRAVENOUS | Status: AC | PRN
  Administered 2018-07-23: 1000 mL

## 2018-07-23 MED ORDER — ACETAMINOPHEN 325 MG PO TABS: 650.0000 mg | ORAL_TABLET | ORAL | Status: AC | PRN

## 2018-07-23 MED ORDER — BUPIVACAINE-EPINEPHRINE 0.25% -1:200000 IJ SOLN
INTRAMUSCULAR | Status: DC | PRN
  Administered 2018-07-23: 30 mL

## 2018-07-23 MED ORDER — LIDOCAINE 2% (20 MG/ML) 5 ML SYRINGE
INTRAMUSCULAR | Status: DC | PRN
  Administered 2018-07-23: 100 mg via INTRAVENOUS

## 2018-07-23 MED ORDER — METOCLOPRAMIDE HCL 5 MG/ML IJ SOLN: 10.0000 mg | Freq: Once | INTRAMUSCULAR | Status: DC | PRN

## 2018-07-23 MED ORDER — MORPHINE SULFATE (PF) 2 MG/ML IV SOLN: 1.0000 mg | INTRAVENOUS | Status: DC | PRN

## 2018-07-23 MED ORDER — SUGAMMADEX SODIUM 200 MG/2ML IV SOLN
INTRAVENOUS | Status: DC | PRN
  Administered 2018-07-23: 150 mg via INTRAVENOUS

## 2018-07-23 MED ORDER — ONDANSETRON HCL 4 MG/2ML IJ SOLN
INTRAMUSCULAR | Status: DC | PRN
  Administered 2018-07-23: 4 mg via INTRAVENOUS

## 2018-07-23 MED ORDER — FENTANYL CITRATE (PF) 100 MCG/2ML IJ SOLN
INTRAMUSCULAR | Status: DC | PRN
  Administered 2018-07-23: 100 ug via INTRAVENOUS
  Administered 2018-07-23: 50 ug via INTRAVENOUS
  Administered 2018-07-23: 100 ug via INTRAVENOUS

## 2018-07-23 MED ORDER — KCL IN DEXTROSE-NACL 20-5-0.45 MEQ/L-%-% IV SOLN
INTRAVENOUS | Status: DC
  Administered 2018-07-23: 04:00:00 via INTRAVENOUS
  Filled 2018-07-23: qty 1000

## 2018-07-23 MED ORDER — 0.9 % SODIUM CHLORIDE (POUR BTL) OPTIME
TOPICAL | Status: DC | PRN
  Administered 2018-07-23: 1000 mL

## 2018-07-23 MED ORDER — SUCCINYLCHOLINE CHLORIDE 200 MG/10ML IV SOSY
PREFILLED_SYRINGE | INTRAVENOUS | Status: DC | PRN
  Administered 2018-07-23: 110 mg via INTRAVENOUS

## 2018-07-23 SURGICAL SUPPLY — 34 items
APPLIER CLIP ROT 10 11.4 M/L (STAPLE)
BENZOIN TINCTURE PRP APPL 2/3 (GAUZE/BANDAGES/DRESSINGS) IMPLANT
CABLE HIGH FREQUENCY MONO STRZ (ELECTRODE) ×3 IMPLANT
CHLORAPREP W/TINT 26ML (MISCELLANEOUS) ×3 IMPLANT
CLIP APPLIE ROT 10 11.4 M/L (STAPLE) IMPLANT
CLOSURE WOUND 1/2 X4 (GAUZE/BANDAGES/DRESSINGS)
COVER SURGICAL LIGHT HANDLE (MISCELLANEOUS) ×3 IMPLANT
COVER WAND RF STERILE (DRAPES) IMPLANT
CUTTER FLEX LINEAR 45M (STAPLE) ×3 IMPLANT
DECANTER SPIKE VIAL GLASS SM (MISCELLANEOUS) ×3 IMPLANT
DERMABOND ADVANCED (GAUZE/BANDAGES/DRESSINGS) ×2
DERMABOND ADVANCED .7 DNX12 (GAUZE/BANDAGES/DRESSINGS) ×1 IMPLANT
DRAPE LAPAROSCOPIC ABDOMINAL (DRAPES) ×3 IMPLANT
ELECT REM PT RETURN 15FT ADLT (MISCELLANEOUS) ×3 IMPLANT
ENDOLOOP SUT PDS II  0 18 (SUTURE)
ENDOLOOP SUT PDS II 0 18 (SUTURE) IMPLANT
GLOVE SURG SIGNA 7.5 PF LTX (GLOVE) ×3 IMPLANT
GOWN STRL REUS W/TWL XL LVL3 (GOWN DISPOSABLE) ×6 IMPLANT
KIT BASIN OR (CUSTOM PROCEDURE TRAY) ×3 IMPLANT
POUCH SPECIMEN RETRIEVAL 10MM (ENDOMECHANICALS) IMPLANT
RELOAD 45 VASCULAR/THIN (ENDOMECHANICALS) IMPLANT
RELOAD STAPLE TA45 3.5 REG BLU (ENDOMECHANICALS) ×3 IMPLANT
SCISSORS LAP 5X35 DISP (ENDOMECHANICALS) ×3 IMPLANT
SET IRRIG TUBING LAPAROSCOPIC (IRRIGATION / IRRIGATOR) ×3 IMPLANT
SHEARS HARMONIC ACE PLUS 36CM (ENDOMECHANICALS) ×3 IMPLANT
SLEEVE XCEL OPT CAN 5 100 (ENDOMECHANICALS) ×3 IMPLANT
STRIP CLOSURE SKIN 1/2X4 (GAUZE/BANDAGES/DRESSINGS) IMPLANT
SUT MNCRL AB 4-0 PS2 18 (SUTURE) ×3 IMPLANT
SUT VIC AB 2-0 SH 18 (SUTURE) IMPLANT
TOWEL OR 17X26 10 PK STRL BLUE (TOWEL DISPOSABLE) ×3 IMPLANT
TOWEL OR NON WOVEN STRL DISP B (DISPOSABLE) ×3 IMPLANT
TRAY LAPAROSCOPIC (CUSTOM PROCEDURE TRAY) ×3 IMPLANT
TROCAR BLADELESS OPT 5 100 (ENDOMECHANICALS) ×3 IMPLANT
TROCAR XCEL BLUNT TIP 100MML (ENDOMECHANICALS) ×3 IMPLANT

## 2018-07-23 NOTE — Anesthesia Preprocedure Evaluation (Signed)
Anesthesia Evaluation  Patient identified by MRN, date of birth, ID band Patient awake    Reviewed: Allergy & Precautions, H&P , NPO status , Patient's Chart, lab work & pertinent test results, reviewed documented beta blocker date and time   Airway Mallampati: II  TM Distance: >3 FB Neck ROM: full    Dental no notable dental hx.    Pulmonary former smoker,    Pulmonary exam normal breath sounds clear to auscultation       Cardiovascular Exercise Tolerance: Good negative cardio ROS   Rhythm:regular Rate:Normal     Neuro/Psych negative neurological ROS  negative psych ROS   GI/Hepatic negative GI ROS, Neg liver ROS,   Endo/Other  negative endocrine ROS  Renal/GU negative Renal ROS  negative genitourinary   Musculoskeletal   Abdominal   Peds  Hematology negative hematology ROS (+)   Anesthesia Other Findings   Reproductive/Obstetrics negative OB ROS                             Anesthesia Physical Anesthesia Plan  ASA: II and emergent  Anesthesia Plan: General   Post-op Pain Management:    Induction: Rapid sequence, Cricoid pressure planned and Intravenous  PONV Risk Score and Plan: 4 or greater and Ondansetron, Dexamethasone, Midazolam and Scopolamine patch - Pre-op  Airway Management Planned: Oral ETT  Additional Equipment:   Intra-op Plan:   Post-operative Plan:   Informed Consent: I have reviewed the patients History and Physical, chart, labs and discussed the procedure including the risks, benefits and alternatives for the proposed anesthesia with the patient or authorized representative who has indicated his/her understanding and acceptance.   Dental Advisory Given and Dental advisory given  Plan Discussed with: CRNA  Anesthesia Plan Comments:         Anesthesia Quick Evaluation

## 2018-07-23 NOTE — Anesthesia Procedure Notes (Signed)
Procedure Name: Intubation Date/Time: 07/23/2018 12:36 AM Performed by: Bret Stamour D, CRNA Pre-anesthesia Checklist: Patient identified, Emergency Drugs available, Suction available and Patient being monitored Patient Re-evaluated:Patient Re-evaluated prior to induction Oxygen Delivery Method: Circle system utilized Preoxygenation: Pre-oxygenation with 100% oxygen Induction Type: IV induction, Rapid sequence and Cricoid Pressure applied Laryngoscope Size: Mac and 3 Tube type: Oral Tube size: 7.5 mm Number of attempts: 1 Airway Equipment and Method: Stylet Placement Confirmation: ETT inserted through vocal cords under direct vision,  positive ETCO2 and breath sounds checked- equal and bilateral Tube secured with: Tape Dental Injury: Teeth and Oropharynx as per pre-operative assessment

## 2018-07-23 NOTE — Anesthesia Postprocedure Evaluation (Signed)
Anesthesia Post Note  Patient: Tricia Campbell  Procedure(s) Performed: APPENDECTOMY LAPAROSCOPIC (N/A Abdomen)     Patient location during evaluation: PACU Anesthesia Type: General Level of consciousness: awake and alert Pain management: pain level controlled Vital Signs Assessment: post-procedure vital signs reviewed and stable Respiratory status: spontaneous breathing, nonlabored ventilation, respiratory function stable and patient connected to nasal cannula oxygen Cardiovascular status: blood pressure returned to baseline and stable Postop Assessment: no apparent nausea or vomiting Anesthetic complications: no    Last Vitals:  Vitals:   07/23/18 0145 07/23/18 0200  BP: 118/66 126/84  Pulse: 77 70  Resp: (!) 21 13  Temp:    SpO2: 100% 99%    Last Pain:  Vitals:   07/23/18 0130  TempSrc:   PainSc: 0-No pain                 Montez Hageman

## 2018-07-23 NOTE — Discharge Summary (Signed)
[facility] Discharge Summary   Patient ID: Tricia Campbell MRN: [id number] DOB/AGE: [birth year] 27 y.o.  Admit date: 07/22/2018 Discharge date: 07/23/2018  Admitting Diagnosis: Acute appendicitis  Discharge Diagnosis Patient Active Problem List   Diagnosis Date Noted  . Appendicitis 07/23/2018  . Marijuana use 08/17/2016    Consultants None  Imaging: Ct Abdomen Pelvis W Contrast  Result Date: 07/22/2018 CLINICAL DATA:  Abdominal pain, nausea and vomiting. Clinical concern for appendicitis. EXAM: CT ABDOMEN AND PELVIS WITH CONTRAST TECHNIQUE: Multidetector CT imaging of the abdomen and pelvis was performed using the standard protocol following bolus administration of intravenous contrast. CONTRAST:  155mL ISOVUE-300 IOPAMIDOL (ISOVUE-300) INJECTION 61% COMPARISON:  None. FINDINGS: Lower chest: Lung bases are clear. Hepatobiliary: No focal liver abnormality is seen. No gallstones, gallbladder wall thickening, or biliary dilatation. Pancreas: No ductal dilatation or inflammation. Spleen: Normal in size without focal abnormality. Adrenals/Urinary Tract: Normal adrenal glands. No hydronephrosis or perinephric edema. Homogeneous renal enhancement. Urinary bladder is physiologically distended without wall thickening. Stomach/Bowel: Low-density in the ascending colon suspicious for colonic wall thickening, as well as liquid stool, wall thickening best appreciated on coronal and sagittal reformats, for example image 36 series 5 and image 25 series 6. Mild pericolonic fat stranding in the pericolic gutter. Fluid levels in the transverse colon likely liquid stool rather than additional colonic wall thickening. The appendix is mildly dilated measuring 8 mm image 30 series 5. Mild appendiceal wall thickening but no intraluminal fluid, appendicoliths, or periappendiceal inflammation. No small bowel dilatation or obstruction, administered enteric contrast reaches the ascending colon.  Stomach is physiologically distended. Vascular/Lymphatic: Abdominal aorta is normal in caliber. Portal and splenic veins and mesenteric vessels are patent. Mild prominence of the gonadal veins and increased adnexal vascularity. Reproductive: Mild increased bilateral adnexal and periuterine vascularity. No adnexal mass. Other: Minimal free fluid in the pelvis may be physiologic or reactive. No free fluid elsewhere. No free air. No intra-abdominal abscess. Musculoskeletal: There are no acute or suspicious osseous abnormalities. IMPRESSION: 1. Low-density in the ascending colon is likely combination of colonic wall thickening and liquid stool. Mild pericolonic edema in the right pericolic gutter. Findings suspicious for ascending colitis. Differential diagnosis include infectious or inflammatory etiologies. 2. Mildly enlarged appendix at 8 mm with wall thickening but no intraluminal fluid, appendicolith, or periappendiceal inflammation. Imaging findings are equivocal for acute appendicitis. Electronically Signed   By: Keith Rake M.D.   On: 07/22/2018 22:07    Procedures Dr. Lucia Gaskins (07/23/18) - Laparoscopic Appendectomy  Hospital Course:  Patient is a 27 year old female who presented to [facility] with abdominal pain.  Workup showed acute appendicitis.  Patient was admitted and underwent procedure listed above.  Tolerated procedure well and was transferred to the floor.  Diet was advanced as tolerated.  On POD#1, the patient was voiding well, tolerating diet, ambulating well, pain well controlled, vital signs stable, incisions c/d/i and felt stable for discharge home.  Patient will follow up in our office in 2 weeks and knows to call with questions or concerns. She will call to confirm appointment date/time.    Physical Exam: General:  Alert, NAD, pleasant, comfortable Abd:  Soft, ND, mild tenderness, incisions C/D/I   Allergies as of 07/23/2018   No Known Allergies     Medication List    STOP taking  these medications   fluconazole 150 MG tablet Commonly known as:  DIFLUCAN   metroNIDAZOLE 500 MG tablet Commonly known as:  FLAGYL     TAKE these  medications   acetaminophen 325 MG tablet Commonly known as:  TYLENOL Take 2 tablets (650 mg total) by mouth every 4 (four) hours as needed for mild pain.   ibuprofen 600 MG tablet Commonly known as:  ADVIL,MOTRIN Take 1 tablet (600 mg total) by mouth every 6 (six) hours as needed for mild pain (for mild pain not relieved by other medications.).   traMADol 50 MG tablet Commonly known as:  ULTRAM Take 1 tablet (50 mg total) by mouth every 6 (six) hours as needed for moderate pain.        Follow-up Information    [facility] Call.   Specialty:  General Surgery Why:  Call to confirm appointment date/time. Please arrive 30 min prior to appointment time. Bring photo ID and insurance information.  Contact information: [address] [place] [address] [phone]           Signed: Brigid Re, [facility] 07/23/2018, 8:52 AM Pager: [phone] Consults: [phone] Mon-Fri 7:00 am-4:30 pm Sat-Sun 7:00 am-11:30 am

## 2018-07-23 NOTE — Transfer of Care (Signed)
Immediate Anesthesia Transfer of Care Note  Patient: Tricia Campbell  Procedure(s) Performed: APPENDECTOMY LAPAROSCOPIC (N/A Abdomen)  Patient Location: PACU  Anesthesia Type:General  Level of Consciousness: awake, alert  and oriented  Airway & Oxygen Therapy: Patient Spontanous Breathing and Patient connected to face mask oxygen  Post-op Assessment: Report given to RN and Post -op Vital signs reviewed and stable  Post vital signs: Reviewed and stable  Last Vitals:  Vitals Value Taken Time  BP    Temp    Pulse 91 07/23/2018  1:27 AM  Resp 17 07/23/2018  1:27 AM  SpO2 100 % 07/23/2018  1:27 AM  Vitals shown include unvalidated device data.  Last Pain:  Vitals:   07/22/18 2326  TempSrc:   PainSc: 5          Complications: No apparent anesthesia complications

## 2018-07-23 NOTE — Progress Notes (Signed)
Patient walking in the hallway. Walked with RN this morning. Walked approximately 50 feet.   RN stated to patient that she needed to walk again because she was somewhat unsteady.   NT walked with patient in the hallway, and patient stated "I dont need you walking with me." Patient stated that the PA that rounded on her this morning informed her that physical therapy would be coming. Patient stated "she told me that physical therapy would come walk me."  I informed the patient that physical therapy didn't need to come see her, that she and I could walk together.   Patient talking to someone on the phone and states "Im not saying i'm racist or anything, but nothing has happened that this lady said was going to happen, I just have to do everything on my own"  She also saying that "we are going to hurt her"  RN presented to room at 1123 with discharge paperwork after patient was dressed.

## 2018-07-23 NOTE — Progress Notes (Signed)
RN reviewed discharge instructions with patient and grandmother. All questions answered.   Paperwork given. Prescriptions sent to pharmacy.  Charge RN walked with patient and grandmother down to family car.    Patient very upset at this time, shouting and verbaling threatening toward staff.   Patient stating that "nothing happening like that lady said it would, and y'all just act like you don't care the way y'all talk to me! I just had surgery.

## 2018-07-23 NOTE — Op Note (Signed)
Re:   Tricia Campbell DOB:   1991/01/20 MRN:   [id number]                   FACILITY:  [facility]  DATE OF PROCEDURE: 07/23/2018                              OPERATIVE REPORT  PREOPERATIVE DIAGNOSIS:  Appendicitis  POSTOPERATIVE DIAGNOSIS:  Early appendicitis (maybe)  PROCEDURE:  Laparoscopic appendectomy.  SURGEON:  Fenton Malling. Lucia Gaskins, MD  ASSISTANT:  No first assistant.  ANESTHESIA:  General endotracheal.  Anesthesiologist: Montez Hageman, MD CRNA: Noralyn Pick D, CRNA  ASA:  1E  ESTIMATED BLOOD LOSS:  Minimal.  DRAINS: none   SPECIMEN:   Appendix  COUNTS CORRECT:  YES  INDICATIONS FOR PROCEDURE: Tricia Campbell is a 28 y.o. (DOB: 12-28-1990) AA female whose primary care doctor is Tricia Campbell and comes to the OR for an appendectomy.   I discussed with the patient, the indications and potential complications of appendiceal surgery.  The potential complications include, but are not limited to, bleeding, open surgery, bowel resection, and the possibility of another diagnosis.  OPERATIVE NOTE:  The patient underwent a general endotracheal anesthetic as supervised by Anesthesiologist: Montez Hageman, MD CRNA: Marijo Conception, CRNA, General, in [place] room #1.  The patient was given rocephin and flagyl at the beginning of the procedure and the abdomen was prepped with ChloraPrep.   A time-out was held and surgical checklist run.  An infraumbilical incision was made with sharp dissection carried down to the abdominal cavity.  An 12 mm Hasson trocar was inserted through the infraumbilical incision and into the peritoneal cavity.  A 30 degree 5 mm laparoscope was inserted through a 12 mm Hasson trocar and the Hasson trocar secured with a 0 Vicryl suture.  I placed a 5 mm trocar in the right upper quadrant and a 5 mm torcar in left lower quadrant and did abdominal exploration.    The right and left lobes of liver unremarkable.  Stomach was unremarkable.  The  pelvic organs were unremarkable.  I got a good look at both the right and left ovary and tube and these looked normal.  I saw no other intra-abdominal abnormality.  The patient had appendicitis with the appendix located behind the cecum, folded in scar tissue.  It maybe was mildly inflamed.  There was no purulence.  The appendix was not ruptured.  The mesentery of the appendix was divided with a Harmonic scalpel.  I got to the base of the appendix.  I then used a blue load 45 mm Ethicon Endo-GIA stapler and fired this across the base of the appendix.  I delivered the appendix through the umbilical trocar.  I irrigated the abdomen with 400 cc of saline.  After irrigating the abdomen, I then removed the trocars, in turn.  The umbilical port fascia was closed with 0 Vicryl suture.   I closed the skin each site with a 4-0 Monocryl suture and painted the wounds with DermaBond.  I then injected a total of 30 mL of 0.25% Marcaine at the incisions.  Sponge and needle count were correct at the end of the case.  The patient was transferred to the recovery room in good condition.  The patient tolerated the procedure well and it depends on the patient's post op clinical course as to when the patient could be discharged.   Alphonsa Overall,  MD, [facility] Surgery Pager: [phone] Office phone:  [phone]

## 2018-07-23 NOTE — Discharge Instructions (Signed)
[facility] LAPAROSCOPIC SURGERY: POST OP INSTRUCTIONS Always review your discharge instruction sheet given to you by the facility where your surgery was performed. IF YOU HAVE DISABILITY OR FAMILY LEAVE FORMS, YOU MUST BRING THEM TO THE OFFICE FOR PROCESSING.   DO NOT GIVE THEM TO YOUR DOCTOR.  PAIN CONTROL  1. First take acetaminophen (Tylenol) AND/or ibuprofen (Advil) to control your pain after surgery.  Follow directions on package.  Taking acetaminophen (Tylenol) and/or ibuprofen (Advil) regularly after surgery will help to control your pain and lower the amount of prescription pain medication you may need.  You should not take more than 3,000 mg (3 grams) of acetaminophen (Tylenol) in 24 hours.  You should not take ibuprofen (Advil), aleve, motrin, naprosyn or other NSAIDS if you have a history of stomach ulcers or chronic kidney disease.  2. A prescription for pain medication may be given to you upon discharge.  Take your pain medication as prescribed, if you still have uncontrolled pain after taking acetaminophen (Tylenol) or ibuprofen (Advil). 3. Use ice packs to help control pain. 4. If you need a refill on your pain medication, please contact your pharmacy.  They will contact our office to request authorization. Prescriptions will not be filled after 5pm or on week-ends.  HOME MEDICATIONS 5. Take your usually prescribed medications unless otherwise directed.  DIET 6. You should follow a light diet the first few days after arrival home.  Be sure to include lots of fluids daily. Avoid fatty, fried foods.   CONSTIPATION 7. It is common to experience some constipation after surgery and if you are taking pain medication.  Increasing fluid intake and taking a stool softener (such as Colace) will usually help or prevent this problem from occurring.  A mild laxative (Milk of Magnesia or Miralax) should be taken according to package instructions if there are no bowel  movements after 48 hours.  WOUND/INCISION CARE 8. Most patients will experience some swelling and bruising in the area of the incisions.  Ice packs will help.  Swelling and bruising can take several days to resolve.  9. Unless discharge instructions indicate otherwise, follow guidelines below  a. STERI-STRIPS - you may remove your outer bandages 48 hours after surgery, and you may shower at that time.  You have steri-strips (small skin tapes) in place directly over the incision.  These strips should be left on the skin for 7-10 days.   b. DERMABOND/SKIN GLUE - you may shower in 24 hours.  The glue will flake off over the next 2-3 weeks. 10. Any sutures or staples will be removed at the office during your follow-up visit.  ACTIVITIES 11. You may resume regular (light) daily activities beginning the next day--such as daily self-care, walking, climbing stairs--gradually increasing activities as tolerated.  You may have sexual intercourse when it is comfortable.  Refrain from any heavy lifting or straining until approved by your doctor. a. You may drive when you are no longer taking prescription pain medication, you can comfortably wear a seatbelt, and you can safely maneuver your car and apply brakes.  FOLLOW-UP 12. You should see your doctor in the office for a follow-up appointment approximately 2-3 weeks after your surgery.  You should have been given your post-op/follow-up appointment when your surgery was scheduled.  If you did not receive a post-op/follow-up appointment, make sure that you call for this appointment within a day or two after you arrive home to insure a convenient appointment time.  WHEN   TO CALL YOUR DOCTOR: 1. Fever over 101.0 2. Inability to urinate 3. Continued bleeding from incision. 4. Increased pain, redness, or drainage from the incision. 5. Increasing abdominal pain  The clinic staff is available to answer your questions during regular business hours.  Please don't  hesitate to call and ask to speak to one of the nurses for clinical concerns.  If you have a medical emergency, go to the nearest emergency room or call 911.  A surgeon from [facility] is always on call at the hospital. [address], [address], [place], [place]  [address] ? [address] [address], [place], [place]   [address] [phone] ? [phone] ? FAX [phone] Web site: [email or web address]  

## 2018-07-24 ENCOUNTER — Encounter (HOSPITAL_COMMUNITY): Payer: Self-pay | Admitting: Surgery

## 2018-07-24 ENCOUNTER — Other Ambulatory Visit: Payer: Self-pay

## 2018-07-24 ENCOUNTER — Emergency Department (HOSPITAL_BASED_OUTPATIENT_CLINIC_OR_DEPARTMENT_OTHER)
Admission: EM | Admit: 2018-07-24 | Discharge: 2018-07-24 | Disposition: A | Discharge status: Home / Self Care | Payer: Self-pay | Attending: Emergency Medicine | Admitting: Emergency Medicine

## 2018-07-24 DIAGNOSIS — R52 Pain, unspecified: Secondary | ICD-10-CM | POA: Insufficient documentation

## 2018-07-24 DIAGNOSIS — G8918 Other acute postprocedural pain: Secondary | ICD-10-CM | POA: Insufficient documentation

## 2018-07-24 DIAGNOSIS — Z87891 Personal history of nicotine dependence: Secondary | ICD-10-CM | POA: Insufficient documentation

## 2018-07-24 DIAGNOSIS — J029 Acute pharyngitis, unspecified: Secondary | ICD-10-CM | POA: Insufficient documentation

## 2018-07-24 MED ORDER — LIDOCAINE VISCOUS HCL 2 % MT SOLN
15.0000 mL | Freq: Once | OROMUCOSAL | Status: AC
  Administered 2018-07-24: 15 mL via OROMUCOSAL
  Filled 2018-07-24: qty 15

## 2018-07-24 MED ORDER — ONDANSETRON 4 MG PO TBDP
4.0000 mg | ORAL_TABLET | Freq: Once | ORAL | Status: AC
  Administered 2018-07-24: 4 mg via ORAL
  Filled 2018-07-24: qty 1

## 2018-07-24 MED ORDER — TRAMADOL HCL 50 MG PO TABS
50.0000 mg | ORAL_TABLET | Freq: Once | ORAL | Status: AC
  Administered 2018-07-24: 50 mg via ORAL
  Filled 2018-07-24: qty 1

## 2018-07-24 MED ORDER — ONDANSETRON 4 MG PO TBDP: ORAL_TABLET | ORAL | 0 refills | Status: DC

## 2018-07-24 MED ORDER — IBUPROFEN 400 MG PO TABS
600.0000 mg | ORAL_TABLET | Freq: Once | ORAL | Status: AC
  Administered 2018-07-24: 600 mg via ORAL
  Filled 2018-07-24: qty 1

## 2018-07-24 MED ORDER — ACETAMINOPHEN 325 MG PO TABS
650.0000 mg | ORAL_TABLET | Freq: Once | ORAL | Status: AC
  Administered 2018-07-24: 650 mg via ORAL
  Filled 2018-07-24: qty 2

## 2018-07-24 NOTE — Discharge Instructions (Signed)
Take ibuprofen and Tylenol every 6 hours regularly for pain and use tramadol as needed for breakthrough pain, you may use Zofran for nausea and Chloraseptic throat spray or Cepacol throat lozenges to help with sore throat.  Call to confirm follow-up appointment with your surgeon.  Return to the emergency department if you develop focal worsening abdominal pain, fevers, persistent vomiting and are unable to keep down fluids or any other new or concerning symptoms.

## 2018-07-24 NOTE — ED Triage Notes (Signed)
Pt states she had appendectomy yesterday and c/o sore throat and body aches x 1 day, pt tearful in triage

## 2018-07-24 NOTE — Progress Notes (Signed)
Fentanyl 55mcg wasted, unable to find patient in pyxis, witnessed by Virgina Organ RN.

## 2018-07-24 NOTE — Addendum Note (Signed)
Addendum  created 07/24/18 0707 by Lollie Sails, CRNA   Charge Capture section accepted

## 2018-07-24 NOTE — ED Provider Notes (Signed)
[facility] EMERGENCY DEPARTMENT Provider Note   CSN: [id number] Arrival date & time: 07/24/18  1819     History   Chief Complaint Chief Complaint  Patient presents with  . Sore Throat    HPI Tricia Campbell is a 27 y.o. female.  Tricia Campbell is a 27 y.o. female history of appendicitis with appendectomy yesterday, who presents to the emergency department for evaluation of generalized pain as well as sore throat for the past day.  Patient arrives and reports that ever since she was discharged from the hospital yesterday she has been hurting all over, her throat is sore her body aches, she is got mild abdominal pain.  She reports she was prescribed tramadol which she took last at 8 AM this morning and this made her nauseated after which she had one episode of vomiting, but has since been able to keep down fluids and food but has not taken anything else for pain.  She was also prescribed Tylenol and ibuprofen but reports she has not taken these based on instructions given to her by the pharmacist.  Denies any focal abdominal pain, but is repeatedly saying she hurts all over.  Per chart review patient had a laparoscopic appendectomy performed yesterday and was discharged after uncomplicated hospital course.  Patient was intubated for the surgical procedure.  She was prescribed ibuprofen, Tylenol and tramadol to take for symptom management after discharge.     Past Medical History:  Diagnosis Date  . Vaginal delivery     Patient Active Problem List   Diagnosis Date Noted  . Appendicitis 07/23/2018  . 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 APPENDECTOMY    . LAPAROSCOPIC APPENDECTOMY N/A 07/23/2018   Procedure: APPENDECTOMY LAPAROSCOPIC;  Surgeon: Alphonsa Overall, MD;  Location: [facility];  Service: General;  Laterality: N/A;  . WISDOM TOOTH EXTRACTION       OB History    Gravida  2   Para  1   Term  1   Preterm      AB  1   Living  1     SAB      TAB  1   Ectopic      Multiple  0   Live Births  1            Home Medications    Prior to Admission medications   Medication Sig Start Date End Date Taking? Authorizing Provider  acetaminophen (TYLENOL) 325 MG tablet Take 2 tablets (650 mg total) by mouth every 4 (four) hours as needed for mild pain. 07/23/18 07/23/19  Rayburn, Floyce Stakes, PA-C  ibuprofen (ADVIL,MOTRIN) 600 MG tablet Take 1 tablet (600 mg total) by mouth every 6 (six) hours as needed for mild pain (for mild pain not relieved by other medications.). 07/23/18   Rayburn, Floyce Stakes, PA-C  ondansetron (ZOFRAN ODT) 4 MG disintegrating tablet 4mg  ODT q4 hours prn nausea/vomit 07/24/18   Jacqlyn Larsen, PA-C  traMADol (ULTRAM) 50 MG tablet Take 1 tablet (50 mg total) by mouth every 6 (six) hours as needed for moderate pain. 07/23/18   Rayburn, Floyce Stakes, PA-C    Family History Family History  Problem Relation Age of Onset  . Asthma Neg Hx   . Diabetes Neg Hx   . Heart disease Neg Hx   . Hypertension Neg Hx   . Kidney disease Neg Hx   . Stroke Neg Hx     Social  History Social History   Tobacco Use  . Smoking status: Former Smoker    Packs/day: 0.20    Types: Cigars    Last attempt to quit: 12/05/2015    Years since quitting: 2.6  . Smokeless tobacco: Never Used  Substance Use Topics  . Alcohol use: No  . Drug use: No     Allergies   Patient has no known allergies.   Review of Systems Review of Systems  Constitutional: Negative for chills and fever.  HENT: Positive for sore throat. Negative for congestion, postnasal drip, rhinorrhea, trouble swallowing and voice change.   Respiratory: Negative for cough and shortness of breath.   Cardiovascular: Negative for chest pain.  Gastrointestinal: Positive for abdominal pain and nausea. Negative for diarrhea and vomiting.  Genitourinary: Negative for dysuria.  Musculoskeletal: Positive for myalgias. Negative for  arthralgias and joint swelling.  Skin: Negative for color change and rash.  Neurological: Negative for dizziness, syncope and light-headedness.  Psychiatric/Behavioral: The patient is nervous/anxious.      Physical Exam Updated Vital Signs BP 133/80   Pulse 86   Temp 98.6 F (37 C) (Oral)   Resp 18   Ht 5\' 6"  (1.676 m)   Wt 54.9 kg   LMP 07/08/2018   SpO2 100%   BMI 19.54 kg/m   Physical Exam Vitals signs and nursing note reviewed.  Constitutional:      General: She is not in acute distress.    Appearance: She is well-developed. She is not ill-appearing, toxic-appearing or diaphoretic.     Comments: Tearful during evaluation but in no acute distress  HENT:     Head: Normocephalic and atraumatic.     Mouth/Throat:     Mouth: Mucous membranes are moist.     Pharynx: Oropharynx is clear.     Tonsils: No tonsillar exudate. Swelling: 0 on the right. 0 on the left.  Eyes:     General:        Right eye: No discharge.        Left eye: No discharge.  Neck:     Musculoskeletal: Neck supple.  Cardiovascular:     Rate and Rhythm: Normal rate and regular rhythm.     Heart sounds: Normal heart sounds. No murmur. No friction rub. No gallop.   Pulmonary:     Effort: Pulmonary effort is normal. No respiratory distress.     Breath sounds: Normal breath sounds.     Comments: Respirations equal and unlabored, patient able to speak in full sentences, lungs clear to auscultation bilaterally Abdominal:     General: Bowel sounds are normal. There is no distension.     Palpations: Abdomen is soft. There is no mass.     Tenderness: There is abdominal tenderness. There is no guarding or rebound.     Comments: Abdomen soft, nondistended, recent surgical scars present at the umbilicus and to the right lower quadrant with Dermabond in place, no surrounding erythema or swelling, no drainage.  Patient endorses mild generalized tenderness throughout but there is no focal area of guarding or rebound  tenderness, no peritoneal signs.  Skin:    General: Skin is warm and dry.  Neurological:     Mental Status: She is alert and oriented to person, place, and time.     Coordination: Coordination normal.  Psychiatric:        Mood and Affect: Mood normal.        Behavior: Behavior normal.      ED Treatments /  Results  Labs (all labs ordered are listed, but only abnormal results are displayed) Labs Reviewed - No data to display  EKG None  Radiology None  Procedures Procedures (including critical care time)  Medications Ordered in ED Medications  acetaminophen (TYLENOL) tablet 650 mg (650 mg Oral Given 07/24/18 1909)  ondansetron (ZOFRAN-ODT) disintegrating tablet 4 mg (4 mg Oral Given 07/24/18 1912)  lidocaine (XYLOCAINE) 2 % viscous mouth solution 15 mL (15 mLs Mouth/Throat Given 07/24/18 1911)  ibuprofen (ADVIL,MOTRIN) tablet 600 mg (600 mg Oral Given 07/24/18 1910)  traMADol (ULTRAM) tablet 50 mg (50 mg Oral Given 07/24/18 1952)     Initial Impression / Assessment and Plan / ED Course  I have reviewed the triage vital signs and the nursing notes.  Pertinent labs & imaging results that were available during my care of the patient were reviewed by me and considered in my medical decision making (see chart for details).  Presents to the emergency department tearful complaining of generalized pain throughout her body as well as sore throat and abdominal pain, patient was admitted to the hospital and had appendectomy performed yesterday morning was discharged yesterday after uncomplicated hospital course.  Patient took 1 dose of tramadol this morning which made her nauseated and has not taken anything else for pain since then now presenting with generalized pain throughout, patient appears tearful she is upset reporting she has a 40-year-old to care for at home and seems overwhelmed and stressed.  Patient was prescribed Tylenol, ibuprofen and tramadol at discharge but due to  consultation with pharmacist has not been taking ibuprofen or Tylenol at all.  She reports sore throat but denies any cough, fevers, rhinorrhea or congestion and I suspect this is likely related to a recent intubation, posterior oropharynx is clear without edema or exudates.  Had long discussion with patient about taking pain medications appropriately, printed out and reviewed discharge instructions from general surgery team with the patient, after treatment with pain medication, nausea medicine and viscous lidocaine she reports improvement in her symptoms, and at this time I feel she is stable for discharge home with close follow-up with surgery clinic, return precautions discussed.  Patient's abdominal exam is benign and I do not suspect any acute complication from surgery causing patient's pain.  Final Clinical Impressions(s) / ED Diagnoses   Final diagnoses:  Generalized pain  Sore throat  Post-op pain    ED Discharge Orders         Ordered    ondansetron (ZOFRAN ODT) 4 MG disintegrating tablet     07/24/18 1948           Jacqlyn Larsen, PA-C 07/25/18 0003    Isla Pence, MD 07/25/18 [id number]

## 2018-08-18 ENCOUNTER — Ambulatory Visit: Payer: Self-pay

## 2018-10-28 ENCOUNTER — Telehealth: Payer: Self-pay | Admitting: Obstetrics & Gynecology

## 2018-10-28 ENCOUNTER — Other Ambulatory Visit: Payer: Self-pay

## 2018-10-28 ENCOUNTER — Telehealth (INDEPENDENT_AMBULATORY_CARE_PROVIDER_SITE_OTHER): Payer: Self-pay

## 2018-10-28 ENCOUNTER — Ambulatory Visit: Payer: Self-pay

## 2018-10-28 DIAGNOSIS — O219 Vomiting of pregnancy, unspecified: Secondary | ICD-10-CM

## 2018-10-28 MED ORDER — PROMETHAZINE HCL 25 MG PO TABS: 25.0000 mg | ORAL_TABLET | Freq: Four times a day (QID) | ORAL | 0 refills | Status: DC | PRN

## 2018-10-28 NOTE — Telephone Encounter (Signed)
I connected with  Tricia Campbell on 10/28/18 at 0945 by telephone and verified that I am speaking with the correct person using two identifiers.   I discussed the limitations, risks, security and privacy concerns of performing an evaluation and management service by telephone and the availability of in person appointments. I also discussed with the patient that there may be a patient responsible charge related to this service. The patient expressed understanding and agreed to proceed.  Pt stated that she is currently pregnant and is having severe nausea.  Pt stated that she does not understand that no one wants to see her.  I explained the importance of COVID-19 and how it is important that she stays home due to her being high risk for the virus.  I informed pt that I can prescribe Pheneragan per our standing protocol.  And I encouraged pt to please try to eat crackers, rice, pototoes, and bland foods in hopes that she may tolerated it. I advised that the pt call the office with concerns.  Pt stated understanding with no further questions.   Verdell Carmine, RN 10/28/2018  10:01 AM

## 2018-10-28 NOTE — Telephone Encounter (Signed)
The patient called stating she had a positive pregnancy test. She would like to schedule an appointment as she has been extremely sick. I informed the patient of the option of a virtual visit and also at this time over the counter pregnancy test are being suggested to confirm pregnancy. Informed the patient of the intake visits 10-12 week marker. The patient became very upset and stated she should just get an abortion. Did I make up the rule of the 10-12 weeks? She needs to be seen to know how far along she is. Informed the patient of reviewing her concerns with the nurse. Informed the patient of scheduling the intake and the nurse will call shortly. Also confirmed the phone number.

## 2018-10-28 NOTE — Telephone Encounter (Signed)
Pt has been called see telephone encounter.

## 2018-10-30 ENCOUNTER — Telehealth: Payer: Self-pay | Admitting: Obstetrics and Gynecology

## 2018-10-30 NOTE — Telephone Encounter (Signed)
The patient called back, I informed of the updated appointment and the virtual visit, expect a call.

## 2018-10-30 NOTE — Telephone Encounter (Signed)
Called the patient to inform of change in scheduling due to restrictions based on the [place]. Left a detailed message of the patient expecting to receive a call which will be a virtual visit. Also sending a reminder letter.

## 2018-12-01 ENCOUNTER — Encounter: Payer: Self-pay | Admitting: Family Medicine

## 2018-12-01 ENCOUNTER — Telehealth: Payer: Self-pay | Admitting: Family Medicine

## 2018-12-01 NOTE — Telephone Encounter (Signed)
Attempted to call patient to inform her that her intake appointment will be done virtual so she did not have to come to our office. No answer, left detailed message with instructions to download app and office number was given if she had any questions or concerns.

## 2018-12-02 ENCOUNTER — Encounter: Payer: Self-pay | Admitting: *Deleted

## 2018-12-02 ENCOUNTER — Ambulatory Visit: Payer: Self-pay | Admitting: *Deleted

## 2018-12-02 ENCOUNTER — Other Ambulatory Visit: Payer: Self-pay

## 2018-12-02 NOTE — Progress Notes (Signed)
Called pt @ 1435 to begin New Ob interview via Webex as scheduled. Pt did not answer - message left that I will call back in a few minutes.  [place] pt second with no answer. I left message stating that we will need to reschedule this appointment. She will need to complete this New ob intake interview prior to having appointment in the office with a provider. Therefore the appointment scheduled on 5/13 may need to be rescheduled also. MyChart message sent to pt with this information.

## 2018-12-08 ENCOUNTER — Ambulatory Visit: Payer: Self-pay

## 2018-12-17 ENCOUNTER — Encounter: Payer: Self-pay | Admitting: Obstetrics & Gynecology

## 2019-01-02 ENCOUNTER — Encounter: Payer: Self-pay | Admitting: *Deleted

## 2019-01-02 ENCOUNTER — Other Ambulatory Visit: Payer: Self-pay

## 2019-01-02 ENCOUNTER — Telehealth: Payer: Self-pay | Admitting: *Deleted

## 2019-01-02 DIAGNOSIS — Z3201 Encounter for pregnancy test, result positive: Secondary | ICD-10-CM

## 2019-01-02 NOTE — Progress Notes (Unsigned)
I connected with  Tricia Campbell on 01/02/19 at  8:15 AM EDT by telephone and verified that I am speaking with the correct person using two identifiers.   I discussed the limitations, risks, security and privacy concerns of performing an evaluation and management service by telephone and virtually and the availability of in person appointments. I also discussed with the patient that there may be a patient responsible charge related to this service. The patient expressed understanding and agreed to proceed. I assisted her with accessing her  MyChart app . She has several interruptions while we talked- picking up child, etc.  She confirms  her LMP was 09/12/18 and she had a positive pregnancy test but then she had an abortion since then around November 12, 2018.States she just wanted to get checked to make sure her body is ok.  I explained today had been for new ob intake with nurse ; but we can schedule her for a checkup with a provider. She states she has appt Monday for nurse visit for self swab for std's. I asked if she was having any problems and she states no- denies pain or unusual bleeding.  I informed her she can keep her appt Monday and we will cancel lher New ob and schedule her for checkup with a provider. She voices understanding.   Hillarie Harrigan,RN 01/02/2019  8:26 AM

## 2019-01-05 ENCOUNTER — Ambulatory Visit (INDEPENDENT_AMBULATORY_CARE_PROVIDER_SITE_OTHER): Payer: Self-pay | Admitting: Lactation Services

## 2019-01-05 ENCOUNTER — Other Ambulatory Visit: Payer: Self-pay

## 2019-01-05 DIAGNOSIS — Z113 Encounter for screening for infections with a predominantly sexual mode of transmission: Secondary | ICD-10-CM

## 2019-01-05 DIAGNOSIS — N760 Acute vaginitis: Secondary | ICD-10-CM

## 2019-01-05 DIAGNOSIS — B9689 Other specified bacterial agents as the cause of diseases classified elsewhere: Secondary | ICD-10-CM

## 2019-01-05 DIAGNOSIS — N898 Other specified noninflammatory disorders of vagina: Secondary | ICD-10-CM

## 2019-01-05 NOTE — Progress Notes (Signed)
Pt here for self swab to rule out STD's as she reprots she has had an encounter with a previous partner with a history of STD. She reports she has had a white discharge recently with no odor, burning or itching.   Pt reports she had an abortion in April. Pt reports she wants to have a visit with a provider to have a pelvic exam, blood drawn to rule out HIV and other STD's and to discuss Birth Control. Pt was to schedule a follow up appt at checkout.   Pt reports no further questions/concerns at this time. Informed her she would be notified of any abnormal swab results.

## 2019-01-06 ENCOUNTER — Telehealth: Payer: Self-pay

## 2019-01-06 LAB — CERVICOVAGINAL ANCILLARY ONLY
Bacterial vaginitis: POSITIVE — AB
Candida vaginitis: NEGATIVE
Chlamydia: NEGATIVE
Neisseria Gonorrhea: NEGATIVE
Trichomonas: NEGATIVE

## 2019-01-08 NOTE — Progress Notes (Signed)
I have reviewed this chart and agree with the RN/CMA assessment and management.    Alima Naser C Violet Seabury, MD, FACOG Attending Physician, [facility]  

## 2019-01-09 ENCOUNTER — Other Ambulatory Visit: Payer: Self-pay | Admitting: Obstetrics and Gynecology

## 2019-01-09 MED ORDER — METRONIDAZOLE 500 MG PO TABS: 500.0000 mg | ORAL_TABLET | Freq: Two times a day (BID) | ORAL | 0 refills | Status: AC

## 2019-01-13 ENCOUNTER — Telehealth: Payer: Self-pay | Admitting: Emergency Medicine

## 2019-01-13 NOTE — Telephone Encounter (Signed)
Pt called and informed of lab results. Pt was informed that she had a prescription for metronidazole 500 mg had been sent to the pharmacy and to take as directed. Pt verbalized understanding and had no further questions.

## 2019-01-14 ENCOUNTER — Telehealth: Payer: Self-pay | Admitting: Family Medicine

## 2019-01-14 ENCOUNTER — Telehealth: Payer: Self-pay | Admitting: Obstetrics & Gynecology

## 2019-01-14 NOTE — Telephone Encounter (Signed)
Called patient to ask her about any symptoms, and to wear her mask the whole visit covering her nose and mouth. Also, to sanitize her hands upon arriving

## 2019-01-14 NOTE — Telephone Encounter (Signed)
Called patient, instructed her to wear a face mask also at this time no visitors are allowed due to [place], patient state she have no symptoms.

## 2019-01-16 ENCOUNTER — Ambulatory Visit: Payer: Self-pay | Admitting: Obstetrics & Gynecology

## 2019-01-19 ENCOUNTER — Encounter: Payer: Self-pay | Admitting: Obstetrics & Gynecology

## 2019-01-29 IMAGING — CT CT ABD-PELV W/ CM
2 of 4 series · 15 of 46 positions shown, 17 images · IV contrast (APPLIED)
Comparison: None.

CLINICAL DATA: Abdominal pain, nausea and vomiting. Clinical
concern for appendicitis.

EXAM:
CT ABDOMEN AND PELVIS WITH CONTRAST
TECHNIQUE: Multidetector CT imaging of the abdomen and pelvis was performed
using the standard protocol following bolus administration of
intravenous contrast.
CONTRAST:  100mL KUVOB9-JII IOPAMIDOL (KUVOB9-JII) INJECTION 61%

[Series 2: axial st · axial · 0.70mm/px · z∈[+306,+696]mm · 12 of 90 slices shown, 14 images]
[im 8/90  soft-tissue]
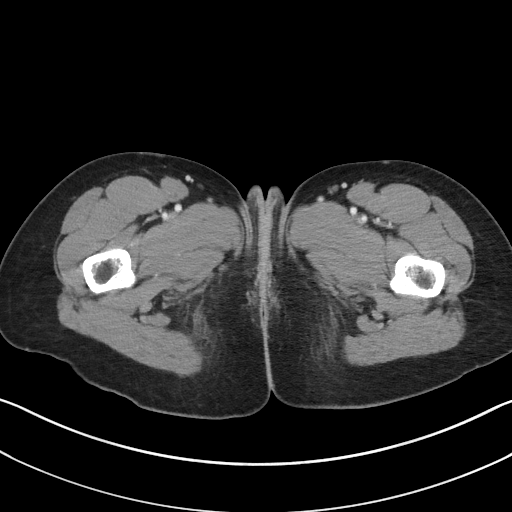
[im 8/90  bone]
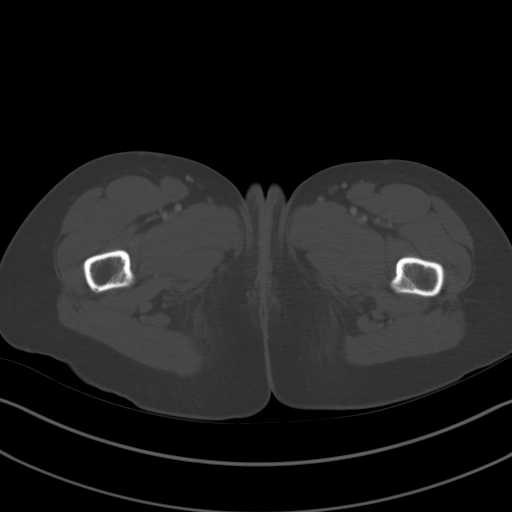
[im 15/90  soft-tissue]
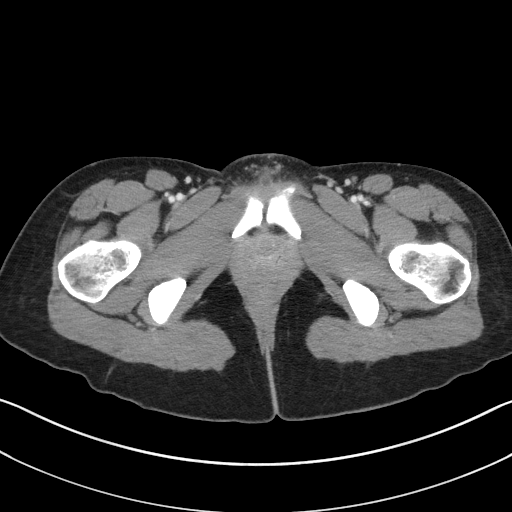
[im 22/90  soft-tissue]
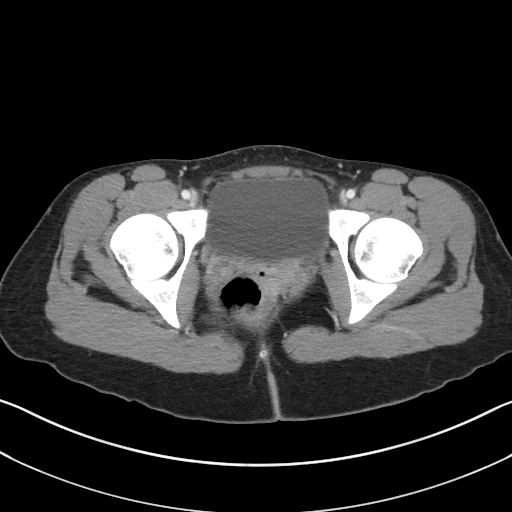
[im 29/90  soft-tissue]
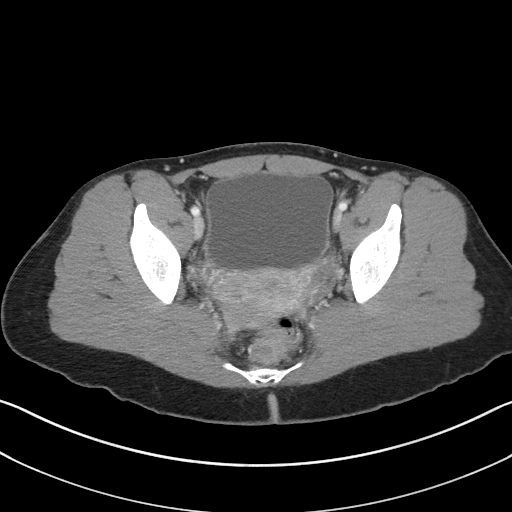
[im 36/90  soft-tissue]
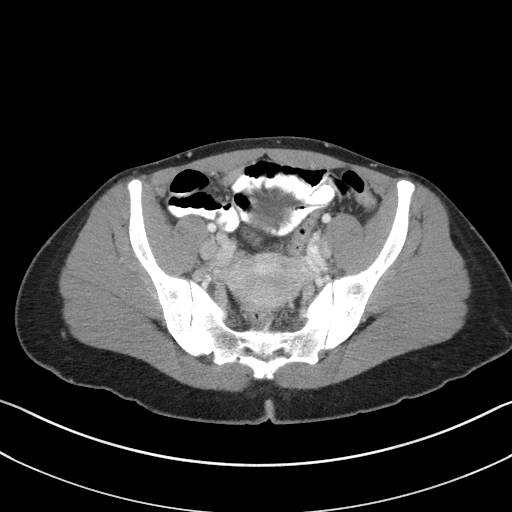
[im 43/90  soft-tissue]
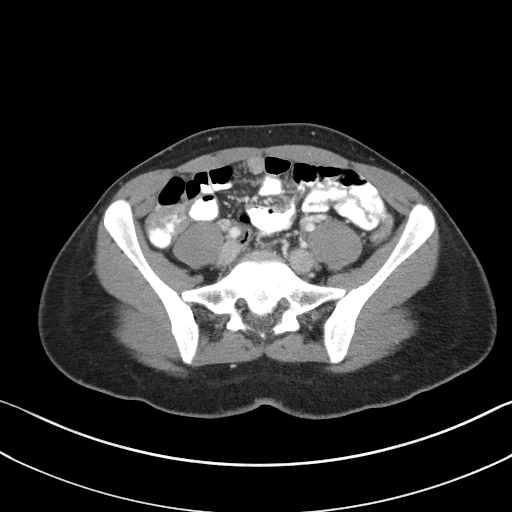
[im 50/90  soft-tissue]
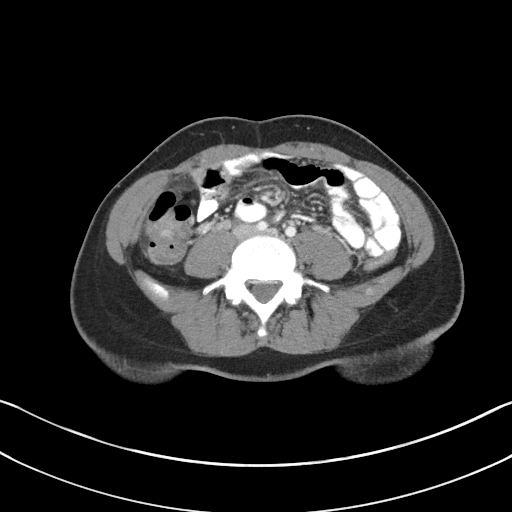
[im 57/90  soft-tissue]
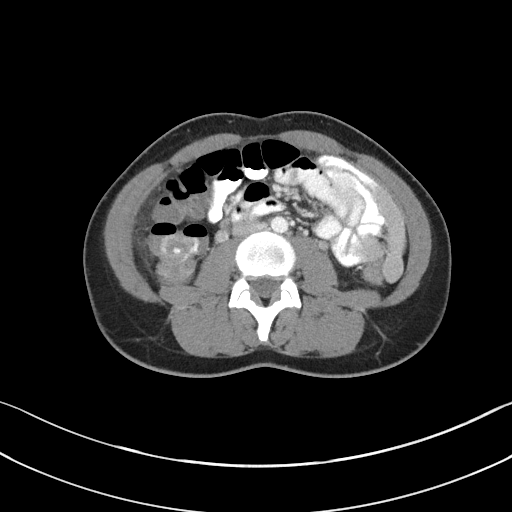
[im 65/90  soft-tissue]
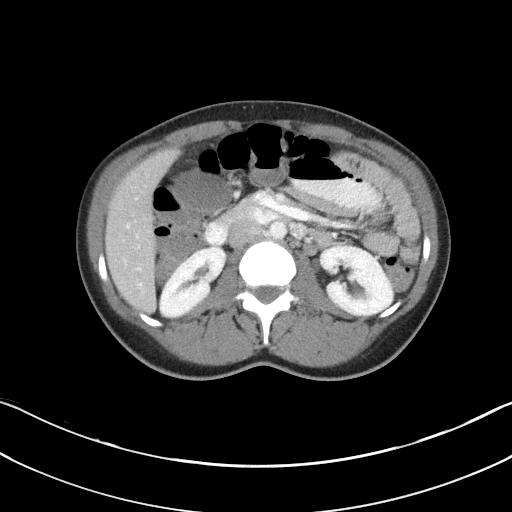
[im 65/90  bone]
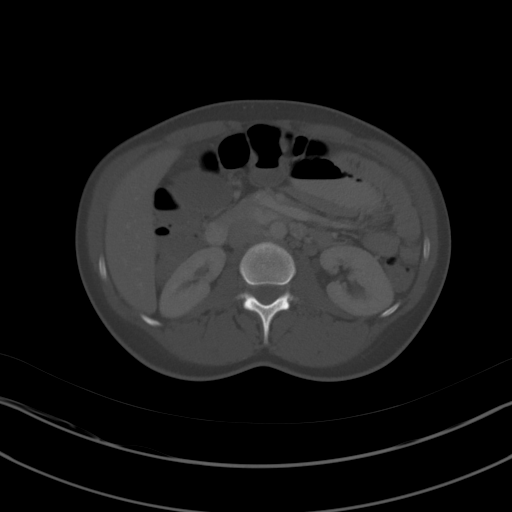
[im 72/90  soft-tissue]
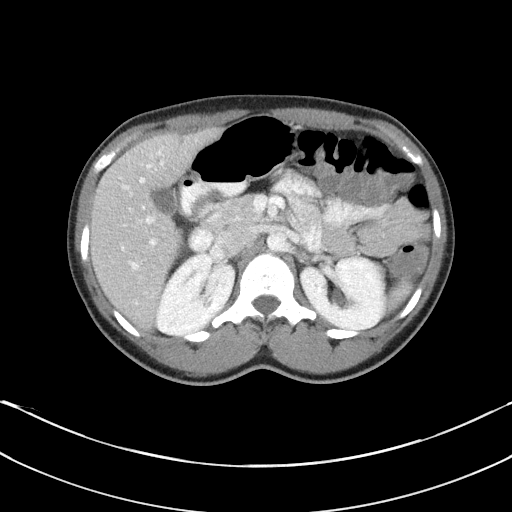
[im 79/90  soft-tissue]
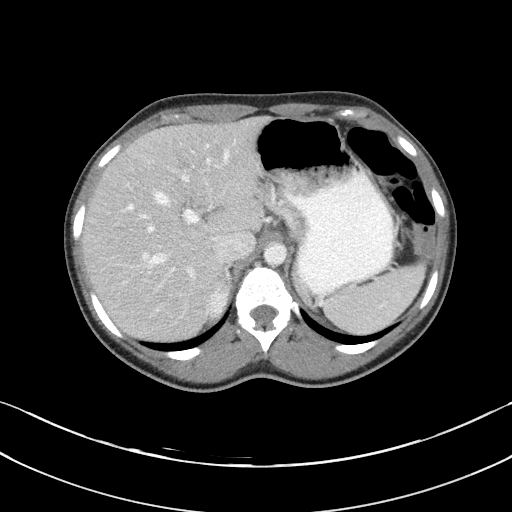
[im 86/90  soft-tissue]
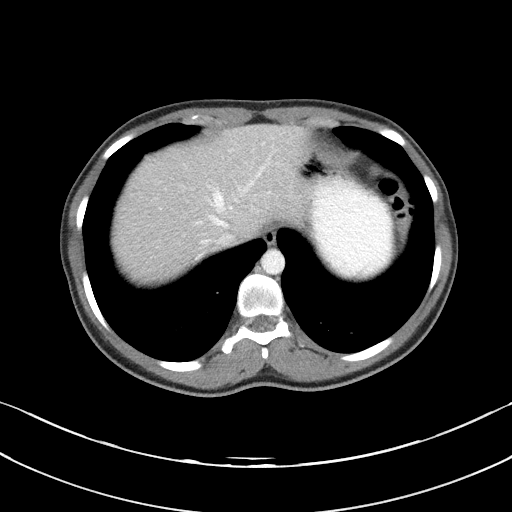

[Series 5: coronal st · coronal · 0.67mm/px · 3 of 68 slices shown]
[im 23/68  soft-tissue]
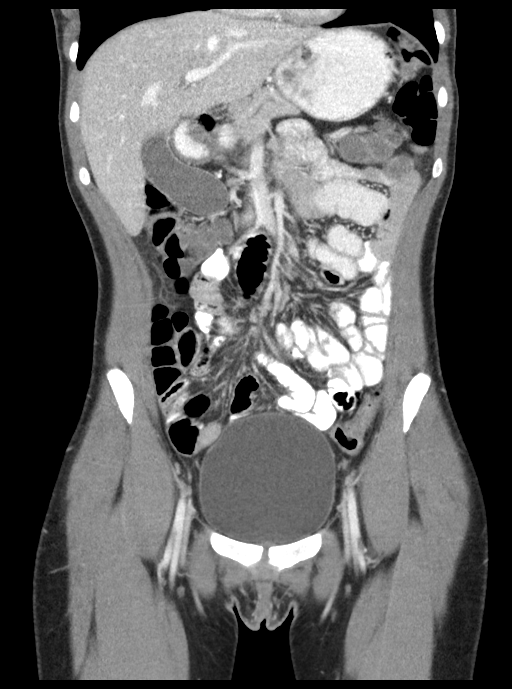
[im 30/68  soft-tissue]
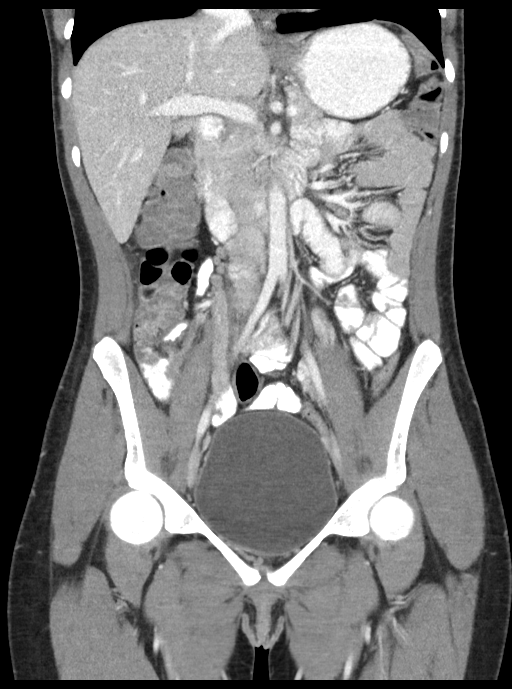
[im 38/68  soft-tissue]
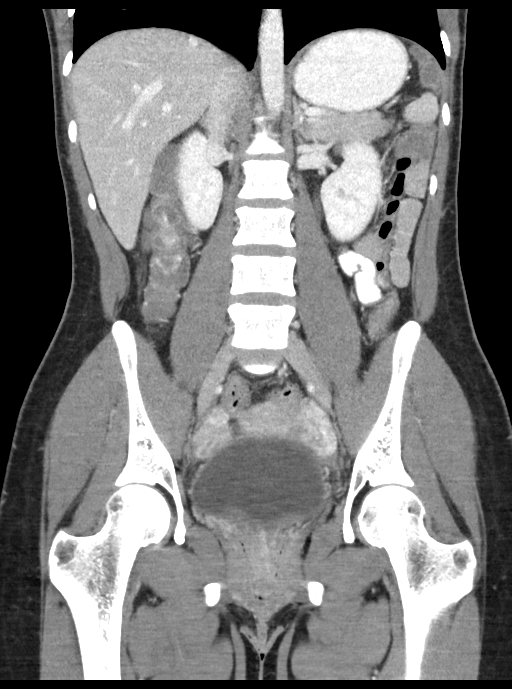

[15 of 46 positions shown; findings below may reference images not displayed]

FINDINGS: Lower chest: Lung bases are clear.

Hepatobiliary: No focal liver abnormality is seen. No gallstones,
gallbladder wall thickening, or biliary dilatation.

Pancreas: No ductal dilatation or inflammation.

Spleen: Normal in size without focal abnormality.

Adrenals/Urinary Tract: Normal adrenal glands. No hydronephrosis or
perinephric edema. Homogeneous renal enhancement. Urinary bladder is
physiologically distended without wall thickening.

Stomach/Bowel: Low-density in the ascending colon suspicious for
colonic wall thickening, as well as liquid stool, wall thickening
best appreciated on coronal and sagittal reformats, for example
image 36 series 5 and image 25 series 6. Mild pericolonic fat
stranding in the pericolic gutter. Fluid levels in the transverse
colon likely liquid stool rather than additional colonic wall
thickening. The appendix is mildly dilated measuring 8 mm image 30
series 5. Mild appendiceal wall thickening but no intraluminal
fluid, appendicoliths, or periappendiceal inflammation. No small
bowel dilatation or obstruction, administered enteric contrast
reaches the ascending colon. Stomach is physiologically distended.

Vascular/Lymphatic: Abdominal aorta is normal in caliber. Portal and
splenic veins and mesenteric vessels are patent. Mild prominence of
the gonadal veins and increased adnexal vascularity.

Reproductive: Mild increased bilateral adnexal and periuterine
vascularity. No adnexal mass.

Other: Minimal free fluid in the pelvis may be physiologic or
reactive. No free fluid elsewhere. No free air. No intra-abdominal
abscess.

Musculoskeletal: There are no acute or suspicious osseous
abnormalities.
IMPRESSION: 1. Low-density in the ascending colon is likely combination of
colonic wall thickening and liquid stool. Mild pericolonic edema in
the right pericolic gutter. Findings suspicious for ascending
colitis. Differential diagnosis include infectious or inflammatory
etiologies.
2. Mildly enlarged appendix at 8 mm with wall thickening but no
intraluminal fluid, appendicolith, or periappendiceal inflammation.
Imaging findings are equivocal for acute appendicitis.

## 2019-05-13 ENCOUNTER — Telehealth: Payer: Self-pay | Admitting: Obstetrics and Gynecology

## 2019-05-13 NOTE — Telephone Encounter (Signed)
Called the patient to confirm the appointment. The patient answered no to the covid19 screening questions. Also advised the patient of no children or visitors due to covid19 restrictions.

## 2019-05-14 ENCOUNTER — Ambulatory Visit (INDEPENDENT_AMBULATORY_CARE_PROVIDER_SITE_OTHER): Payer: Self-pay | Admitting: Obstetrics and Gynecology

## 2019-05-14 ENCOUNTER — Encounter: Payer: Self-pay | Admitting: Obstetrics and Gynecology

## 2019-05-14 ENCOUNTER — Other Ambulatory Visit: Payer: Self-pay

## 2019-05-14 VITALS — BP 108/71 | HR 96 | Wt 129.9 lb

## 2019-05-14 DIAGNOSIS — Z113 Encounter for screening for infections with a predominantly sexual mode of transmission: Secondary | ICD-10-CM

## 2019-05-14 DIAGNOSIS — B373 Candidiasis of vulva and vagina: Secondary | ICD-10-CM

## 2019-05-14 DIAGNOSIS — B9689 Other specified bacterial agents as the cause of diseases classified elsewhere: Secondary | ICD-10-CM

## 2019-05-14 DIAGNOSIS — N898 Other specified noninflammatory disorders of vagina: Secondary | ICD-10-CM

## 2019-05-14 DIAGNOSIS — N760 Acute vaginitis: Secondary | ICD-10-CM

## 2019-05-14 DIAGNOSIS — Z202 Contact with and (suspected) exposure to infections with a predominantly sexual mode of transmission: Secondary | ICD-10-CM

## 2019-05-14 LAB — POCT PREGNANCY, URINE: Preg Test, Ur: NEGATIVE

## 2019-05-14 NOTE — Progress Notes (Signed)
Patient was not see by a provider today d/t insurance.   Noni Saupe I, NP 05/14/2019 11:15 AM

## 2019-05-14 NOTE — Progress Notes (Deleted)
GYNECOLOGY ANNUAL PREVENTATIVE CARE ENCOUNTER NOTE  History:     Tricia Campbell is a 28 y.o. G68P1011 female here for a routine annual gynecologic exam.  Current complaints: ***.   Denies abnormal vaginal bleeding, discharge, pelvic pain, problems with intercourse or other gynecologic concerns.    Gynecologic History Patient's last menstrual period was 05/01/2019 (approximate). Contraception: {method:5051} Last Pap: ***. Results were: {norm/abn:16337} with negative HPV Last mammogram: ***. Results were: {norm/abn:16337}  Obstetric History OB History  Gravida Para Term Preterm AB Living  3 1 1   1 1   SAB TAB Ectopic Multiple Live Births    1   0 1    # Outcome Date GA Lbr Len/2nd Weight Sex Delivery Anes PTL Lv  3 Gravida           2 Term 02/25/17 [redacted]w[redacted]d 04:55 / 00:12 7 lb 7.1 oz (3.375 kg) M Vag-Spont None  LIV  1 TAB             Past Medical History:  Diagnosis Date  . Vaginal delivery     Past Surgical History:  Procedure Laterality Date  . APPENDECTOMY    . LAPAROSCOPIC APPENDECTOMY N/A 07/23/2018   Procedure: APPENDECTOMY LAPAROSCOPIC;  Surgeon: Alphonsa Overall, MD;  Location: [facility];  Service: General;  Laterality: N/A;  . WISDOM TOOTH EXTRACTION      Current Outpatient Medications on File Prior to Visit  Medication Sig Dispense Refill  . acetaminophen (TYLENOL) 325 MG tablet Take 2 tablets (650 mg total) by mouth every 4 (four) hours as needed for mild pain.    Marland Kitchen ibuprofen (ADVIL,MOTRIN) 600 MG tablet Take 1 tablet (600 mg total) by mouth every 6 (six) hours as needed for mild pain (for mild pain not relieved by other medications.).  0   No current facility-administered medications on file prior to visit.     No Known Allergies  Social History:  reports that she quit smoking about 3 years ago. Her smoking use included cigars. She smoked 0.20 packs per day. She has never used smokeless tobacco. She reports that she does not drink alcohol or use drugs.   Family History  Problem Relation Age of Onset  . Asthma Neg Hx   . Diabetes Neg Hx   . Heart disease Neg Hx   . Hypertension Neg Hx   . Kidney disease Neg Hx   . Stroke Neg Hx     The following portions of the patient's history were reviewed and updated as appropriate: allergies, current medications, past family history, past medical history, past social history, past surgical history and problem list.  Review of Systems Pertinent items noted in HPI and remainder of comprehensive ROS otherwise negative.  Physical Exam:  BP 108/71   Pulse 96   Wt 129 lb 14.4 oz (58.9 kg)   LMP 05/01/2019 (Approximate)   Breastfeeding Unknown   BMI 20.97 kg/m  CONSTITUTIONAL: Well-developed, well-nourished female in no acute distress.  HENT:  Normocephalic, atraumatic, External right and left ear normal. Oropharynx is clear and moist EYES: Conjunctivae and EOM are normal. Pupils are equal, round, and reactive to light. No scleral icterus.  NECK: Normal range of motion, supple, no masses.  Normal thyroid.  SKIN: Skin is warm and dry. No rash noted. Not diaphoretic. No erythema. No pallor. MUSCULOSKELETAL: Normal range of motion. No tenderness.  No cyanosis, clubbing, or edema.  2+ distal pulses. NEUROLOGIC: Alert and oriented to person, place, and time. Normal reflexes, muscle tone coordination.  No cranial nerve deficit noted. PSYCHIATRIC: Normal mood and affect. Normal behavior. Normal judgment and thought content. CARDIOVASCULAR: Normal heart rate noted, regular rhythm RESPIRATORY: Clear to auscultation bilaterally. Effort and breath sounds normal, no problems with respiration noted. BREASTS: Symmetric in size. No masses, skin changes, nipple drainage, or lymphadenopathy. ABDOMEN: Soft, normal bowel sounds, no distention noted.  No tenderness, rebound or guarding.  PELVIC: Normal appearing external genitalia; normal appearing vaginal mucosa and cervix.  No abnormal discharge noted.  Pap smear  obtained.  Normal uterine size, no other palpable masses, no uterine or adnexal tenderness.   Assessment and Plan:    1. STD exposure *** - Cervicovaginal ancillary only( [place])  Will follow up results of pap smear and manage accordingly. Mammogram scheduled Routine preventative health maintenance measures emphasized. Please refer to After Visit Summary for other counseling recommendations.       Product/process development scientist for [employer], [place]

## 2019-05-25 ENCOUNTER — Telehealth: Payer: Self-pay | Admitting: Obstetrics and Gynecology

## 2019-05-25 LAB — CERVICOVAGINAL ANCILLARY ONLY
Bacterial Vaginitis (gardnerella): POSITIVE — AB
Candida Glabrata: NEGATIVE
Candida Vaginitis: POSITIVE — AB
Chlamydia: NEGATIVE
Comment: NEGATIVE
Comment: NEGATIVE
Comment: NEGATIVE
Comment: NEGATIVE
Comment: NEGATIVE
Comment: NORMAL
Neisseria Gonorrhea: NEGATIVE
Trichomonas: POSITIVE — AB

## 2019-05-25 MED ORDER — FLUCONAZOLE 150 MG PO TABS: 150.0000 mg | ORAL_TABLET | Freq: Once | ORAL | 0 refills | Status: AC

## 2019-05-25 MED ORDER — METRONIDAZOLE 500 MG PO TABS: 500.0000 mg | ORAL_TABLET | Freq: Two times a day (BID) | ORAL | 0 refills | Status: DC

## 2019-05-25 NOTE — Telephone Encounter (Signed)
+   trichomonas, yeast infection, and BV  RX: sent for flagyl x 7 days and diflucan   Noni Saupe I, NP 05/25/2019 3:41 PM

## 2019-05-26 ENCOUNTER — Telehealth: Payer: Self-pay | Admitting: Student

## 2019-05-26 NOTE — Telephone Encounter (Signed)
Patient called with questions about her medication; clarified with her how to take her medicines and what they are for (BV/trich and yeast).

## 2019-12-31 ENCOUNTER — Ambulatory Visit: Payer: Self-pay | Admitting: Obstetrics and Gynecology

## 2020-03-09 ENCOUNTER — Ambulatory Visit (INDEPENDENT_AMBULATORY_CARE_PROVIDER_SITE_OTHER): Payer: Medicaid Other | Admitting: General Practice

## 2020-03-09 ENCOUNTER — Other Ambulatory Visit (HOSPITAL_COMMUNITY)
Admission: RE | Admit: 2020-03-09 | Discharge: 2020-03-09 | Disposition: A | Discharge status: Home / Self Care | Payer: Medicaid Other | Source: Ambulatory Visit | Attending: Family Medicine | Admitting: Family Medicine

## 2020-03-09 ENCOUNTER — Other Ambulatory Visit: Payer: Self-pay

## 2020-03-09 DIAGNOSIS — Z113 Encounter for screening for infections with a predominantly sexual mode of transmission: Secondary | ICD-10-CM | POA: Insufficient documentation

## 2020-03-09 NOTE — Progress Notes (Signed)
Patient presents to office today requesting STD testing and Depo Provera. She reports recent unprotected intercourse. Discussed with patient we could do Depo Provera at her annual exam on 9/16 but not today given recent intercourse. Patient was instructed in self swab & specimen collected. Blood work also completed today as well. Discussed results will be back in 24-48 hours and available via mychart. Patient verbalized understanding.   Koren Bound RN BSN 03/09/20

## 2020-03-10 LAB — CERVICOVAGINAL ANCILLARY ONLY
Chlamydia: NEGATIVE
Comment: NEGATIVE
Comment: NEGATIVE
Comment: NORMAL
Neisseria Gonorrhea: NEGATIVE
Trichomonas: NEGATIVE

## 2020-03-10 LAB — HIV ANTIBODY (ROUTINE TESTING W REFLEX): HIV Screen 4th Generation wRfx: NONREACTIVE

## 2020-03-10 LAB — RPR: RPR Ser Ql: NONREACTIVE

## 2020-03-10 LAB — HEPATITIS B SURFACE ANTIGEN: Hepatitis B Surface Ag: NEGATIVE

## 2020-03-10 LAB — HEPATITIS C ANTIBODY: Hep C Virus Ab: <0.1 s/co ratio (ref 0.0–0.9)

## 2020-03-14 NOTE — Progress Notes (Signed)
Patient was assessed and managed by nursing staff during this encounter. I have reviewed the chart and agree with the documentation and plan. I have also made any necessary editorial changes.  Aletha Halim, MD 03/14/2020 8:54 AM

## 2020-04-21 ENCOUNTER — Ambulatory Visit (INDEPENDENT_AMBULATORY_CARE_PROVIDER_SITE_OTHER): Payer: Medicaid Other | Admitting: Obstetrics and Gynecology

## 2020-04-21 ENCOUNTER — Other Ambulatory Visit: Payer: Self-pay

## 2020-04-21 DIAGNOSIS — O209 Hemorrhage in early pregnancy, unspecified: Secondary | ICD-10-CM

## 2020-04-21 LAB — POCT PREGNANCY, URINE: Preg Test, Ur: POSITIVE — AB

## 2020-04-21 NOTE — Progress Notes (Signed)
° ° °  GYNECOLOGY ANNUAL PREVENTATIVE CARE ENCOUNTER NOTE  History:     Tricia Campbell is a 29 y.o. G15P1011 female here for a routine annual gynecologic exam.  Patient with positive pregnancy test and cramping. Sent to MAU for evaluation. Unable to perform [place] here in office.   Lezlie Lye, NP 04/23/2020 12:40 PM

## 2020-04-21 NOTE — Progress Notes (Signed)
MAU called & notified of patient's arrival

## 2020-04-22 ENCOUNTER — Ambulatory Visit: Payer: Medicaid Other | Admitting: Medical

## 2020-04-26 ENCOUNTER — Other Ambulatory Visit: Payer: Self-pay

## 2020-04-26 ENCOUNTER — Encounter (HOSPITAL_COMMUNITY): Payer: Self-pay | Admitting: Obstetrics and Gynecology

## 2020-04-26 ENCOUNTER — Inpatient Hospital Stay (HOSPITAL_COMMUNITY): Payer: Medicaid Other

## 2020-04-26 ENCOUNTER — Inpatient Hospital Stay (HOSPITAL_COMMUNITY)
Admission: AD | Admit: 2020-04-26 | Discharge: 2020-04-26 | Disposition: A | Discharge status: Home / Self Care | Payer: Medicaid Other | Attending: Obstetrics and Gynecology | Admitting: Obstetrics and Gynecology

## 2020-04-26 DIAGNOSIS — Z79899 Other long term (current) drug therapy: Secondary | ICD-10-CM | POA: Insufficient documentation

## 2020-04-26 DIAGNOSIS — O3680X0 Pregnancy with inconclusive fetal viability, not applicable or unspecified: Secondary | ICD-10-CM | POA: Diagnosis not present

## 2020-04-26 DIAGNOSIS — O209 Hemorrhage in early pregnancy, unspecified: Secondary | ICD-10-CM | POA: Diagnosis not present

## 2020-04-26 DIAGNOSIS — Z87891 Personal history of nicotine dependence: Secondary | ICD-10-CM | POA: Insufficient documentation

## 2020-04-26 DIAGNOSIS — Z3A00 Weeks of gestation of pregnancy not specified: Secondary | ICD-10-CM | POA: Diagnosis not present

## 2020-04-26 DIAGNOSIS — O4690 Antepartum hemorrhage, unspecified, unspecified trimester: Secondary | ICD-10-CM

## 2020-04-26 DIAGNOSIS — Z3A01 Less than 8 weeks gestation of pregnancy: Secondary | ICD-10-CM | POA: Insufficient documentation

## 2020-04-26 LAB — CBC
HCT: 39.9 % (ref 36.0–46.0)
Hemoglobin: 12.6 g/dL (ref 12.0–15.0)
MCH: 30.9 pg (ref 26.0–34.0)
MCHC: 31.6 g/dL (ref 30.0–36.0)
MCV: 97.8 fL (ref 80.0–100.0)
Platelets: 239 10*3/uL (ref 150–400)
RBC: 4.08 MIL/uL (ref 3.87–5.11)
RDW: 14.2 % (ref 11.5–15.5)
WBC: 7.7 10*3/uL (ref 4.0–10.5)
nRBC: 0 % (ref 0.0–0.2)

## 2020-04-26 LAB — HCG, QUANTITATIVE, PREGNANCY: hCG, Beta Chain, Quant, S: 119 m[IU]/mL — ABNORMAL HIGH (ref <5)

## 2020-04-26 NOTE — MAU Note (Signed)
. °  Tricia Campbell is a 29 y.o.  here in MAU reporting: she is pregnant and wants to know how far pregnant she is. [facility] did not see baby on U/S. Pt denies any pain  LMP: 04/21/20 Onset of complaint: ongoing Pain score: 0 Vitals:   04/26/20 1520  BP: 103/61  Pulse: 70  Resp: 16  Temp: 98.1 F (36.7 C)     FHT: Lab orders placed from triage:

## 2020-04-26 NOTE — Discharge Instructions (Signed)
Return to care  If you have heavier bleeding that soaks through more that 2 pads per hour for an hour or more If you bleed so much that you feel like you might pass out or you do pass out If you have significant abdominal pain that is not improved with Tylenol   

## 2020-04-26 NOTE — MAU Provider Note (Signed)
Chief Complaint: Vaginal Bleeding   First Provider Initiated Contact with Patient 04/26/20 1523     SUBJECTIVE HPI: Tricia Campbell is a 29 y.o. G3P1011 at Unknown who presents to Maternity Admissions reporting vaginal bleeding. [facility] after having a positive pregnnacy test; didn't see anything on ultrasound. Started bleeding on 9/13. Went to [facility] for her annual last Thursday. Was still bleeding & no IUP seen on ultrasound performed by nurse. At that time, she was instructed to come to MAU. Did not come on Thursday due to child care. States bleeding stopped over the weekend and denies abdominal pain at this time.    Past Medical History:  Diagnosis Date  . Vaginal delivery    OB History  Gravida Para Term Preterm AB Living  3 1 1   1 1   SAB TAB Ectopic Multiple Live Births    1   0 1    # Outcome Date GA Lbr Len/2nd Weight Sex Delivery Anes PTL Lv  3 Current           2 Term 02/25/17 [redacted]w[redacted]d 04:55 / 00:12 3375 g M Vag-Spont None  LIV  1 TAB            Past Surgical History:  Procedure Laterality Date  . APPENDECTOMY    . LAPAROSCOPIC APPENDECTOMY N/A 07/23/2018   Procedure: APPENDECTOMY LAPAROSCOPIC;  Surgeon: Alphonsa Overall, MD;  Location: [facility];  Service: General;  Laterality: N/A;  . WISDOM TOOTH EXTRACTION     Social History   Socioeconomic History  . Marital status: Single    Spouse name: Not on file  . Number of children: Not on file  . Years of education: Not on file  . Highest education level: Not on file  Occupational History  . Not on file  Tobacco Use  . Smoking status: Former Smoker    Packs/day: 0.20    Types: Cigars    Quit date: 12/05/2015    Years since quitting: 4.3  . Smokeless tobacco: Never Used  Substance and Sexual Activity  . Alcohol use: No  . Drug use: No  . Sexual activity: Yes    Birth control/protection: Injection    Comment: Depo  Other Topics Concern  . Not on file  Social History Narrative  .  Not on file   Social Determinants of Health   Financial Resource Strain:   . Difficulty of Paying Living Expenses: Not on file  Food Insecurity:   . Worried About Charity fundraiser in the Last Year: Not on file  . Ran Out of Food in the Last Year: Not on file  Transportation Needs:   . Lack of Transportation (Medical): Not on file  . Lack of Transportation (Non-Medical): Not on file  Physical Activity:   . Days of Exercise per Week: Not on file  . Minutes of Exercise per Session: Not on file  Stress:   . Feeling of Stress : Not on file  Social Connections:   . Frequency of Communication with Friends and Family: Not on file  . Frequency of Social Gatherings with Friends and Family: Not on file  . Attends Religious Services: Not on file  . Active Member of Clubs or Organizations: Not on file  . Attends Archivist Meetings: Not on file  . Marital Status: Not on file  Intimate Partner Violence:   . Fear of Current or Ex-Partner: Not on file  . Emotionally Abused: Not on file  .  Physically Abused: Not on file  . Sexually Abused: Not on file   Family History  Problem Relation Age of Onset  . Asthma Neg Hx   . Diabetes Neg Hx   . Heart disease Neg Hx   . Hypertension Neg Hx   . Kidney disease Neg Hx   . Stroke Neg Hx    No current facility-administered medications on file prior to encounter.   Current Outpatient Medications on File Prior to Encounter  Medication Sig Dispense Refill  . ibuprofen (ADVIL,MOTRIN) 600 MG tablet Take 1 tablet (600 mg total) by mouth every 6 (six) hours as needed for mild pain (for mild pain not relieved by other medications.).  0  . metroNIDAZOLE (FLAGYL) 500 MG tablet Take 1 tablet (500 mg total) by mouth 2 (two) times daily. 14 tablet 0   No Known Allergies  I have reviewed patient's Past Medical Hx, Surgical Hx, Family Hx, Social Hx, medications and allergies.   Review of Systems  Constitutional: Negative.   Gastrointestinal:  Negative.   Genitourinary: Positive for vaginal bleeding (not currently).    OBJECTIVE Patient Vitals for the past 24 hrs:  BP Temp Pulse Resp Height Weight  04/26/20 1520 103/61 98.1 F (36.7 C) 70 16 5\' 6"  (1.676 m) 56.2 kg   Constitutional: Well-developed, well-nourished female in no acute distress.  Cardiovascular: normal rate & rhythm, no murmur Respiratory: normal rate and effort. Lung sounds clear throughout GI: Abd soft, non-tender, Pos BS x 4. No guarding or rebound tenderness MS: Extremities nontender, no edema, normal ROM Neurologic: Alert and oriented x 4.      LAB RESULTS Results for orders placed or performed during the hospital encounter of 04/26/20 (from the past 24 hour(s))  CBC     Status: None   Collection Time: 04/26/20  3:42 PM  Result Value Ref Range   WBC 7.7 4.0 - 10.5 K/uL   RBC 4.08 3.87 - 5.11 MIL/uL   Hemoglobin 12.6 12.0 - 15.0 g/dL   HCT 39.9 36 - 46 %   MCV 97.8 80.0 - 100.0 fL   MCH 30.9 26.0 - 34.0 pg   MCHC 31.6 30.0 - 36.0 g/dL   RDW 14.2 11.5 - 15.5 %   Platelets 239 150 - 400 K/uL   nRBC 0.0 0.0 - 0.2 %  hCG, quantitative, pregnancy     Status: Abnormal   Collection Time: 04/26/20  3:42 PM  Result Value Ref Range   hCG, Beta Chain, Quant, S 119 (H) <5 mIU/mL    IMAGING US OB LESS THAN 14 WEEKS WITH OB TRANSVAGINAL  Result Date: 04/26/2020 CLINICAL DATA:  Dating. EXAM: OBSTETRIC <14 WK [place] AND TRANSVAGINAL OB US TECHNIQUE: Both transabdominal and transvaginal ultrasound examinations were performed for complete evaluation of the gestation as well as the maternal uterus, adnexal regions, and pelvic cul-de-sac. Transvaginal technique was performed to assess early pregnancy. COMPARISON:  None. FINDINGS: Intrauterine gestational sac: None Yolk sac:  Not Visualized. Embryo:  Not Visualized. Cardiac Activity: Not Visualized. Subchorionic hemorrhage:  None visualized. Maternal uterus/adnexae: No free fluid is noted. Right ovary appears normal.  Possible corpus luteum cyst seen in left ovary. IMPRESSION: No intrauterine gestational sac, yolk sac, fetal pole, or cardiac activity visualized. Differential considerations include intrauterine gestation too early to be sonographically visualized, spontaneous abortion, or ectopic pregnancy. Consider follow-up ultrasound in 14 days and serial quantitative beta HCG follow-up. Electronically Signed   By: Marijo Conception M.D.   On: 04/26/2020 16:52  MAU COURSE Orders Placed This Encounter  Procedures  . US OB LESS THAN 14 WEEKS WITH OB TRANSVAGINAL  . Urinalysis, Routine w reflex microscopic Urine, Clean Catch  . CBC  . hCG, quantitative, pregnancy  . Diet NPO time specified  . Discharge patient   No orders of the defined types were placed in this encounter.   MDM +UPT UA, wet prep, GC/chlamydia, CBC, ABO/Rh, quant hCG, and [place] today to rule out ectopic pregnancy which can be life threatening.   RH positive No longer bleeding HCG today is 119  Ultrasound shows no IUP or adnexal masses. Suspect miscarriage based on symptoms & low HCG since positive test over a week ago, but can't exclude ectopic. Will get stat HCG in office on Friday  ASSESSMENT 1. Pregnancy of unknown anatomic location   2. Vaginal bleeding in pregnancy, first trimester     PLAN Discharge home in stable condition. Stat hcg scheduled at [facility] this Friday SAB vs ectopic precautions   Allergies as of 04/26/2020   No Known Allergies     Medication List    STOP taking these medications   ibuprofen 600 MG tablet Commonly known as: ADVIL   metroNIDAZOLE 500 MG tablet Commonly known as: Judithann Sheen, Junie Panning, NP 04/26/2020  5:08 PM

## 2020-04-29 ENCOUNTER — Ambulatory Visit: Payer: Medicaid Other

## 2020-05-05 ENCOUNTER — Ambulatory Visit: Payer: Medicaid Other | Admitting: Obstetrics and Gynecology

## 2020-05-24 ENCOUNTER — Ambulatory Visit: Payer: Medicaid Other | Admitting: Obstetrics and Gynecology

## 2020-06-09 ENCOUNTER — Telehealth: Payer: Self-pay | Admitting: Family Medicine

## 2020-06-09 MED ORDER — FLUCONAZOLE 150 MG PO TABS: 150.0000 mg | ORAL_TABLET | Freq: Once | ORAL | 0 refills | Status: AC

## 2020-06-09 NOTE — Telephone Encounter (Signed)
Patient sate she used the wrong laundry detergent and she feel that  She have an inf, and she feel very uncomfortable. would like something called in  [facility] on [address] in [place] .

## 2020-06-09 NOTE — Telephone Encounter (Signed)
Returned patients call. She reports she used Tide for washing clothing and now her vagina is irritated. She has itching and irritation. She has white discharge. Diflucan sent to pharmacy.   Patient reports she is not pregnant and has not followed up for serial Hcg or follow up US. She reports she missed her appt and has one scheduled for 11/10. Advised patient she needs to come to her appt.

## 2020-06-10 ENCOUNTER — Encounter: Payer: Self-pay | Admitting: Obstetrics & Gynecology

## 2020-06-10 ENCOUNTER — Other Ambulatory Visit: Payer: Self-pay

## 2020-06-10 ENCOUNTER — Ambulatory Visit (INDEPENDENT_AMBULATORY_CARE_PROVIDER_SITE_OTHER): Payer: Medicaid Other | Admitting: Obstetrics & Gynecology

## 2020-06-10 VITALS — BP 103/71 | HR 84 | Ht 67.0 in | Wt 131.0 lb

## 2020-06-10 DIAGNOSIS — N760 Acute vaginitis: Secondary | ICD-10-CM

## 2020-06-10 LAB — POCT PREGNANCY, URINE: Preg Test, Ur: NEGATIVE

## 2020-06-10 MED ORDER — VALACYCLOVIR HCL 1 G PO TABS: 1000.0000 mg | ORAL_TABLET | Freq: Every day | ORAL | 2 refills | Status: DC

## 2020-06-10 NOTE — Progress Notes (Signed)
Patient ID: Tricia Campbell, female   DOB: [birth year], 29 y.o.   MRN: [id number]  Chief Complaint  Patient presents with  . Vaginal Itching    HPI Tricia Campbell is a 29 y.o. female.  Acute onset of vaginal irritation and itching, has taken diflucan and sx are slightly better day 3.  HPI  Past Medical History:  Diagnosis Date  . Vaginal delivery     Past Surgical History:  Procedure Laterality Date  . APPENDECTOMY    . LAPAROSCOPIC APPENDECTOMY N/A 07/23/2018   Procedure: APPENDECTOMY LAPAROSCOPIC;  Surgeon: Alphonsa Overall, MD;  Location: [facility];  Service: General;  Laterality: N/A;  . WISDOM TOOTH EXTRACTION      Family History  Problem Relation Age of Onset  . Asthma Neg Hx   . Diabetes Neg Hx   . Heart disease Neg Hx   . Hypertension Neg Hx   . Kidney disease Neg Hx   . Stroke Neg Hx     Social History Social History   Tobacco Use  . Smoking status: Former Smoker    Packs/day: 0.20    Types: Cigars    Quit date: 12/05/2015    Years since quitting: 4.5  . Smokeless tobacco: Never Used  Substance Use Topics  . Alcohol use: No  . Drug use: No    No Known Allergies  Current Outpatient Medications  Medication Sig Dispense Refill  . valACYclovir (VALTREX) 1000 MG tablet Take 1 tablet (1,000 mg total) by mouth daily. 5 tablet 2   No current facility-administered medications for this visit.    Review of Systems Review of Systems  Constitutional: Negative.   Gastrointestinal: Negative.   Genitourinary: Positive for vaginal pain. Negative for vaginal bleeding and vaginal discharge.    Blood pressure 103/71, pulse 84, height 5\' 7"  (1.702 m), weight 131 lb (59.4 kg), last menstrual period 04/18/2020, not currently breastfeeding.  Physical Exam Physical Exam Constitutional:      Appearance: Normal appearance.  Genitourinary:    Comments: Mild erythema and minimal edema, excoriated lesions left side of introitu, viral PCR test obtained for  HSV Musculoskeletal:        General: Normal range of motion.  Neurological:     Mental Status: She is alert.  Psychiatric:        Mood and Affect: Mood normal.        Behavior: Behavior normal.     Data Reviewed Previous labs for STD reviewed, no test for HSV on record  Assessment Vulvovaginitis f/o HSV outbreak but suspicious, test pending  Plan Orders Placed This Encounter  Procedures  . Herpes simplex virus(hsv) dna by pcr  . Pregnancy, urine POC   Meds ordered this encounter  Medications  . DISCONTD: valACYclovir (VALTREX) 1000 MG tablet    Sig: Take 1 tablet (1,000 mg total) by mouth daily.    Dispense:  5 tablet    Refill:  2  . valACYclovir (VALTREX) 1000 MG tablet    Sig: Take 1 tablet (1,000 mg total) by mouth daily.    Dispense:  5 tablet    Refill:  2       Emeterio Reeve 06/10/2020, 10:58 AM

## 2020-06-10 NOTE — Patient Instructions (Signed)

## 2020-06-15 ENCOUNTER — Ambulatory Visit: Payer: Medicaid Other | Admitting: Family Medicine

## 2020-06-15 LAB — HSV DNA BY PCR (REFERENCE LAB)
HSV 2 DNA: NEGATIVE
HSV-1 DNA: NEGATIVE

## 2020-07-08 ENCOUNTER — Inpatient Hospital Stay (HOSPITAL_COMMUNITY)
Admission: AD | Admit: 2020-07-08 | Discharge: 2020-07-08 | Disposition: A | Discharge status: Home / Self Care | Payer: Medicaid Other | Attending: Obstetrics & Gynecology | Admitting: Obstetrics & Gynecology

## 2020-07-08 ENCOUNTER — Encounter (HOSPITAL_COMMUNITY): Payer: Self-pay | Admitting: Obstetrics & Gynecology

## 2020-07-08 ENCOUNTER — Other Ambulatory Visit: Payer: Self-pay

## 2020-07-08 DIAGNOSIS — Z87891 Personal history of nicotine dependence: Secondary | ICD-10-CM | POA: Insufficient documentation

## 2020-07-08 DIAGNOSIS — O219 Vomiting of pregnancy, unspecified: Secondary | ICD-10-CM

## 2020-07-08 DIAGNOSIS — Z3687 Encounter for antenatal screening for uncertain dates: Secondary | ICD-10-CM

## 2020-07-08 LAB — URINALYSIS, ROUTINE W REFLEX MICROSCOPIC
Bilirubin Urine: NEGATIVE
Glucose, UA: NEGATIVE mg/dL
Hgb urine dipstick: NEGATIVE
Ketones, ur: 80 mg/dL — AB
Leukocytes,Ua: NEGATIVE
Nitrite: NEGATIVE
Protein, ur: NEGATIVE mg/dL
Specific Gravity, Urine: 1.021 (ref 1.005–1.030)
pH: 6 (ref 5.0–8.0)

## 2020-07-08 LAB — POCT PREGNANCY, URINE: Preg Test, Ur: POSITIVE — AB

## 2020-07-08 MED ORDER — METOCLOPRAMIDE HCL 10 MG PO TABS: 10.0000 mg | ORAL_TABLET | Freq: Three times a day (TID) | ORAL | 2 refills | Status: DC | PRN

## 2020-07-08 MED ORDER — PROMETHAZINE HCL 25 MG PO TABS
12.5000 mg | ORAL_TABLET | Freq: Every evening | ORAL | 2 refills | Status: DC | PRN
Start: 2020-07-08 — End: 2020-08-16

## 2020-07-08 MED ORDER — FAMOTIDINE IN NACL 20-0.9 MG/50ML-% IV SOLN
20.0000 mg | Freq: Once | INTRAVENOUS | Status: AC
  Administered 2020-07-08: 20 mg via INTRAVENOUS
  Filled 2020-07-08: qty 50

## 2020-07-08 MED ORDER — LACTATED RINGERS IV BOLUS
1000.0000 mL | Freq: Once | INTRAVENOUS | Status: AC
  Administered 2020-07-08: 1000 mL via INTRAVENOUS

## 2020-07-08 MED ORDER — METOCLOPRAMIDE HCL 5 MG/ML IJ SOLN
10.0000 mg | Freq: Once | INTRAMUSCULAR | Status: AC
  Administered 2020-07-08: 10 mg via INTRAVENOUS
  Filled 2020-07-08: qty 2

## 2020-07-08 NOTE — MAU Note (Signed)
Feeling very pregnant.  Nauseous, has a HA- hasn't eaten in days, dizzy, extra sleepy- very tired but can't sleep.Last period was 9/16- was seen then for annual, preg test was + HCG level was 119; nothing seen on [place].. pt believes it was a regular period, bled for 7 days. Was seen 11/5 preg test was neg. +HPT 2 days ago.

## 2020-07-08 NOTE — MAU Provider Note (Signed)
Chief Complaint: Possible Pregnancy, Nausea, Dizziness, and Fatigue   None     SUBJECTIVE HPI: Tricia Campbell is a 29 y.o. [id number] at who presents to maternity admissions reporting n/v, dizziness and fatigue. She had positive pregnancy tests in September but a negative test on 11/5.  She started having pregnancy symptoms and had a positive HPT 2 days ago.  She denies any abdominal pain or vaginal bleeding. She has not tried any treatments for nausea. She has a 27 year old and is driving today so wants medications for the daytime that are nondrowsy.     HPI  Past Medical History:  Diagnosis Date  . Vaginal delivery    Past Surgical History:  Procedure Laterality Date  . APPENDECTOMY    . LAPAROSCOPIC APPENDECTOMY N/A 07/23/2018   Procedure: APPENDECTOMY LAPAROSCOPIC;  Surgeon: Alphonsa Overall, MD;  Location: [facility];  Service: General;  Laterality: N/A;  . WISDOM TOOTH EXTRACTION     Social History   Socioeconomic History  . Marital status: Single    Spouse name: Not on file  . Number of children: Not on file  . Years of education: Not on file  . Highest education level: Not on file  Occupational History  . Not on file  Tobacco Use  . Smoking status: Former Smoker    Packs/day: 0.20    Types: Cigars    Quit date: 12/05/2015    Years since quitting: 4.5  . Smokeless tobacco: Never Used  Substance and Sexual Activity  . Alcohol use: No  . Drug use: No  . Sexual activity: Yes  Other Topics Concern  . Not on file  Social History Narrative  . Not on file   Social Determinants of Health   Financial Resource Strain:   . Difficulty of Paying Living Expenses: Not on file  Food Insecurity:   . Worried About Charity fundraiser in the Last Year: Not on file  . Ran Out of Food in the Last Year: Not on file  Transportation Needs:   . Lack of Transportation (Medical): Not on file  . Lack of Transportation (Non-Medical): Not on file  Physical Activity:   . Days of  Exercise per Week: Not on file  . Minutes of Exercise per Session: Not on file  Stress:   . Feeling of Stress : Not on file  Social Connections:   . Frequency of Communication with Friends and Family: Not on file  . Frequency of Social Gatherings with Friends and Family: Not on file  . Attends Religious Services: Not on file  . Active Member of Clubs or Organizations: Not on file  . Attends Archivist Meetings: Not on file  . Marital Status: Not on file  Intimate Partner Violence:   . Fear of Current or Ex-Partner: Not on file  . Emotionally Abused: Not on file  . Physically Abused: Not on file  . Sexually Abused: Not on file   No current facility-administered medications on file prior to encounter.   Current Outpatient Medications on File Prior to Encounter  Medication Sig Dispense Refill  . valACYclovir (VALTREX) 1000 MG tablet Take 1 tablet (1,000 mg total) by mouth daily. 5 tablet 2   Allergies  Allergen Reactions  . Latex Rash    ROS:  Review of Systems  Constitutional: Negative for chills, fatigue and fever.  Respiratory: Negative for shortness of breath.   Cardiovascular: Negative for chest pain.  Gastrointestinal: Positive for nausea and vomiting.  Genitourinary: Negative  for difficulty urinating, dysuria, flank pain, pelvic pain, vaginal bleeding, vaginal discharge and vaginal pain.  Neurological: Positive for dizziness. Negative for headaches.  Psychiatric/Behavioral: Negative.      I have reviewed patient's Past Medical Hx, Surgical Hx, Family Hx, Social Hx, medications and allergies.   Physical Exam   Patient Vitals for the past 24 hrs:  BP Temp Temp src Pulse Resp SpO2 Height Weight  07/08/20 1650 112/60 -- -- 95 -- -- -- --  07/08/20 1615 108/70 98 F (36.7 C) Oral 87 16 100 % 5\' 6"  (1.676 m) 57.7 kg   Constitutional: Well-developed, well-nourished female in no acute distress.  Cardiovascular: normal rate Respiratory: normal effort GI: Abd  soft, non-tender. Pos BS x 4 MS: Extremities nontender, no edema, normal ROM Neurologic: Alert and oriented x 4.  GU: Neg CVAT.  PELVIC EXAM: Deferred  LAB RESULTS Results for orders placed or performed during the hospital encounter of 07/08/20 (from the past 24 hour(s))  Urinalysis, Routine w reflex microscopic Urine, Clean Catch     Status: Abnormal   Collection Time: 07/08/20  4:20 PM  Result Value Ref Range   Color, Urine YELLOW YELLOW   APPearance HAZY (A) CLEAR   Specific Gravity, Urine 1.021 1.005 - 1.030   pH 6.0 5.0 - 8.0   Glucose, UA NEGATIVE NEGATIVE mg/dL   Hgb urine dipstick NEGATIVE NEGATIVE   Bilirubin Urine NEGATIVE NEGATIVE   Ketones, ur 80 (A) NEGATIVE mg/dL   Protein, ur NEGATIVE NEGATIVE mg/dL   Nitrite NEGATIVE NEGATIVE   Leukocytes,Ua NEGATIVE NEGATIVE  Pregnancy, urine POC     Status: Abnormal   Collection Time: 07/08/20  4:24 PM  Result Value Ref Range   Preg Test, Ur POSITIVE (A) NEGATIVE       IMAGING No results found.  MAU Management/MDM: Orders Placed This Encounter  Procedures  . US OB Transvaginal  . Urinalysis, Routine w reflex microscopic Urine, Clean Catch  . Pregnancy, urine POC  . Discharge patient    Meds ordered this encounter  Medications  . metoCLOPramide (REGLAN) injection 10 mg  . famotidine (PEPCID) IVPB 20 mg premix  . lactated ringers bolus 1,000 mL  . metoCLOPramide (REGLAN) 10 MG tablet    Sig: Take 1 tablet (10 mg total) by mouth 3 (three) times daily with meals as needed for nausea.    Dispense:  30 tablet    Refill:  2    Order Specific Question:   Supervising Provider    Answer:   Woodroe Mode [[id number]]  . promethazine (PHENERGAN) 25 MG tablet    Sig: Take 0.5-1 tablets (12.5-25 mg total) by mouth at bedtime as needed for nausea. Or place medication vaginally if you cannot tolerate them by mouth.    Dispense:  30 tablet    Refill:  2    Order Specific Question:   Supervising Provider    Answer:   [place] Mode [[id number]]    Pt nausea and dizziness improved after IV fluids and medications.  Outpatient [place] for dating ordered. Rx for Reglan and Phenergan.  F/U at [facility] for prenatal care.  Pt discharged with strict return precautions.  ASSESSMENT 1. Nausea and vomiting during pregnancy prior to [redacted] weeks gestation   2. Unsure of LMP (last menstrual period) as reason for ultrasound scan     PLAN Discharge home Allergies as of 07/08/2020      Reactions   Latex Rash      Medication List  STOP taking these medications   valACYclovir 1000 MG tablet Commonly known as: VALTREX     TAKE these medications   metoCLOPramide 10 MG tablet Commonly known as: REGLAN Take 1 tablet (10 mg total) by mouth 3 (three) times daily with meals as needed for nausea.   promethazine 25 MG tablet Commonly known as: PHENERGAN Take 0.5-1 tablets (12.5-25 mg total) by mouth at bedtime as needed for nausea. Or place medication vaginally if you cannot tolerate them by mouth.        Fatima Blank Certified Nurse-Midwife 07/08/2020  5:51 PM

## 2020-07-21 ENCOUNTER — Ambulatory Visit: Admission: RE | Admit: 2020-07-21 | Payer: Medicaid Other | Source: Ambulatory Visit

## 2020-08-11 ENCOUNTER — Ambulatory Visit (INDEPENDENT_AMBULATORY_CARE_PROVIDER_SITE_OTHER): Payer: Medicaid Other | Admitting: Student

## 2020-08-11 ENCOUNTER — Other Ambulatory Visit: Payer: Self-pay

## 2020-08-11 VITALS — BP 115/61 | HR 61 | Ht 66.0 in | Wt 128.0 lb

## 2020-08-11 DIAGNOSIS — O034 Incomplete spontaneous abortion without complication: Secondary | ICD-10-CM

## 2020-08-11 DIAGNOSIS — R102 Pelvic and perineal pain: Secondary | ICD-10-CM

## 2020-08-11 LAB — POCT URINALYSIS DIP (DEVICE)
Bilirubin Urine: NEGATIVE
Glucose, UA: NEGATIVE mg/dL
Ketones, ur: NEGATIVE mg/dL
Nitrite: NEGATIVE
Protein, ur: NEGATIVE mg/dL
Specific Gravity, Urine: >=1.03 (ref 1.005–1.030)
Urobilinogen, UA: 0.2 mg/dL (ref 0.0–1.0)
pH: 6 (ref 5.0–8.0)

## 2020-08-11 LAB — POCT PREGNANCY, URINE: Preg Test, Ur: POSITIVE — AB

## 2020-08-11 NOTE — Progress Notes (Signed)
History:  Tricia Campbell is a 30 y.o. [phone] who presents to clinic today for complaints of pain with urination, frequency and constant bleeding since her medication abortion on 07/18/2020. She is upset and wants to know why her vagina "just hurts". She reports a large clot (showed picture on phone of large clot on towel). She denies fever, abdominal tenderness, foul-smelling discharge. She had her termination in [place] and does not have follow-up scheduled there until later in the month. She is requesting appointment for birth control as well. She has not had intercourse since her termination and she says she has already been tested for STIs and was negative.  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  The following portions of the patient's history were reviewed and updated as appropriate: allergies, current medications, family history, past medical history, social history, past surgical history and problem list.  Review of Systems:  Review of Systems  Constitutional: Negative.   HENT: Negative.   Cardiovascular: Negative.   Genitourinary: Negative.   Musculoskeletal: Negative.   Skin: Negative.   Neurological: Negative.       Objective:  Physical Exam BP 115/61   Pulse 61   Ht 5\' 6"  (1.676 m)   Wt 128 lb (58.1 kg)   LMP 04/18/2020 Comment: lmp listed was probable SAB, had + test at that time, neg test 11/5  BMI 20.66 kg/m  Physical Exam Constitutional:      Appearance: Normal appearance.  HENT:     Head: Normocephalic.  Genitourinary:    General: Normal vulva.     Vagina: Vaginal discharge present.     Comments: NEFG; no lesions on vulva, labia, cervix or vaginal walls. Light pink blood in the vagina, no odor, no abdominal tenderness, no CMT>  Neurological:     Mental Status: She is alert.       Labs and Imaging Results for orders placed or performed in visit on 08/11/20 (from the past 24 hour(s))  POCT urinalysis dip (device)      Status: Abnormal   Collection Time: 08/11/20  4:32 PM  Result Value Ref Range   Glucose, UA NEGATIVE NEGATIVE mg/dL   Bilirubin Urine NEGATIVE NEGATIVE   Ketones, ur NEGATIVE NEGATIVE mg/dL   Specific Gravity, Urine >=1.030 1.005 - 1.030   Hgb urine dipstick MODERATE (A) NEGATIVE   pH 6.0 5.0 - 8.0   Protein, ur NEGATIVE NEGATIVE mg/dL   Urobilinogen, UA 0.2 0.0 - 1.0 mg/dL   Nitrite NEGATIVE NEGATIVE   Leukocytes,Ua SMALL (A) NEGATIVE  Pregnancy, urine POC     Status: Abnormal   Collection Time: 08/11/20  4:36 PM  Result Value Ref Range   Preg Test, Ur POSITIVE (A) NEGATIVE    No results found.   Assessment & Plan:  1. Retained products of conception following abortion -Concern for retained POC due to constant bleeding, however, low index of suspicion for infection as patient is non-tender, no fever, no foul-smellling discharge.   - 10/09/20 PELVIC COMPLETE WITH TRANSVAGINAL; Future  2. Vaginal pain -patient has previously scheduled annual appointment on Tuesday; will try to get patient in for Wednesday before that and then review results with provider on Tuesday.  -UA is negative for signs of infection, not clear that patient has UTI. Urine not sent for culture.  -no sign of HSV outbreak  Approximately 15 minutes of total time was spent with this patient on exam and plan.   Friday, CNM 08/11/2020  5:03 PM

## 2020-08-11 NOTE — Progress Notes (Signed)
Patient reports some pain with urination & frequency for the past couple of days. Patient states she hasn't been sexually active lately so she knows it's not an STD. She reports a lot of vaginal pain and just feel uncomfortable constantly. Patient states she took abortion pills on 12/13 and has been bleeding since then. She showed pictures of passing two large clots on 12/28 and requests upt today. upt +. Discussed with Luna Kitchens who will see patient for an exam.

## 2020-08-15 ENCOUNTER — Ambulatory Visit (HOSPITAL_COMMUNITY)
Admission: RE | Admit: 2020-08-15 | Discharge: 2020-08-15 | Disposition: A | Discharge status: Home / Self Care | Payer: Medicaid Other | Source: Ambulatory Visit | Attending: Student | Admitting: Student

## 2020-08-15 ENCOUNTER — Other Ambulatory Visit: Payer: Self-pay

## 2020-08-15 DIAGNOSIS — O034 Incomplete spontaneous abortion without complication: Secondary | ICD-10-CM | POA: Diagnosis present

## 2020-08-16 ENCOUNTER — Encounter: Payer: Self-pay | Admitting: Advanced Practice Midwife

## 2020-08-16 ENCOUNTER — Ambulatory Visit (INDEPENDENT_AMBULATORY_CARE_PROVIDER_SITE_OTHER): Payer: Medicaid Other | Admitting: Advanced Practice Midwife

## 2020-08-16 VITALS — BP 101/62 | HR 93 | Wt 133.1 lb

## 2020-08-16 DIAGNOSIS — O034 Incomplete spontaneous abortion without complication: Secondary | ICD-10-CM | POA: Insufficient documentation

## 2020-08-16 MED ORDER — HYDROCODONE-ACETAMINOPHEN 5-325 MG PO TABS
1.0000 | ORAL_TABLET | ORAL | 0 refills | Status: DC | PRN
Start: 2020-08-16 — End: 2020-09-07

## 2020-08-16 MED ORDER — MISOPROSTOL 200 MCG PO TABS: ORAL_TABLET | ORAL | 1 refills | Status: DC

## 2020-08-16 MED ORDER — PROMETHAZINE HCL 25 MG PO TABS
12.5000 mg | ORAL_TABLET | Freq: Every evening | ORAL | 2 refills | Status: DC | PRN
Start: 2020-08-16 — End: 2020-09-07

## 2020-08-16 MED ORDER — IBUPROFEN 600 MG PO TABS: 600.0000 mg | ORAL_TABLET | Freq: Four times a day (QID) | ORAL | 3 refills | Status: DC | PRN

## 2020-08-16 NOTE — Progress Notes (Signed)
OB/GYNECOLOGY PROBLEM VISIT NOTE  History:     Tricia Campbell is a 30 y.o. [id number] female here follow-up to continuous vaginal bleeding in the setting of recent elective termination of pregnancy. Current complaints: daily bleeding since her termination on 07/18/2020.   Denies fever, abdominal tenderness, discharge, pelvic pain, problems with intercourse or other gynecologic concerns.    Gynecologic History Patient's last menstrual period was 04/18/2020 (lmp unknown). Contraception: none Last Pap: 03/2017. Results were: normal with negative HPV   Obstetric History OB History  Gravida Para Term Preterm AB Living  3 1 1  0 2 1  SAB IAB Ectopic Multiple Live Births  0 2 0 0 1    # Outcome Date GA Lbr Len/2nd Weight Sex Delivery Anes PTL Lv  3 IAB 2021          2 IAB 2020          1 Term 02/25/17 [redacted]w[redacted]d 04:55 / 00:12 7 lb 7.1 oz (3.375 kg) M Vag-Spont None  LIV    Past Medical History:  Diagnosis Date  . Vaginal delivery     Past Surgical History:  Procedure Laterality Date  . APPENDECTOMY    . LAPAROSCOPIC APPENDECTOMY N/A 07/23/2018   Procedure: APPENDECTOMY LAPAROSCOPIC;  Surgeon: Alphonsa Overall, MD;  Location: [facility];  Service: General;  Laterality: N/A;  . WISDOM TOOTH EXTRACTION      Current Outpatient Medications on File Prior to Visit  Medication Sig Dispense Refill  . SPRINTEC 28 0.25-35 MG-MCG tablet Take 1 tablet by mouth daily.    . metoCLOPramide (REGLAN) 10 MG tablet Take 1 tablet (10 mg total) by mouth 3 (three) times daily with meals as needed for nausea. (Patient not taking: Reported on 08/16/2020) 30 tablet 2   No current facility-administered medications on file prior to visit.    Allergies  Allergen Reactions  . Latex Rash    Social History:  reports that she quit smoking about 4 years ago. Her smoking use included cigars. She smoked 0.20 packs per day. She has never used smokeless tobacco. She reports that she does not drink alcohol and  does not use drugs.  Family History  Problem Relation Age of Onset  . Asthma Neg Hx   . Diabetes Neg Hx   . Heart disease Neg Hx   . Hypertension Neg Hx   . Kidney disease Neg Hx   . Stroke Neg Hx     The following portions of the patient's history were reviewed and updated as appropriate: allergies, current medications, past family history, past medical history, past social history, past surgical history and problem list.  Review of Systems Pertinent items noted in HPI and remainder of comprehensive ROS otherwise negative.  Physical Exam:  BP 101/62   Pulse 93   Wt 133 lb 1.6 oz (60.4 kg)   LMP 04/18/2020 (LMP Unknown) Comment: lmp listed was probable SAB, had + test at that time, neg test 11/5  Breastfeeding No   BMI 21.48 kg/m  CONSTITUTIONAL: Well-developed, well-nourished female in no acute distress.  HENT:  Normocephalic, atraumatic, External right and left ear normal.  EYES: Conjunctivae and EOM are normal. Pupils are equal, round, and reactive to light. No scleral icterus.  NECK: Normal range of motion, supple, no masses.  Normal thyroid.  SKIN: Skin is warm and dry. No rash noted. Not diaphoretic. No erythema. No pallor. MUSCULOSKELETAL: Normal range of motion. No tenderness.  No cyanosis, clubbing, or edema.   NEUROLOGIC: Alert and oriented  to person, place, and time. Normal reflexes, muscle tone coordination.  PSYCHIATRIC: Normal mood and affect. Normal behavior. Normal judgment and thought content. CARDIOVASCULAR: Normal heart rate noted, regular rhythm RESPIRATORY: Clear to auscultation bilaterally. Effort and breath sounds normal, no problems with respiration noted. ABDOMEN: Soft, no distention noted.  No tenderness, rebound or guarding.     Assessment and Plan:    1. Retained products of conception following abortion - Confirmed via [facility] yesterday - Cytotec, phenergan, pain medicine sent to pharmacy - Patient to return to office in about one week for  evaluation of bleeding s/p Cytotec - Beta hCG quant (ref lab)  Pap due, contraception counseling desired. Pt to schedule once Quant hCG <5 Routine preventative health maintenance measures emphasized. Please refer to After Visit Summary for other counseling recommendations.      Mallie Snooks, MSN, CNM Certified Nurse Midwife, [facility] for [employer], [place] 08/16/20 12:52 PM

## 2020-08-16 NOTE — Patient Instructions (Signed)
Preventive Care 21-28 Years Old, Female Preventive care refers to lifestyle choices and visits with your health care provider that can promote health and wellness. This includes:  A yearly physical exam. This is also called an annual wellness visit.  Regular dental and eye exams.  Immunizations.  Screening for certain conditions.  Healthy lifestyle choices, such as: ? Eating a healthy diet. ? Getting regular exercise. ? Not using drugs or products that contain nicotine and tobacco. ? Limiting alcohol use. What can I expect for my preventive care visit? Physical exam Your health care provider may check your:  Height and weight. These may be used to calculate your BMI (body mass index). BMI is a measurement that tells if you are at a healthy weight.  Heart rate and blood pressure.  Body temperature.  Skin for abnormal spots. Counseling Your health care provider may ask you questions about your:  Past medical problems.  Family's medical history.  Alcohol, tobacco, and drug use.  Emotional well-being.  Home life and relationship well-being.  Sexual activity.  Diet, exercise, and sleep habits.  Work and work environment.  Access to firearms.  Method of birth control.  Menstrual cycle.  Pregnancy history. What immunizations do I need? Vaccines are usually given at various ages, according to a schedule. Your health care provider will recommend vaccines for you based on your age, medical history, and lifestyle or other factors, such as travel or where you work.   What tests do I need? Blood tests  Lipid and cholesterol levels. These may be checked every 5 years starting at age 20.  Hepatitis C test.  Hepatitis B test. Screening  Diabetes screening. This is done by checking your blood sugar (glucose) after you have not eaten for a while (fasting).  STD (sexually transmitted disease) testing, if you are at risk.  BRCA-related cancer screening. This may be  done if you have a family history of breast, ovarian, tubal, or peritoneal cancers.  Pelvic exam and Pap test. This may be done every 3 years starting at age 21. Starting at age 30, this may be done every 5 years if you have a Pap test in combination with an HPV test. Talk with your health care provider about your test results, treatment options, and if necessary, the need for more tests.   Follow these instructions at home: Eating and drinking  Eat a healthy diet that includes fresh fruits and vegetables, whole grains, lean protein, and low-fat dairy products.  Take vitamin and mineral supplements as recommended by your health care provider.  Do not drink alcohol if: ? Your health care provider tells you not to drink. ? You are pregnant, may be pregnant, or are planning to become pregnant.  If you drink alcohol: ? Limit how much you have to 0-1 drink a day. ? Be aware of how much alcohol is in your drink. In the [place] one drink equals one 12 oz bottle of beer (355 mL), one 5 oz glass of wine (148 mL), or one 1 oz glass of hard liquor (44 mL).   Lifestyle  Take daily care of your teeth and gums. Brush your teeth every morning and night with fluoride toothpaste. Floss one time each day.  Stay active. Exercise for at least 30 minutes 5 or more days each week.  Do not use any products that contain nicotine or tobacco, such as cigarettes, e-cigarettes, and chewing tobacco. If you need help quitting, ask your health care provider.  Do not   use drugs.  If you are sexually active, practice safe sex. Use a condom or other form of protection to prevent STIs (sexually transmitted infections).  If you do not wish to become pregnant, use a form of birth control. If you plan to become pregnant, see your health care provider for a prepregnancy visit.  Find healthy ways to cope with stress, such as: ? Meditation, yoga, or listening to music. ? Journaling. ? Talking to a trusted  person. ? Spending time with friends and family. Safety  Always wear your seat belt while driving or riding in a vehicle.  Do not drive: ? If you have been drinking alcohol. Do not ride with someone who has been drinking. ? When you are tired or distracted. ? While texting.  Wear a helmet and other protective equipment during sports activities.  If you have firearms in your house, make sure you follow all gun safety procedures.  Seek help if you have been physically or sexually abused. What's next?  Go to your health care provider once a year for an annual wellness visit.  Ask your health care provider how often you should have your eyes and teeth checked.  Stay up to date on all vaccines. This information is not intended to replace advice given to you by your health care provider. Make sure you discuss any questions you have with your health care provider. Document Revised: 03/20/2020 Document Reviewed: 04/03/2018 Elsevier Patient Education  2021 Elsevier Inc.  

## 2020-08-17 LAB — BETA HCG QUANT (REF LAB): hCG Quant: 79 m[IU]/mL

## 2020-08-18 ENCOUNTER — Telehealth: Payer: Self-pay | Admitting: Family Medicine

## 2020-08-18 NOTE — Telephone Encounter (Signed)
Returned patients call. She reports that she has questions about medications that were prescribed.   She reports she picked up the pills and wants to know what to do with each one.   She placed the Cytotec in her mouth while on the phone, Reviewed that she would wait for them to dissolve and not swallow them.   Reviewed phenergan is for nausea as needed, Ibuprofen for mild to moderate pain every 6 hours and Oxycodone as needed for sever pain.   Reviewed with patient that the Cytotec usually works within about 24 hours. If she is having difficulty or pills not working, please reach back out to the office. Reviewed office closes at 12 n tomorrow and to go to [facility] for severe bleeding or pain that is not relieved by medications she was given. Patient reports understanding.

## 2020-08-18 NOTE — Telephone Encounter (Signed)
Patient state she don't understand the way she should take her four medications. Patient is requesting a call back ASAP

## 2020-08-24 ENCOUNTER — Ambulatory Visit (INDEPENDENT_AMBULATORY_CARE_PROVIDER_SITE_OTHER): Payer: Medicaid Other | Admitting: Obstetrics & Gynecology

## 2020-08-24 ENCOUNTER — Encounter: Payer: Self-pay | Admitting: Obstetrics & Gynecology

## 2020-08-24 ENCOUNTER — Other Ambulatory Visit: Payer: Self-pay

## 2020-08-24 VITALS — BP 114/64 | HR 82 | Ht 66.0 in | Wt 132.0 lb

## 2020-08-24 DIAGNOSIS — Z113 Encounter for screening for infections with a predominantly sexual mode of transmission: Secondary | ICD-10-CM

## 2020-08-24 DIAGNOSIS — Z3042 Encounter for surveillance of injectable contraceptive: Secondary | ICD-10-CM

## 2020-08-24 DIAGNOSIS — O034 Incomplete spontaneous abortion without complication: Secondary | ICD-10-CM

## 2020-08-24 MED ORDER — MEDROXYPROGESTERONE ACETATE 150 MG/ML IM SUSP
150.0000 mg | Freq: Once | INTRAMUSCULAR | Status: AC
  Administered 2020-08-24: 150 mg via INTRAMUSCULAR

## 2020-08-24 NOTE — Patient Instructions (Signed)
Medroxyprogesterone injection [Contraceptive] What is this medicine? MEDROXYPROGESTERONE (me DROX ee proe JES te rone) contraceptive injections prevent pregnancy. They provide effective birth control for 3 months. Depo-SubQ Provera 104 injection is also used for treating pain related to endometriosis. This medicine may be used for other purposes; ask your health care provider or pharmacist if you have questions. COMMON BRAND NAME(S): Depo-Provera, Depo-subQ Provera 104 What should I tell my health care provider before I take this medicine? They need to know if you have any of these conditions:  asthma  blood clots  breast cancer or family history of breast cancer  depression  diabetes  eating disorder (anorexia nervosa)  heart attack  high blood pressure  HIV infection or AIDS  if you often drink alcohol  kidney disease  liver disease  migraine headaches  osteoporosis, weak bones  seizures  stroke  tobacco smoker  vaginal bleeding  an unusual or allergic reaction to medroxyprogesterone, other hormones, medicines, foods, dyes, or preservatives  pregnant or trying to get pregnant  breast-feeding How should I use this medicine? Depo-Provera CI contraceptive injection is given into a muscle. Depo-subQ Provera 104 injection is given under the skin. It is given by a health care provider in a hospital or clinic setting. The injection is usually given during the first 5 days after the start of a menstrual period or 6 weeks after delivery of a baby. A patient package insert for the product will be given with each prescription and refill. Be sure to read this information carefully each time. The sheet may change often. Talk to your pediatrician regarding the use of this medicine in children. Special care may be needed. These injections have been used in female children who have started having menstrual periods. Overdosage: If you think you have taken too much of this  medicine contact a poison control center or emergency room at once. NOTE: This medicine is only for you. Do not share this medicine with others. What if I miss a dose? Keep appointments for follow-up doses. You must get an injection once every 3 months. It is important not to miss your dose. Call your health care provider if you are unable to keep an appointment. What may interact with this medicine?  antibiotics or medicines for infections, especially rifampin and griseofulvin  antivirals for HIV or hepatitis  aprepitant  armodafinil  bexarotene  bosentan  medicines for seizures like carbamazepine, felbamate, oxcarbazepine, phenytoin, phenobarbital, primidone, topiramate  mitotane  modafinil  St. John's wort This list may not describe all possible interactions. Give your health care provider a list of all the medicines, herbs, non-prescription drugs, or dietary supplements you use. Also tell them if you smoke, drink alcohol, or use illegal drugs. Some items may interact with your medicine. What should I watch for while using this medicine? This drug does not protect you against HIV infection (AIDS) or other sexually transmitted diseases. Use of this product may cause you to lose calcium from your bones. Loss of calcium may cause weak bones (osteoporosis). Only use this product for more than 2 years if other forms of birth control are not right for you. The longer you use this product for birth control the more likely you will be at risk for weak bones. Ask your health care professional how you can keep strong bones. You may have a change in bleeding pattern or irregular periods. Many females stop having periods while taking this drug. If you have received your injections on time, your   chance of being pregnant is very low. If you think you may be pregnant, see your health care professional as soon as possible. Tell your health care professional if you want to get pregnant within the  next year. The effect of this medicine may last a long time after you get your last injection. What side effects may I notice from receiving this medicine? Side effects that you should report to your doctor or health care professional as soon as possible:  allergic reactions like skin rash, itching or hives, swelling of the face, lips, or tongue  blood clot (chest pain; shortness of breath; pain, swelling, or warmth in the leg)  breast tenderness or discharge  changes in emotions or moods  changes in vision  liver injury (dark yellow or brown urine; general ill feeling or flu-like symptoms; loss of appetite, right upper belly pain; unusually weak or tired, yellowing of the eyes or skin)  persistent pain, pus, or bleeding at the injection site  stroke (changes in vision; confusion; trouble speaking or understanding; severe headaches; sudden numbness or weakness of the face, arm or leg; trouble walking; dizziness; loss of balance or coordination)  trouble breathing Side effects that usually do not require medical attention (report to your doctor or health care professional if they continue or are bothersome):  change in sex drive  dizziness  fluid retention  headache  irregular periods, spotting, or absent periods  pain, redness, or irritation at site where injected  stomach pain  weight gain This list may not describe all possible side effects. Call your doctor for medical advice about side effects. You may report side effects to FDA at 1-800-FDA-1088. Where should I keep my medicine? This injection is only given by a health care provider. It will not be stored at home. NOTE: This sheet is a summary. It may not cover all possible information. If you have questions about this medicine, talk to your doctor, pharmacist, or health care provider.  2021 Elsevier/Gold Standard (2019-09-09 10:29:21)  

## 2020-08-24 NOTE — Progress Notes (Signed)
GYNECOLOGY OFFICE VISIT NOTE  History:   Tricia Campbell is a 30 y.o. [id number] here today for follow up for retained products of conception (RPOCs). Took misoprostol as prescribed, reported having heavy bleeding afterwards. No lightheadedness, no other symptoms. Reports mild bleeding currently.  She denies any abnormal vaginal discharge, pelvic pain or other concerns.    Past Medical History:  Diagnosis Date  . Vaginal delivery     Past Surgical History:  Procedure Laterality Date  . APPENDECTOMY    . LAPAROSCOPIC APPENDECTOMY N/A 07/23/2018   Procedure: APPENDECTOMY LAPAROSCOPIC;  Surgeon: Alphonsa Overall, MD;  Location: [facility];  Service: General;  Laterality: N/A;  . WISDOM TOOTH EXTRACTION      The following portions of the patient's history were reviewed and updated as appropriate: allergies, current medications, past family history, past medical history, past social history, past surgical history and problem list.   Health Maintenance:  Normal pap on 04/02/2017   Review of Systems:  Pertinent items noted in HPI and remainder of comprehensive ROS otherwise negative.  Physical Exam:  BP 114/64   Pulse 82   Ht 5\' 6"  (1.676 m)   Wt 132 lb (59.9 kg)   LMP 04/18/2020 (LMP Unknown) Comment: lmp listed was probable SAB, had + test at that time, neg test 11/5  BMI 21.31 kg/m  CONSTITUTIONAL: Well-developed, well-nourished female in no acute distress.  SKIN: No rash noted. Not diaphoretic. No erythema. No pallor. MUSCULOSKELETAL: Normal range of motion. No edema noted. NEUROLOGIC: Alert and oriented to person, place, and time. Normal muscle tone coordination. No cranial nerve deficit noted. PSYCHIATRIC: Normal mood and affect. Normal behavior. Normal judgment and thought content. CARDIOVASCULAR: Normal heart rate noted RESPIRATORY: Effort and breath sounds normal, no problems with respiration noted ABDOMEN: No masses noted. No other overt distention noted.   PELVIC:  Deferred  Labs and Imaging Lab Results  Component Value Date   HCGQUANT 79 08/16/2020   HCGBETAQNT 119 (H) 04/26/2020   US PELVIC COMPLETE WITH TRANSVAGINAL  Result Date: 08/15/2020 CLINICAL DATA:  Status post abortion on 07/21/2020. Patient reports passing clot on 08/02/20. positive pregnancy test on 08/11/2020. EXAM: TRANSABDOMINAL AND TRANSVAGINAL ULTRASOUND OF PELVIS TECHNIQUE: Both transabdominal and transvaginal ultrasound examinations of the pelvis were performed. Transabdominal technique was performed for global imaging of the pelvis including uterus, ovaries, adnexal regions, and pelvic cul-de-sac. It was necessary to proceed with endovaginal exam following the transabdominal exam to visualize the endometrium and ovaries. COMPARISON:  None FINDINGS: Uterus Measurements: 7 x 4.7 x 6.4 cm = volume: 109 cc mL. No fibroids or other mass visualized. Endometrium Thickness: 9 mm. Solid, heterogeneous mass within the endometrial cavity measures 1.1 x 0.5 x 1.1 cm. There is increased blood flow associated with this structure. Right ovary Measurements: 2.8 by 1.4 x 1.6 cm = volume: 3.1 mL. Normal appearance/no adnexal mass. Left ovary Measurements: 2.2 x 1.4 x 1.5 cm = volume: 2.3 mL. Normal appearance/no adnexal mass. Other findings Small volume of free fluid noted. IMPRESSION: 1. Solid heterogeneous mass with increased blood flow is identified within the endometrial cavity. In the appropriate clinical setting findings are consistent with retained products of conception. Electronically Signed   By: Kerby Moors M.D.   On: 08/15/2020 14:20      Assessment and Plan:      1. Retained products of conception following abortion Reassured by bleeding after misoprostol, will follow up HCG today. - Beta hCG quant (ref lab)  2. Encounter for Depo-Provera contraception Currently  on Sprintec, wants to switch to Depo Provera. R/B/I/A discussed.  Injection given today, follow up every three months.  Recommended condoms for STI prevention. - medroxyPROGESTERone (DEPO-PROVERA) injection 150 mg  3. Routine screening for STI (sexually transmitted infection) Desires comprehensive STI screen, will follow up results and manage accordingly. - Hepatitis B surface antigen - Hepatitis C antibody - HIV Antibody (routine testing w rflx) - RPR - Urine cytology ancillary only([facility]) Routine preventative health maintenance measures emphasized, will get pap smear soon. Please refer to After Visit Summary for other counseling recommendations.   Return in about 3 months (around 11/22/2020) for Depo Provera initiation  Also needs annual exam, pap smear next available..    I spent 19 minutes dedicated to the care of this patient including pre-visit review of records, face to face time with the patient discussing her conditions and treatments and post visit ordering of testing.    Verita Schneiders, MD, [place] for [employer], [place]

## 2020-08-25 LAB — HIV ANTIBODY (ROUTINE TESTING W REFLEX): HIV Screen 4th Generation wRfx: NONREACTIVE

## 2020-08-25 LAB — HEPATITIS C ANTIBODY: Hep C Virus Ab: <0.1 s/co ratio (ref 0.0–0.9)

## 2020-08-25 LAB — RPR: RPR Ser Ql: NONREACTIVE

## 2020-08-25 LAB — BETA HCG QUANT (REF LAB): hCG Quant: 24 m[IU]/mL

## 2020-08-25 LAB — HEPATITIS B SURFACE ANTIGEN: Hepatitis B Surface Ag: NEGATIVE

## 2020-08-29 ENCOUNTER — Telehealth: Payer: Self-pay | Admitting: Family Medicine

## 2020-08-29 NOTE — Telephone Encounter (Signed)
Patient state it's been almost a week and she still don't have her test results, patient is requesting a cal back ASAP

## 2020-08-31 NOTE — Telephone Encounter (Signed)
mychart message sent to patient

## 2020-09-07 ENCOUNTER — Other Ambulatory Visit (HOSPITAL_COMMUNITY)
Admission: RE | Admit: 2020-09-07 | Discharge: 2020-09-07 | Disposition: A | Discharge status: Home / Self Care | Payer: Medicaid Other | Source: Ambulatory Visit | Attending: Family Medicine | Admitting: Family Medicine

## 2020-09-07 ENCOUNTER — Other Ambulatory Visit: Payer: Self-pay

## 2020-09-07 ENCOUNTER — Ambulatory Visit (INDEPENDENT_AMBULATORY_CARE_PROVIDER_SITE_OTHER): Payer: Medicaid Other

## 2020-09-07 VITALS — BP 113/72 | HR 79

## 2020-09-07 DIAGNOSIS — Z113 Encounter for screening for infections with a predominantly sexual mode of transmission: Secondary | ICD-10-CM | POA: Diagnosis not present

## 2020-09-07 NOTE — Progress Notes (Signed)
Pt here today requesting STD screening.  Pt reports that she was here at her last visit and did not get the screening she requested.  Pt explained how to obtain self swab and that I will be testing for GC/Ch, Trich, BV, and yeast.  Pt also advised that we will call with abnormal results.  Pt verbalized understanding with no further questions.   Mel Almond, RN  09/07/20

## 2020-09-08 LAB — CERVICOVAGINAL ANCILLARY ONLY
Bacterial Vaginitis (gardnerella): POSITIVE — AB
Candida Glabrata: NEGATIVE
Candida Vaginitis: POSITIVE — AB
Chlamydia: NEGATIVE
Comment: NEGATIVE
Comment: NEGATIVE
Comment: NEGATIVE
Comment: NEGATIVE
Comment: NEGATIVE
Comment: NORMAL
Neisseria Gonorrhea: NEGATIVE
Trichomonas: POSITIVE — AB

## 2020-09-09 NOTE — Progress Notes (Signed)
I agree with the findings and care plan as documented in the nursing note.   Sharene Skeans, MD [facility] Fellow, [facility] for [facility], [place]

## 2020-09-13 ENCOUNTER — Other Ambulatory Visit: Payer: Self-pay

## 2020-09-13 MED ORDER — FLUCONAZOLE 150 MG PO TABS: 150.0000 mg | ORAL_TABLET | Freq: Once | ORAL | 0 refills | Status: AC

## 2020-09-13 MED ORDER — METRONIDAZOLE 500 MG PO TABS
500.0000 mg | ORAL_TABLET | Freq: Two times a day (BID) | ORAL | 0 refills | Status: DC
Start: 2020-09-13 — End: 2020-10-18

## 2020-09-13 MED ORDER — FLUCONAZOLE 150 MG PO TABS: 150.0000 mg | ORAL_TABLET | Freq: Once | ORAL | 0 refills | Status: DC

## 2020-09-13 MED ORDER — METRONIDAZOLE 500 MG PO TABS: 500.0000 mg | ORAL_TABLET | Freq: Two times a day (BID) | ORAL | 0 refills | Status: DC

## 2020-09-27 ENCOUNTER — Telehealth: Payer: Self-pay | Admitting: Obstetrics & Gynecology

## 2020-09-27 NOTE — Telephone Encounter (Signed)
Received a call from patient stating she was still bleeding, and could not wait for her march appointment. She requested to see any provider available.

## 2020-09-28 ENCOUNTER — Telehealth: Payer: Self-pay | Admitting: Family Medicine

## 2020-09-28 ENCOUNTER — Ambulatory Visit: Payer: Medicaid Other | Admitting: Advanced Practice Midwife

## 2020-09-28 NOTE — Telephone Encounter (Signed)
Pt called and wanted to resch her missed appt that she had today. Stated that her ride cancelled early this morning and she called 8am no answer so she went back to sleep.  I advised first avail 10-06-20 and pt was upset and started being rude  and wanted something tomorrow.as soon as possible, but I advised pt that  I did not have appt for tomorrow, but I could send a message to the [facility] and have someone call her back to discuss concerns or to see if she needs an appt earlier that 10-06-20 BUT  Patient  REFUSED the nurse cal back and states that she does not like nurse call back and to schedule her for the 10-06-20 and she will handle the rest.  I scheduled pt for 10-06-20.

## 2020-10-03 ENCOUNTER — Ambulatory Visit: Payer: Medicaid Other

## 2020-10-06 ENCOUNTER — Ambulatory Visit: Payer: Medicaid Other | Admitting: Advanced Practice Midwife

## 2020-10-10 ENCOUNTER — Telehealth: Payer: Self-pay

## 2020-10-10 ENCOUNTER — Ambulatory Visit: Payer: Medicaid Other | Admitting: Obstetrics and Gynecology

## 2020-10-10 NOTE — Telephone Encounter (Signed)
Called pt to follow up on missed appt this AM. Pt states she is no longer having vaginal bleeding. Agreeable to wait until next Depo Injection to make a decision about changing medication, communicated that it is very likely her bleeding will be under control after the next injection. I explained some vaginal bleeding is normal when changing birth control methods, especially starting Depo Provera.  Pt states she would like a TOC swab for trichomonas. [place] office notified to schedule nurse visit for 10 am or later, any day other than Monday.

## 2020-10-18 ENCOUNTER — Other Ambulatory Visit: Payer: Self-pay

## 2020-10-18 ENCOUNTER — Ambulatory Visit (INDEPENDENT_AMBULATORY_CARE_PROVIDER_SITE_OTHER): Payer: Medicaid Other

## 2020-10-18 ENCOUNTER — Other Ambulatory Visit (HOSPITAL_COMMUNITY)
Admission: RE | Admit: 2020-10-18 | Discharge: 2020-10-18 | Disposition: A | Discharge status: Home / Self Care | Payer: Medicaid Other | Source: Ambulatory Visit | Attending: Family Medicine | Admitting: Family Medicine

## 2020-10-18 VITALS — BP 117/76 | HR 82 | Wt 134.3 lb

## 2020-10-18 DIAGNOSIS — Z113 Encounter for screening for infections with a predominantly sexual mode of transmission: Secondary | ICD-10-CM | POA: Diagnosis not present

## 2020-10-18 NOTE — Progress Notes (Signed)
ATTESTATION OF SUPERVISION OF RN: Evaluation and management procedures were performed by the RN under my supervision and collaboration. I have reviewed the nursing note and chart and agree with the management and plan for this patient.  Brevon Dewald, CNM  

## 2020-10-18 NOTE — Progress Notes (Signed)
Here today for trichomonas test of cure. Self-swab instructions given and specimen obtained. Explained pt will be called in the next few days if any abnormal result. Pt will return 11/14/20 for annual visit with PAP smear and Depo Provera injection.   Apolonio Schneiders RN 10/18/20

## 2020-10-19 LAB — CERVICOVAGINAL ANCILLARY ONLY
Chlamydia: NEGATIVE
Comment: NEGATIVE
Comment: NEGATIVE
Comment: NORMAL
Neisseria Gonorrhea: NEGATIVE
Trichomonas: NEGATIVE

## 2020-10-26 ENCOUNTER — Ambulatory Visit: Payer: Medicaid Other | Admitting: Obstetrics & Gynecology

## 2020-11-14 ENCOUNTER — Ambulatory Visit: Payer: Medicaid Other | Admitting: Obstetrics and Gynecology

## 2020-11-22 ENCOUNTER — Ambulatory Visit: Payer: Medicaid Other

## 2020-11-24 ENCOUNTER — Ambulatory Visit: Payer: Medicaid Other

## 2020-12-12 ENCOUNTER — Other Ambulatory Visit (HOSPITAL_COMMUNITY)
Admission: RE | Admit: 2020-12-12 | Discharge: 2020-12-12 | Disposition: A | Discharge status: Home / Self Care | Payer: Medicaid Other | Source: Ambulatory Visit | Attending: Family Medicine | Admitting: Family Medicine

## 2020-12-12 ENCOUNTER — Ambulatory Visit (INDEPENDENT_AMBULATORY_CARE_PROVIDER_SITE_OTHER): Payer: Medicaid Other | Admitting: *Deleted

## 2020-12-12 ENCOUNTER — Encounter: Payer: Self-pay | Admitting: *Deleted

## 2020-12-12 ENCOUNTER — Other Ambulatory Visit: Payer: Self-pay

## 2020-12-12 VITALS — BP 101/67 | HR 73 | Ht 66.0 in | Wt 128.7 lb

## 2020-12-12 DIAGNOSIS — Z9189 Other specified personal risk factors, not elsewhere classified: Secondary | ICD-10-CM

## 2020-12-12 DIAGNOSIS — Z3042 Encounter for surveillance of injectable contraceptive: Secondary | ICD-10-CM | POA: Diagnosis present

## 2020-12-12 DIAGNOSIS — Z3202 Encounter for pregnancy test, result negative: Secondary | ICD-10-CM | POA: Diagnosis not present

## 2020-12-12 LAB — POCT PREGNANCY, URINE: Preg Test, Ur: NEGATIVE

## 2020-12-12 MED ORDER — MEDROXYPROGESTERONE ACETATE 150 MG/ML IM SUSP
150.0000 mg | Freq: Once | INTRAMUSCULAR | Status: AC
  Administered 2020-12-12: 150 mg via INTRAMUSCULAR

## 2020-12-12 NOTE — Progress Notes (Addendum)
Pt presents for Depo Provera injection. She has missed several appointments for Annual gyn exam and continuation of Depo Provera contraception (was due 4/6-4/19). Her last injection was 08/24/20. She stated that LMP was @ the time of last depo injection. She very much wants to re-start contraception now. She reports unprotected sex 2 weeks ago - no sex since then. Her urine pregnancy test is negative today. Depo Provera 150 mg IM administered. Pt tolerated well. Next injection due 7/25-8/8. Pt was advised that she must have Annual Gyn exam w/pap prior to receiving next dose of Depo Provera. Pt also requested self swab for STI testing due to recent unprotected sex. She denies vaginal discharge. Pt was advised that she will be notified of test results and treatment needed if any, via Mychart. She voiced understanding of all information and instructions given.    Chart reviewed for nurse visit. Agree with plan of care.   Virginia Rochester, NP 12/12/2020 3:19 PM

## 2020-12-13 LAB — CERVICOVAGINAL ANCILLARY ONLY
Chlamydia: NEGATIVE
Comment: NEGATIVE
Comment: NEGATIVE
Comment: NORMAL
Neisseria Gonorrhea: NEGATIVE
Trichomonas: NEGATIVE

## 2020-12-19 ENCOUNTER — Ambulatory Visit: Payer: Medicaid Other

## 2021-01-09 ENCOUNTER — Ambulatory Visit: Payer: Medicaid Other | Admitting: Family Medicine

## 2021-01-17 ENCOUNTER — Ambulatory Visit: Payer: Medicaid Other

## 2021-01-24 ENCOUNTER — Ambulatory Visit: Payer: Medicaid Other

## 2021-01-31 ENCOUNTER — Ambulatory Visit: Payer: Medicaid Other

## 2021-02-22 IMAGING — US US PELVIS COMPLETE WITH TRANSVAGINAL
1 series · 13 of 25 positions shown · non-contrast
Comparison: None

CLINICAL DATA: Status post abortion on 07/21/2020. Patient reports
passing clot on 08/02/20. positive pregnancy test on 08/11/2020.

EXAM:
TRANSABDOMINAL AND TRANSVAGINAL ULTRASOUND OF PELVIS
TECHNIQUE: Both transabdominal and transvaginal ultrasound examinations of the
pelvis were performed. Transabdominal technique was performed for
global imaging of the pelvis including uterus, ovaries, adnexal
regions, and pelvic cul-de-sac. It was necessary to proceed with
endovaginal exam following the transabdominal exam to visualize the
endometrium and ovaries.

[Series 1: us pelvis complete with transvaginal · 13 of 51 slices shown]
[im 1/51]
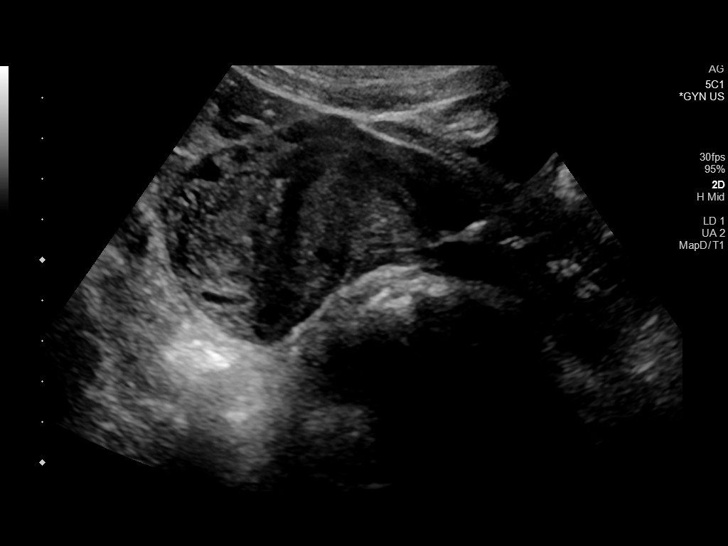
[im 5/51]
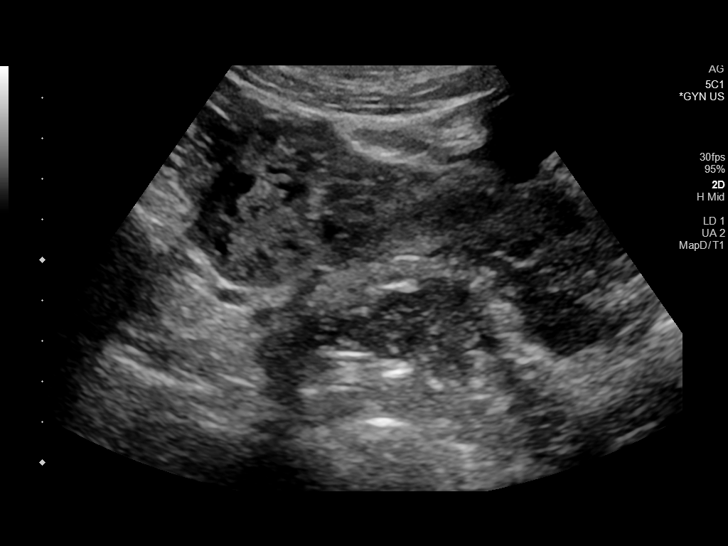
[im 9/51]
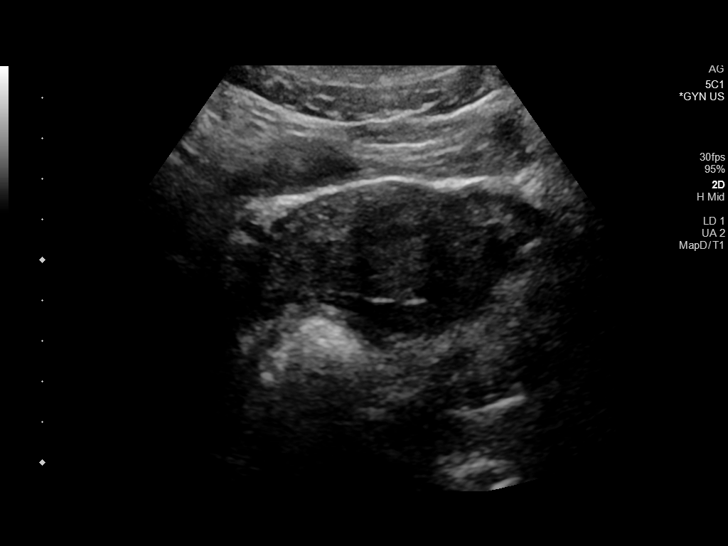
[im 13/51]
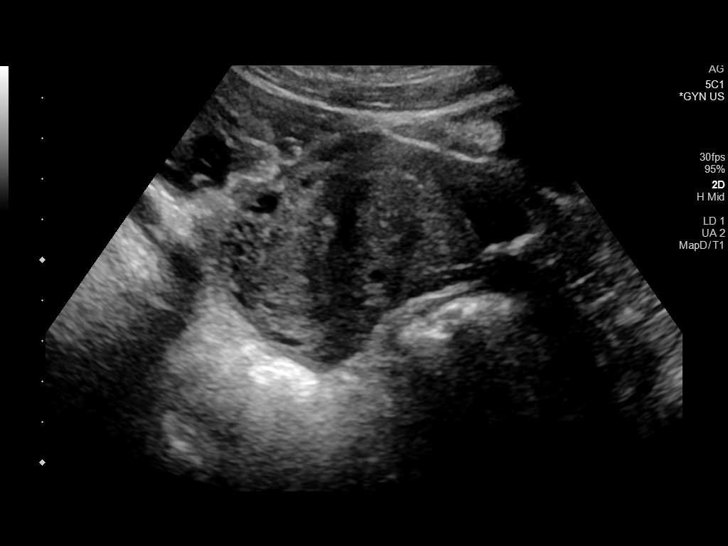
[im 17/51]
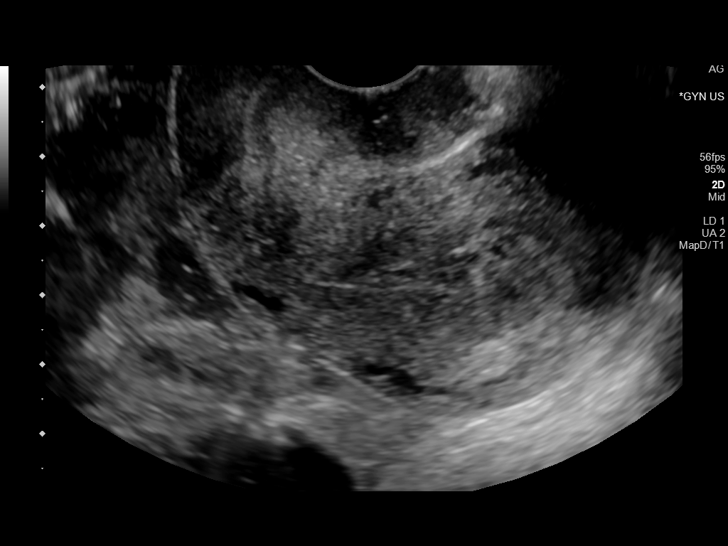
[im 21/51]
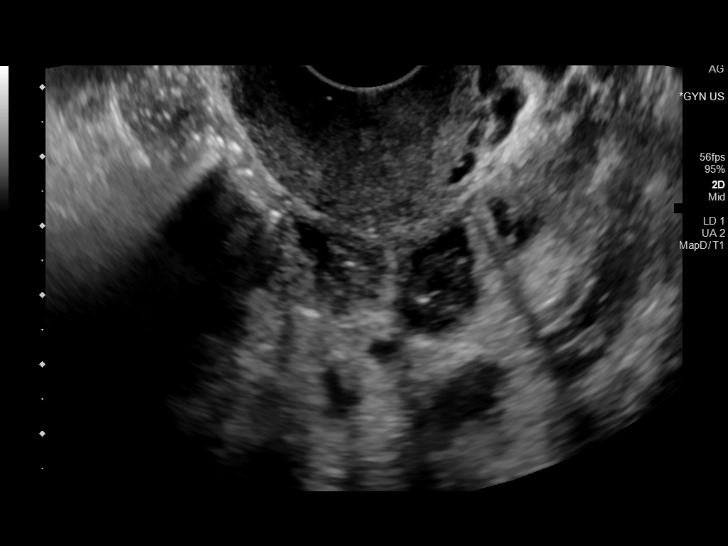
[im 26/51]
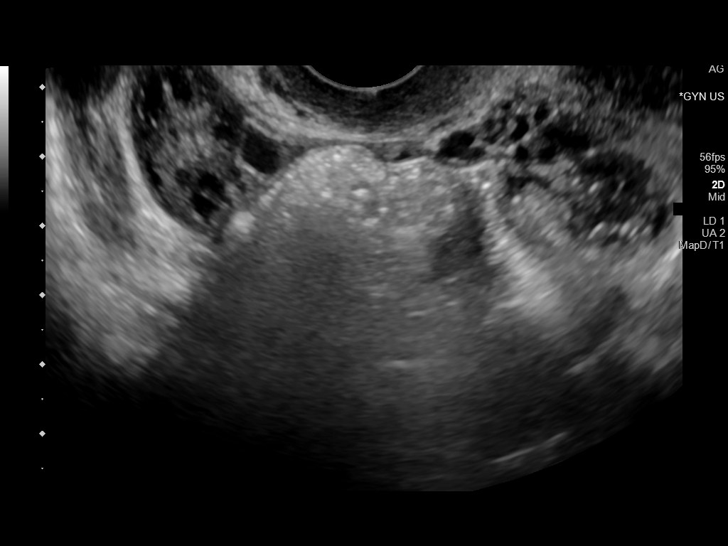
[im 30/51]
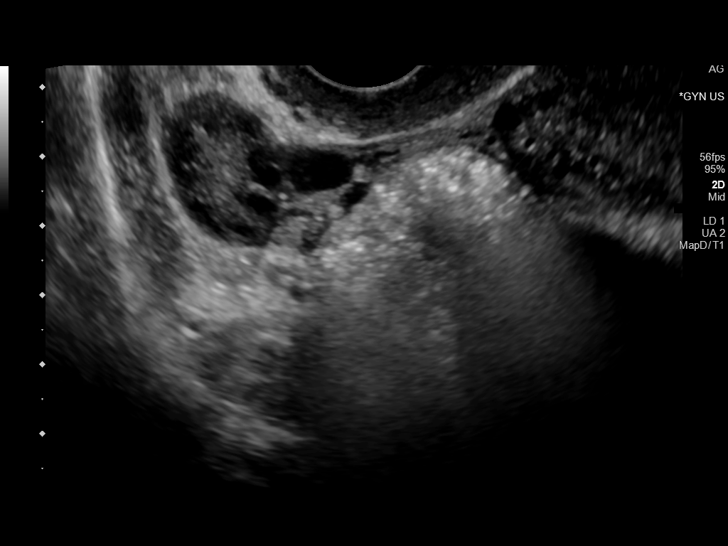
[im 34/51]
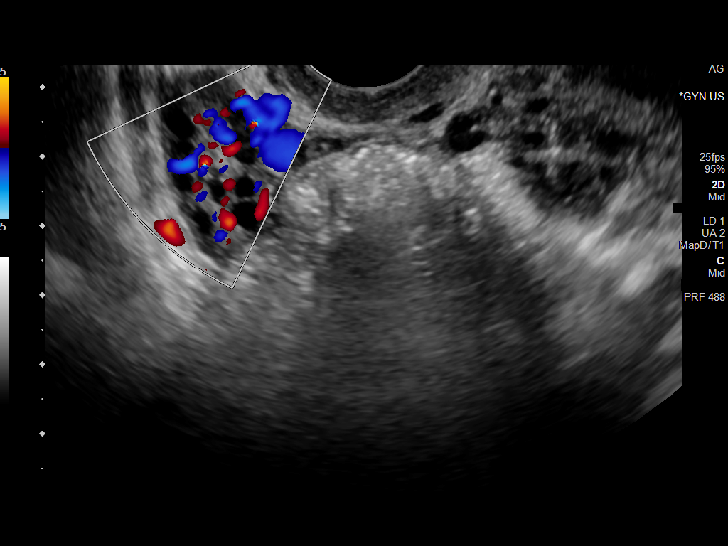
[im 38/51]
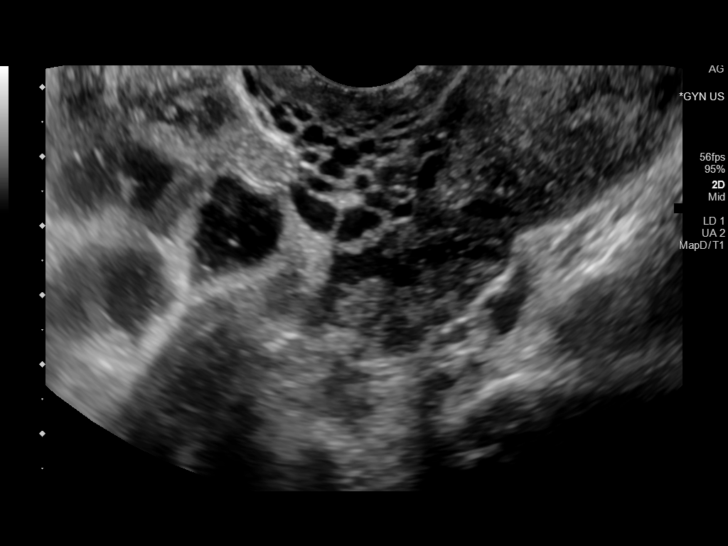
[im 42/51]
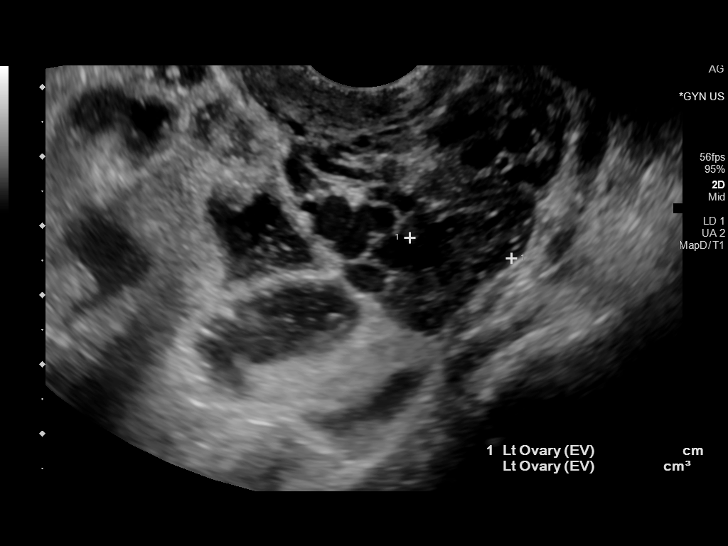
[im 46/51]
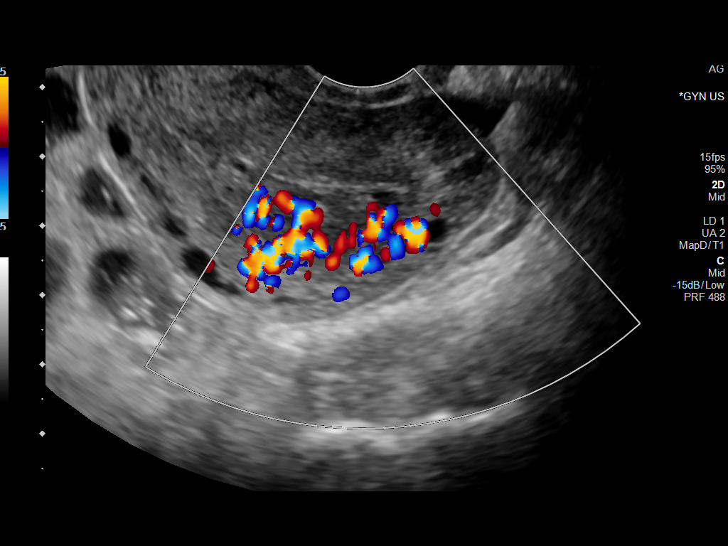
[im 51/51]
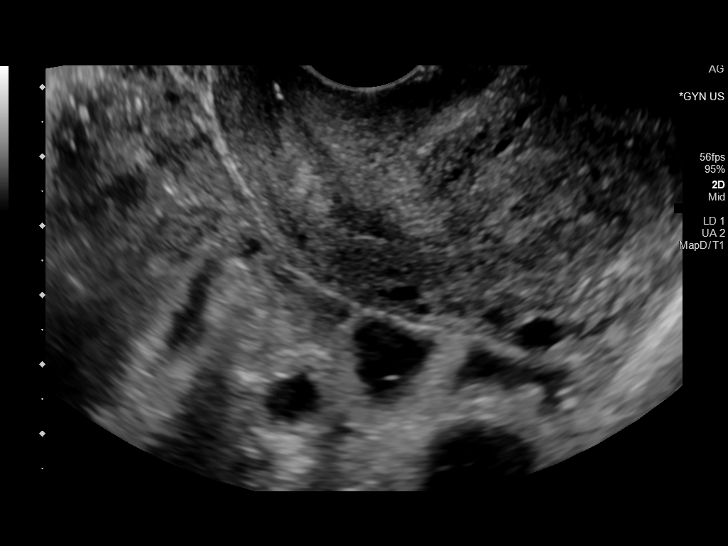

[13 of 25 positions shown; findings below may reference images not displayed]

FINDINGS: Uterus

Measurements: 7 x 4.7 x 6.4 cm = volume: 109 cc mL. No fibroids or
other mass visualized.

Endometrium

Thickness: 9 mm. Solid, heterogeneous mass within the endometrial
cavity measures 1.1 x 0.5 x 1.1 cm. There is increased blood flow
associated with this structure.

Right ovary

Measurements: 2.8 by 1.4 x 1.6 cm = volume: 3.1 mL. Normal
appearance/no adnexal mass.

Left ovary

Measurements: 2.2 x 1.4 x 1.5 cm = volume: 2.3 mL. Normal
appearance/no adnexal mass.

Other findings

Small volume of free fluid noted.
IMPRESSION: 1. Solid heterogeneous mass with increased blood flow is identified
within the endometrial cavity. In the appropriate clinical setting
findings are consistent with retained products of conception.

## 2021-02-27 ENCOUNTER — Other Ambulatory Visit: Payer: Self-pay

## 2021-02-27 ENCOUNTER — Ambulatory Visit: Payer: Medicaid Other | Admitting: General Practice

## 2021-02-28 ENCOUNTER — Ambulatory Visit: Payer: Medicaid Other

## 2021-02-28 NOTE — Progress Notes (Signed)
Erroneous encounter

## 2021-03-01 ENCOUNTER — Ambulatory Visit (INDEPENDENT_AMBULATORY_CARE_PROVIDER_SITE_OTHER): Payer: Medicaid Other | Admitting: Obstetrics and Gynecology

## 2021-03-01 ENCOUNTER — Encounter: Payer: Self-pay | Admitting: Obstetrics and Gynecology

## 2021-03-01 ENCOUNTER — Other Ambulatory Visit (HOSPITAL_COMMUNITY)
Admission: RE | Admit: 2021-03-01 | Discharge: 2021-03-01 | Disposition: A | Discharge status: Home / Self Care | Payer: Medicaid Other | Source: Ambulatory Visit | Attending: Obstetrics and Gynecology | Admitting: Obstetrics and Gynecology

## 2021-03-01 ENCOUNTER — Other Ambulatory Visit: Payer: Self-pay

## 2021-03-01 VITALS — BP 108/72 | HR 72 | Wt 134.0 lb

## 2021-03-01 DIAGNOSIS — Z113 Encounter for screening for infections with a predominantly sexual mode of transmission: Secondary | ICD-10-CM | POA: Diagnosis not present

## 2021-03-01 DIAGNOSIS — Z124 Encounter for screening for malignant neoplasm of cervix: Secondary | ICD-10-CM | POA: Diagnosis not present

## 2021-03-01 DIAGNOSIS — Z01419 Encounter for gynecological examination (general) (routine) without abnormal findings: Secondary | ICD-10-CM | POA: Diagnosis not present

## 2021-03-01 DIAGNOSIS — Z3009 Encounter for other general counseling and advice on contraception: Secondary | ICD-10-CM

## 2021-03-01 DIAGNOSIS — Z3042 Encounter for surveillance of injectable contraceptive: Secondary | ICD-10-CM | POA: Diagnosis not present

## 2021-03-01 MED ORDER — MEDROXYPROGESTERONE ACETATE 150 MG/ML IM SUSP
150.0000 mg | Freq: Once | INTRAMUSCULAR | Status: AC
  Administered 2021-03-01: 150 mg via INTRAMUSCULAR

## 2021-03-01 NOTE — Progress Notes (Signed)
Patient is here for Depo provera injection. Depo provera '150mg'$  injection was administered into right upper outer quadrant without any complications. She will return between October 12 and May 31, 2021   [place], [place]   03/01/21

## 2021-03-01 NOTE — Progress Notes (Signed)
GYNECOLOGY ANNUAL PREVENTATIVE CARE ENCOUNTER NOTE  Subjective:   Tricia Campbell is a 30 y.o. G27P1021 female here for a annual gynecologic exam. Current complaints: here for depo shot, STI screen, pap.  No concerns about STI exposures. Random spotting with depo but not bothersome.  Denies abnormal vaginal bleeding, discharge, pelvic pain, problems with intercourse or other gynecologic concerns. Accepts STI screen.   Gynecologic History Patient's last menstrual period was 03/01/2021 (approximate). Contraception: Depo-Provera injections Last Pap: 03/2017. Results: normal Last mammogram: n/a Gardisil: unsure  Obstetric History OB History  Gravida Para Term Preterm AB Living  '3 1 1 '$ 0 2 1  SAB IAB Ectopic Multiple Live Births  0 2 0 0 1    # Outcome Date GA Lbr Len/2nd Weight Sex Delivery Anes PTL Lv  3 IAB 2021          2 IAB 2020          1 Term 02/25/17 [id number] / 00:12 7 lb 7.1 oz (3.375 kg) M Vag-Spont None  LIV    Past Medical History:  Diagnosis Date   Vaginal delivery     Past Surgical History:  Procedure Laterality Date   APPENDECTOMY     LAPAROSCOPIC APPENDECTOMY N/A 07/23/2018   Procedure: APPENDECTOMY LAPAROSCOPIC;  Surgeon: NAlphonsa Overall MD;  Location: [facility];  Service: General;  Laterality: N/A;   WISDOM TOOTH EXTRACTION      Current Outpatient Medications on File Prior to Visit  Medication Sig Dispense Refill   ibuprofen (ADVIL) 600 MG tablet Take 1 tablet (600 mg total) by mouth every 6 (six) hours as needed. (Patient not taking: Reported on 12/12/2020) 60 tablet 3   No current facility-administered medications on file prior to visit.    Allergies  Allergen Reactions   Latex Rash    Social History   Socioeconomic History   Marital status: Single    Spouse name: Not on file   Number of children: Not on file   Years of education: Not on file   Highest education level: Not on file  Occupational History   Not on file  Tobacco Use    Smoking status: Former    Packs/day: 0.20    Types: Cigars, Cigarettes    Quit date: 12/05/2015    Years since quitting: 5.2   Smokeless tobacco: Never  Substance and Sexual Activity   Alcohol use: No   Drug use: No   Sexual activity: Yes  Other Topics Concern   Not on file  Social History Narrative   Not on file   Social Determinants of Health   Financial Resource Strain: Not on file  Food Insecurity: Food Insecurity Present   W[place] the Last Year: Never true   Ran Out of Food in the Last Year: Sometimes true  Transportation Needs: Unmet Transportation Needs   Lack of Transportation (Medical): Yes   Lack of Transportation (Non-Medical): Yes  Physical Activity: Not on file  Stress: Not on file  Social Connections: Not on file  Intimate Partner Violence: Not on file    Family History  Problem Relation Age of Onset   Asthma Neg Hx    Diabetes Neg Hx    Heart disease Neg Hx    Hypertension Neg Hx    Kidney disease Neg Hx    Stroke Neg Hx     The following portions of the patient's history were reviewed and updated as appropriate: allergies, current medications, past family  history, past medical history, past social history, past surgical history and problem list.  Review of Systems Pertinent items are noted in HPI.   Objective:  BP 108/72   Pulse 72   Wt 134 lb (60.8 kg)   LMP 03/01/2021 (Approximate)   BMI 21.63 kg/m  CONSTITUTIONAL: Well-developed, well-nourished female in no acute distress.  HENT:  Normocephalic, atraumatic, External right and left ear normal. Oropharynx is clear and moist EYES: Conjunctivae and EOM are normal. Pupils are equal, round, and reactive to light. No scleral icterus.  NECK: Normal range of motion, supple, no masses.  Normal thyroid.  SKIN: Skin is warm and dry. No rash noted. Not diaphoretic. No erythema. No pallor. NEUROLOGIC: Alert and oriented to person, place, and time. Normal reflexes, muscle tone  coordination. No cranial nerve deficit noted. PSYCHIATRIC: Normal mood and affect. Normal behavior. Normal judgment and thought content. CARDIOVASCULAR: Normal heart rate noted RESPIRATORY: Effort normal, no problems with respiration noted. BREASTS: deferred ABDOMEN: Soft, no distention noted.  No tenderness, rebound or guarding.  PELVIC: Normal appearing external genitalia; normal appearing vaginal mucosa and cervix.  No abnormal discharge noted.  Pap smear obtained. Pelvic cultures obtained. Normal uterine size, no other palpable masses, no uterine or adnexal tenderness. MUSCULOSKELETAL: Normal range of motion. No tenderness.  No cyanosis, clubbing, or edema.  2+ distal pulses.  Exam done with chaperone present.  Assessment and Plan:   1. Well woman exam Healthy female exam Recommended COVID vaccination  2. Routine screening for STI (sexually transmitted infection) - Cervicovaginal ancillary only( [place]) - Hepatitis B surface antigen - Hepatitis C antibody - HIV Antibody (routine testing w rflx) - RPR  3. Encounter for counseling regarding contraception Depo today  4. Cervical cancer screening - Cytology - PAP( [place])   Will follow up results of pap smear/STI screen and manage accordingly. Encouraged improvement in diet and exercise.  Accepts STI screen. COVID vaccine declines Mammogram n/a Referral for colonoscopy n/a Flu vaccine n/a Gardisil n/a  Routine preventative health maintenance measures emphasized. Please refer to After Visit Summary for other counseling recommendations.    Feliz Beam, MD, [place] for [facility])

## 2021-03-02 LAB — HIV ANTIBODY (ROUTINE TESTING W REFLEX): HIV Screen 4th Generation wRfx: NONREACTIVE

## 2021-03-02 LAB — HEPATITIS C ANTIBODY: Hep C Virus Ab: <0.1 s/co ratio (ref 0.0–0.9)

## 2021-03-02 LAB — RPR: RPR Ser Ql: NONREACTIVE

## 2021-03-02 LAB — HEPATITIS B SURFACE ANTIGEN: Hepatitis B Surface Ag: NEGATIVE

## 2021-03-03 LAB — CERVICOVAGINAL ANCILLARY ONLY
Chlamydia: NEGATIVE
Comment: NEGATIVE
Comment: NEGATIVE
Comment: NORMAL
Neisseria Gonorrhea: NEGATIVE
Trichomonas: NEGATIVE

## 2021-03-03 LAB — CYTOLOGY - PAP: Diagnosis: NEGATIVE

## 2021-03-15 ENCOUNTER — Ambulatory Visit: Payer: Medicaid Other | Admitting: Podiatry

## 2021-03-22 ENCOUNTER — Ambulatory Visit: Payer: Medicaid Other | Admitting: Podiatry

## 2021-05-01 ENCOUNTER — Ambulatory Visit: Payer: Medicaid Other

## 2021-05-05 ENCOUNTER — Ambulatory Visit: Payer: Medicaid Other

## 2021-05-11 ENCOUNTER — Ambulatory Visit (INDEPENDENT_AMBULATORY_CARE_PROVIDER_SITE_OTHER): Payer: Medicaid Other | Admitting: General Practice

## 2021-05-11 ENCOUNTER — Other Ambulatory Visit (HOSPITAL_COMMUNITY)
Admission: RE | Admit: 2021-05-11 | Discharge: 2021-05-11 | Disposition: A | Discharge status: Home / Self Care | Payer: Medicaid Other | Source: Ambulatory Visit | Attending: Family Medicine | Admitting: Family Medicine

## 2021-05-11 ENCOUNTER — Other Ambulatory Visit: Payer: Self-pay

## 2021-05-11 VITALS — BP 114/69 | HR 80 | Ht 66.0 in | Wt 129.0 lb

## 2021-05-11 DIAGNOSIS — N898 Other specified noninflammatory disorders of vagina: Secondary | ICD-10-CM

## 2021-05-11 DIAGNOSIS — Z113 Encounter for screening for infections with a predominantly sexual mode of transmission: Secondary | ICD-10-CM | POA: Diagnosis present

## 2021-05-11 DIAGNOSIS — Z3042 Encounter for surveillance of injectable contraceptive: Secondary | ICD-10-CM

## 2021-05-11 MED ORDER — MEDROXYPROGESTERONE ACETATE 150 MG/ML IM SUSP
150.0000 mg | Freq: Once | INTRAMUSCULAR | Status: AC
  Administered 2021-05-11: 150 mg via INTRAMUSCULAR

## 2021-05-11 NOTE — Progress Notes (Signed)
Patient presents to office today reporting abnormal odor x 1 week. Denies itching/irritation or pelvic pain. Patient is requesting STD testing today as well and depo shot. Per Dr Roselie Awkward, can give depo a few days early. Patient was instructed in self swab & specimen was collected. Advised results will be back in 24-48 hours and available in mychart. Depo injection given. Next dose is due Dec 22-Jan 5. Patient will need annual May 2023.    Derinda Late, RN 05/11/2021  11:11 AM

## 2021-05-15 ENCOUNTER — Other Ambulatory Visit: Payer: Self-pay | Admitting: Obstetrics & Gynecology

## 2021-05-15 DIAGNOSIS — N898 Other specified noninflammatory disorders of vagina: Secondary | ICD-10-CM

## 2021-05-15 LAB — CERVICOVAGINAL ANCILLARY ONLY
Bacterial Vaginitis (gardnerella): POSITIVE — AB
Candida Glabrata: NEGATIVE
Candida Vaginitis: NEGATIVE
Chlamydia: NEGATIVE
Comment: NEGATIVE
Comment: NEGATIVE
Comment: NEGATIVE
Comment: NEGATIVE
Comment: NEGATIVE
Comment: NORMAL
Neisseria Gonorrhea: NEGATIVE
Trichomonas: NEGATIVE

## 2021-05-15 MED ORDER — METRONIDAZOLE 500 MG PO TABS: 500.0000 mg | ORAL_TABLET | Freq: Two times a day (BID) | ORAL | 0 refills | Status: AC

## 2021-05-15 NOTE — Progress Notes (Signed)
Meds ordered this encounter  Medications   metroNIDAZOLE (FLAGYL) 500 MG tablet    Sig: Take 1 tablet (500 mg total) by mouth 2 (two) times daily for 7 days.    Dispense:  14 tablet    Refill:  0   Sent to [facility]  on [address].

## 2021-05-30 ENCOUNTER — Ambulatory Visit: Payer: Medicaid Other | Admitting: Podiatry

## 2021-06-01 ENCOUNTER — Other Ambulatory Visit: Payer: Self-pay

## 2021-06-01 ENCOUNTER — Ambulatory Visit (INDEPENDENT_AMBULATORY_CARE_PROVIDER_SITE_OTHER): Payer: Medicaid Other | Admitting: Podiatry

## 2021-06-01 ENCOUNTER — Ambulatory Visit: Payer: Medicaid Other

## 2021-06-01 ENCOUNTER — Encounter: Payer: Self-pay | Admitting: Podiatry

## 2021-06-01 DIAGNOSIS — D492 Neoplasm of unspecified behavior of bone, soft tissue, and skin: Secondary | ICD-10-CM

## 2021-06-01 DIAGNOSIS — D2371 Other benign neoplasm of skin of right lower limb, including hip: Secondary | ICD-10-CM

## 2021-06-01 DIAGNOSIS — M778 Other enthesopathies, not elsewhere classified: Secondary | ICD-10-CM

## 2021-06-01 NOTE — Progress Notes (Signed)
  Subjective:  Patient ID: Tricia Campbell, female    DOB: 24-Jun-1991,  MRN: [id number] HPI Chief Complaint  Patient presents with   Foot Pain    Plantar/lateral right - callused area x 1 year, broke glass and stepped on it, another foot doc dug it out, then became a large callus, very tender with any activity, tries to trim at home, also callus hallux right medial   New Patient (Initial Visit)    30 y.o. female presents with the above complaint.   ROS: Denies fever chills nausea vomiting muscle aches pains calf pain back pain chest pain shortness of breath.  Past Medical History:  Diagnosis Date   Vaginal delivery    Past Surgical History:  Procedure Laterality Date   APPENDECTOMY     LAPAROSCOPIC APPENDECTOMY N/A 07/23/2018   Procedure: APPENDECTOMY LAPAROSCOPIC;  Surgeon: Alphonsa Overall, MD;  Location: [facility];  Service: General;  Laterality: N/A;   WISDOM TOOTH EXTRACTION      Current Outpatient Medications:    Acetaminophen-Codeine 300-30 MG tablet, Take 1 tablet by mouth every 6 (six) hours., Disp: , Rfl:    ibuprofen (ADVIL) 600 MG tablet, Take 1 tablet (600 mg total) by mouth every 6 (six) hours as needed., Disp: 60 tablet, Rfl: 3  Allergies  Allergen Reactions   Latex Rash   Review of Systems Objective:  There were no vitals filed for this visit.  General: Well developed, nourished, in no acute distress, alert and oriented x3   Dermatological: Skin is warm, dry and supple bilateral. Nails x 10 are well maintained; remaining integument appears unremarkable at this time. There are no open sores, no preulcerative lesions, no rash or signs of infection present.  There appears to be a porokeratotic lesion to the plantar aspect of the fifth met base area of the right foot.  Skin lines circumvent the lesion though there are no thrombosed capillaries and there is a keratotic core to the plantar aspect once debrided.  Vascular: Dorsalis Pedis artery and Posterior Tibial  artery pedal pulses are 2/4 bilateral with immedate capillary fill time. Pedal hair growth present. No varicosities and no lower extremity edema present bilateral.   Neruologic: Grossly intact via light touch bilateral. Vibratory intact via tuning fork bilateral. Protective threshold with Semmes Wienstein monofilament intact to all pedal sites bilateral. Patellar and Achilles deep tendon reflexes 2+ bilateral. No Babinski or clonus noted bilateral.   Musculoskeletal: No gross boney pedal deformities bilateral. No pain, crepitus, or limitation noted with foot and ankle range of motion bilateral. Muscular strength 5/5 in all groups tested bilateral.  Gait: Unassisted, Nonantalgic.    Radiographs:  None taken  Assessment & Plan:   Assessment: Benign hyperkeratotic skin lesion. Plan: Debrided lesion today for her we will follow-up with her in a couple weeks for curettage.     Chancellor Vanderloop T. [place], [place]

## 2021-06-06 ENCOUNTER — Telehealth: Payer: Self-pay | Admitting: General Practice

## 2021-06-06 DIAGNOSIS — B9689 Other specified bacterial agents as the cause of diseases classified elsewhere: Secondary | ICD-10-CM

## 2021-06-06 MED ORDER — METRONIDAZOLE 500 MG PO TABS: 500.0000 mg | ORAL_TABLET | Freq: Two times a day (BID) | ORAL | 0 refills | Status: DC

## 2021-06-06 NOTE — Telephone Encounter (Signed)
Patient called into front office requesting a callback about a medication. Called patient and she states she needs her prescription sent in from when she came into the office a few weeks ago. Patient states she called the pharmacy and was told nothing was ready for pickup.Told patient it looks like the prescription was sent in a few weeks ago and the pharmacy likely doesn't have it ready any longer. Reviewed pharmacy it was sent to was [facility] on [address]. Patient states she hasn't used that pharmacy in years and needs it sent to the one on [address]. Told patient I would send prescription there. Patient verbalized understanding.

## 2021-06-13 ENCOUNTER — Ambulatory Visit: Payer: Medicaid Other | Admitting: Podiatry

## 2021-06-15 ENCOUNTER — Ambulatory Visit (INDEPENDENT_AMBULATORY_CARE_PROVIDER_SITE_OTHER): Payer: Medicaid Other

## 2021-06-15 ENCOUNTER — Other Ambulatory Visit: Payer: Self-pay

## 2021-06-15 VITALS — BP 110/66 | HR 68 | Wt 131.8 lb

## 2021-06-15 DIAGNOSIS — N898 Other specified noninflammatory disorders of vagina: Secondary | ICD-10-CM

## 2021-06-15 MED ORDER — FLUCONAZOLE 150 MG PO TABS: 150.0000 mg | ORAL_TABLET | Freq: Once | ORAL | 0 refills | Status: AC

## 2021-06-15 NOTE — Progress Notes (Signed)
Patient seen and assessed by nursing staff.  Agree with documentation and plan.  

## 2021-06-15 NOTE — Progress Notes (Signed)
Here today with complaint of vaginal irritation during antibiotic course for bacterial vaginosis. Reports recent positive test for BV, did not begin antibiotics until a few days ago. Explained I will send Diflucan tablet per protocol, does not need to complete a second vaginal swab. Patient encouraged to follow up in 1 week if she is still experiencing vaginal odor or irritation.   Apolonio Schneiders RN 06/15/21

## 2021-07-06 ENCOUNTER — Ambulatory Visit: Payer: Medicaid Other | Admitting: Podiatry

## 2021-07-11 ENCOUNTER — Ambulatory Visit: Payer: Medicaid Other | Admitting: Podiatry

## 2021-07-12 ENCOUNTER — Other Ambulatory Visit: Payer: Self-pay

## 2021-07-12 ENCOUNTER — Ambulatory Visit (INDEPENDENT_AMBULATORY_CARE_PROVIDER_SITE_OTHER): Payer: Medicaid Other

## 2021-07-12 ENCOUNTER — Other Ambulatory Visit (HOSPITAL_COMMUNITY)
Admission: RE | Admit: 2021-07-12 | Discharge: 2021-07-12 | Disposition: A | Discharge status: Home / Self Care | Payer: Medicaid Other | Source: Ambulatory Visit | Attending: Family Medicine | Admitting: Family Medicine

## 2021-07-12 VITALS — BP 105/85 | HR 99 | Ht 66.0 in | Wt 130.9 lb

## 2021-07-12 DIAGNOSIS — N898 Other specified noninflammatory disorders of vagina: Secondary | ICD-10-CM | POA: Diagnosis present

## 2021-07-12 NOTE — Progress Notes (Signed)
Pt here today for self swab. Pt states having a yellow discharge x 1 week. Pt had hx of BV x 1 month ago and got treated with Rx Flagyl then she developed a yeast infection and her last Diflucan was  2 weeks ago. Pt denies odor, itching or irritation.  Self swab collected today. Pt advised results will take 24-48 hours and will see results in mychart and will be notified if needs further treatment. Pt verbalized understanding.   Colletta Maryland, RN

## 2021-07-13 NOTE — Progress Notes (Signed)
Patient was assessed and managed by nursing staff during this encounter. I have reviewed the chart and agree with the documentation and plan. I have also made any necessary editorial changes.  Griffin Basil, MD 07/13/2021 8:50 AM

## 2021-07-14 ENCOUNTER — Ambulatory Visit: Payer: Medicaid Other | Admitting: Podiatry

## 2021-07-14 LAB — CERVICOVAGINAL ANCILLARY ONLY
Bacterial Vaginitis (gardnerella): POSITIVE — AB
Candida Glabrata: NEGATIVE
Candida Vaginitis: NEGATIVE
Chlamydia: NEGATIVE
Comment: NEGATIVE
Comment: NEGATIVE
Comment: NEGATIVE
Comment: NEGATIVE
Comment: NEGATIVE
Comment: NORMAL
Neisseria Gonorrhea: NEGATIVE
Trichomonas: NEGATIVE

## 2021-07-17 ENCOUNTER — Telehealth: Payer: Self-pay

## 2021-07-17 MED ORDER — METRONIDAZOLE 500 MG PO TABS: 500.0000 mg | ORAL_TABLET | Freq: Two times a day (BID) | ORAL | 0 refills | Status: DC

## 2021-07-17 NOTE — Telephone Encounter (Addendum)
-----   Message from Griffin Basil, MD sent at 07/14/2021  2:02 PM EST ----- Bacterial vaginosis noted, we will offer treatment  Attempted to contact pt about BV results.  Unable to leave message due to voicemail box not set up yet.  MyChart  message sent.    Frances Nickels  07/17/21

## 2021-07-27 ENCOUNTER — Ambulatory Visit: Payer: Medicaid Other | Admitting: Podiatry

## 2021-07-27 ENCOUNTER — Ambulatory Visit (INDEPENDENT_AMBULATORY_CARE_PROVIDER_SITE_OTHER): Payer: Medicaid Other | Admitting: Podiatry

## 2021-07-27 ENCOUNTER — Other Ambulatory Visit: Payer: Self-pay

## 2021-07-27 ENCOUNTER — Ambulatory Visit (INDEPENDENT_AMBULATORY_CARE_PROVIDER_SITE_OTHER): Payer: Medicaid Other

## 2021-07-27 VITALS — BP 122/77 | HR 71 | Wt 128.1 lb

## 2021-07-27 DIAGNOSIS — Z3042 Encounter for surveillance of injectable contraceptive: Secondary | ICD-10-CM

## 2021-07-27 DIAGNOSIS — L0889 Other specified local infections of the skin and subcutaneous tissue: Secondary | ICD-10-CM

## 2021-07-27 DIAGNOSIS — L989 Disorder of the skin and subcutaneous tissue, unspecified: Secondary | ICD-10-CM

## 2021-07-27 MED ORDER — MEDROXYPROGESTERONE ACETATE 150 MG/ML IM SUSP
150.0000 mg | Freq: Once | INTRAMUSCULAR | Status: AC
  Administered 2021-07-27: 11:00:00 150 mg via INTRAMUSCULAR

## 2021-07-27 NOTE — Patient Instructions (Addendum)
Primary Care [email or web address]  Chiropractor Rosanna Randy w/ [facility]. [place], [place] [address] [phone] [email or web address]

## 2021-07-27 NOTE — Progress Notes (Signed)
Latana Bishop-MORRISON here for Depo-Provera Injection. Injection administered without complication. Patient will return in 3 months for next injection between 10/12/21 and 10/26/21. Next annual visit due July 2023.   Patient reports ongoing back pain. Encouraged pt to schedule with PCP, link provided in AVS.  Annabell Howells, RN 07/27/2021  10:34 AM

## 2021-07-27 NOTE — Progress Notes (Signed)
She presents today states that the lesion is come back and is exquisitely painful.  She states that she would like to consider having it removed permanently.  Objective: Vital signs are stable alert and x3 beneath the fifth metatarsal base of the right foot demonstrates a small lesion measuring less than 5 mm in diameter with 6 skin lines that circumvent the lesion and a central cord that is exquisitely painful.  Is painful on direct palpation as well as medial lateral compression.  Assessment: Probable porokeratotic lesion but cannot rule out verrucoid lesion due to these skin lines circumventing the lesion.  Plan: After local anesthetic was administered surgical curettage was performed and the lesion was sent for pathologic evaluation.  She was provided with both oral and written home-going instruction for care and soaking of the wound and I will follow-up with her in 2 weeks.  She will call with questions or concerns.

## 2021-08-01 NOTE — Progress Notes (Signed)
Patient was assessed and managed by nursing staff during this encounter. I have reviewed the chart and agree with the documentation and plan. I have also made any necessary editorial changes.  Aletha Halim, MD 08/01/2021 11:50 AM

## 2021-08-06 NOTE — L&D Delivery Note (Signed)
Delivery Note At 7:00 AM a non-viable child was delivered via Vaginal, Spontaneous (Presentation:      ).  APGAR: 0 0, ; weight  pending.   Placenta status: still in uterus  Cord:   with the following complications:  .  Cord pH: na  Anesthesia:   Episiotomy:   Lacerations:   Suture Repair:  Est. Blood Loss (mL):  650 thus far    [employer] 07/19/2022, [phone]

## 2021-08-10 ENCOUNTER — Ambulatory Visit: Payer: Medicaid Other | Admitting: Podiatry

## 2021-08-14 ENCOUNTER — Encounter: Payer: Self-pay | Admitting: Podiatry

## 2021-08-14 NOTE — Addendum Note (Signed)
Addended by: Clovis Riley E on: 08/14/2021 03:19 PM   Modules accepted: Orders

## 2021-08-17 ENCOUNTER — Encounter: Payer: Self-pay | Admitting: Podiatry

## 2021-08-17 ENCOUNTER — Other Ambulatory Visit: Payer: Self-pay

## 2021-08-17 ENCOUNTER — Ambulatory Visit (INDEPENDENT_AMBULATORY_CARE_PROVIDER_SITE_OTHER): Payer: Medicaid Other | Admitting: Podiatry

## 2021-08-17 DIAGNOSIS — D2371 Other benign neoplasm of skin of right lower limb, including hip: Secondary | ICD-10-CM

## 2021-08-17 NOTE — Progress Notes (Signed)
She presents today for follow-up of surgical curettage plantar aspect of the right foot states that is still sore but is not hurting like it was.  Objective: Vital signs stable alert oriented x3 there is no erythema edema cellulitis drainage or odor lesion still has some reactive hyperkeratotic tissue surrounding it though there is a superficial small opening there is no purulence no malodor.  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  Plan: Encouraged her to continue soaking every other day Epson salts and warm water small amount of Neosporin and a Band-Aid daily and I will follow-up with her should this not resolve her problems.  Pathology report did demonstrate verruca plantaris.

## 2021-10-12 ENCOUNTER — Ambulatory Visit: Payer: Medicaid Other

## 2021-11-12 ENCOUNTER — Other Ambulatory Visit: Payer: Self-pay

## 2021-11-12 ENCOUNTER — Encounter (HOSPITAL_BASED_OUTPATIENT_CLINIC_OR_DEPARTMENT_OTHER): Payer: Self-pay

## 2021-11-12 ENCOUNTER — Emergency Department (HOSPITAL_BASED_OUTPATIENT_CLINIC_OR_DEPARTMENT_OTHER)
Admission: EM | Admit: 2021-11-12 | Discharge: 2021-11-12 | Disposition: A | Discharge status: Home / Self Care | Payer: Medicaid Other | Attending: Emergency Medicine | Admitting: Emergency Medicine

## 2021-11-12 DIAGNOSIS — F17210 Nicotine dependence, cigarettes, uncomplicated: Secondary | ICD-10-CM | POA: Insufficient documentation

## 2021-11-12 DIAGNOSIS — K529 Noninfective gastroenteritis and colitis, unspecified: Secondary | ICD-10-CM | POA: Insufficient documentation

## 2021-11-12 DIAGNOSIS — R112 Nausea with vomiting, unspecified: Secondary | ICD-10-CM | POA: Diagnosis present

## 2021-11-12 LAB — CBC WITH DIFFERENTIAL/PLATELET
Abs Immature Granulocytes: 0.02 10*3/uL (ref 0.00–0.07)
Basophils Absolute: 0 10*3/uL (ref 0.0–0.1)
Basophils Relative: 0 %
Eosinophils Absolute: 0 10*3/uL (ref 0.0–0.5)
Eosinophils Relative: 0 %
HCT: 38.4 % (ref 36.0–46.0)
Hemoglobin: 12.5 g/dL (ref 12.0–15.0)
Immature Granulocytes: 0 %
Lymphocytes Relative: 4 %
Lymphs Abs: 0.5 10*3/uL — ABNORMAL LOW (ref 0.7–4.0)
MCH: 29.1 pg (ref 26.0–34.0)
MCHC: 32.6 g/dL (ref 30.0–36.0)
MCV: 89.3 fL (ref 80.0–100.0)
Monocytes Absolute: 0.7 10*3/uL (ref 0.1–1.0)
Monocytes Relative: 7 %
Neutro Abs: 9.1 10*3/uL — ABNORMAL HIGH (ref 1.7–7.7)
Neutrophils Relative %: 89 %
Platelets: 249 10*3/uL (ref 150–400)
RBC: 4.3 MIL/uL (ref 3.87–5.11)
RDW: 12.4 % (ref 11.5–15.5)
WBC: 10.3 10*3/uL (ref 4.0–10.5)
nRBC: 0 % (ref 0.0–0.2)

## 2021-11-12 LAB — BASIC METABOLIC PANEL
Anion gap: 10 (ref 5–15)
BUN: 13 mg/dL (ref 6–20)
CO2: 21 mmol/L — ABNORMAL LOW (ref 22–32)
Calcium: 9.2 mg/dL (ref 8.9–10.3)
Chloride: 109 mmol/L (ref 98–111)
Creatinine, Ser: 0.57 mg/dL (ref 0.44–1.00)
GFR, Estimated: >60 mL/min (ref >60)
Glucose, Bld: 87 mg/dL (ref 70–99)
Potassium: 4 mmol/L (ref 3.5–5.1)
Sodium: 140 mmol/L (ref 135–145)

## 2021-11-12 LAB — HCG, SERUM, QUALITATIVE: Preg, Serum: NEGATIVE

## 2021-11-12 MED ORDER — ONDANSETRON HCL 4 MG/2ML IJ SOLN
4.0000 mg | Freq: Once | INTRAMUSCULAR | Status: AC
Start: 2021-11-12 — End: 2021-11-12
  Administered 2021-11-12: 4 mg via INTRAVENOUS
  Filled 2021-11-12: qty 2

## 2021-11-12 MED ORDER — SODIUM CHLORIDE 0.9 % IV BOLUS
2000.0000 mL | Freq: Once | INTRAVENOUS | Status: AC
  Administered 2021-11-12: 2000 mL via INTRAVENOUS

## 2021-11-12 MED ORDER — METOCLOPRAMIDE HCL 5 MG/ML IJ SOLN: 10.0000 mg | Freq: Once | INTRAMUSCULAR | Status: DC | PRN

## 2021-11-12 MED ORDER — ONDANSETRON 8 MG PO TBDP: 8.0000 mg | ORAL_TABLET | Freq: Three times a day (TID) | ORAL | 0 refills | Status: DC | PRN

## 2021-11-12 NOTE — ED Triage Notes (Signed)
Presented from home with c/o n/v/d x1 week. Denies fever at home.  ?

## 2021-11-12 NOTE — ED Provider Notes (Signed)
? ?[facility] ?Provider Note: Georgena Spurling, MD, [place] ? ?CSN: [id number] ?MRN: [id number] ?ARRIVAL: 11/12/21 at 0501 ?ROOM: [facility]06/[facility]06 ? ? ?CHIEF COMPLAINT  ?Vomiting ? ? ?HISTORY OF PRESENT ILLNESS  ?11/12/21 5:16 AM ?Tricia Campbell is a 31 y.o. female who has had nausea and some generalized weakness for about a week.  She began having nausea, vomiting and diarrhea yesterday evening about 9 PM.  She states she has lost a lot of fluid.  She feels weak and has no appetite.  She she was noted to be tachycardic but she states her heartbeat is always high.  She denies abdominal pain apart from some muscle soreness due to vomiting.  She is having body pain which she describes as a 10 out of 10.  She has not had a fever. ? ? ?Past Medical History:  ?Diagnosis Date  ? Vaginal delivery   ? ? ?Past Surgical History:  ?Procedure Laterality Date  ? APPENDECTOMY    ? LAPAROSCOPIC APPENDECTOMY N/A 07/23/2018  ? Procedure: APPENDECTOMY LAPAROSCOPIC;  Surgeon: Alphonsa Overall, MD;  Location: [facility];  Service: General;  Laterality: N/A;  ? WISDOM TOOTH EXTRACTION    ? ? ?Family History  ?Problem Relation Age of Onset  ? Asthma Neg Hx   ? Diabetes Neg Hx   ? Heart disease Neg Hx   ? Hypertension Neg Hx   ? Kidney disease Neg Hx   ? Stroke Neg Hx   ? ? ?Social History  ? ?Tobacco Use  ? Smoking status: Some Days  ?  Packs/day: 0.20  ?  Types: Cigars, Cigarettes  ?  Last attempt to quit: 12/05/2015  ?  Years since quitting: 5.9  ? Smokeless tobacco: Never  ? Tobacco comments:  ?  Black and milds some days  ?Substance Use Topics  ? Alcohol use: No  ? Drug use: No  ? ? ?Prior to Admission medications   ?Medication Sig Start Date End Date Taking? Authorizing Provider  ?ondansetron (ZOFRAN-ODT) 8 MG disintegrating tablet Take 1 tablet (8 mg total) by mouth every 8 (eight) hours as needed for nausea or vomiting. 11/12/21  Yes Averee Harb, Jenny Reichmann, MD  ? ? ?Allergies ?Latex ? ? ?REVIEW OF SYSTEMS  ?Negative except as noted here or in  the History of Present Illness. ? ? ?PHYSICAL EXAMINATION  ?Initial Vital Signs ?Blood pressure 113/66, pulse (!) 113, temperature 98.2 ?F (36.8 ?C), temperature source Oral, resp. rate 16, height '5\' 6"'$  (1.676 m), weight 59 kg, SpO2 100 %. ? ?Examination ?General: Well-developed, well-nourished female in no acute distress; appearance consistent with age of record ?HENT: normocephalic; atraumatic ?Eyes: Normal appearance ?Neck: supple ?Heart: regular rate and rhythm; tachycardia ?Lungs: clear to auscultation bilaterally ?Abdomen: soft; nondistended; nontender; no masses or hepatosplenomegaly; bowel sounds hypoactive ?Extremities: No deformity; full range of motion; pulses normal ?Neurologic: Awake, alert and oriented; motor function intact in all extremities and symmetric; no facial droop ?Skin: Warm and dry ?Psychiatric: Normal mood and affect ? ? ?RESULTS  ?Summary of this visit's results, reviewed and interpreted by myself: ? ? EKG Interpretation ? ?Date/Time:    ?Ventricular Rate:    ?PR Interval:    ?QRS Duration:   ?QT Interval:    ?QTC Calculation:   ?R Axis:     ?Text Interpretation:   ?  ? ?  ? ?Laboratory Studies: ?Results for orders placed or performed during the hospital encounter of 11/12/21 (from the past 24 hour(s))  ?CBC with Differential/Platelet     Status:  Abnormal  ? Collection Time: 11/12/21  5:35 AM  ?Result Value Ref Range  ? WBC 10.3 4.0 - 10.5 K/uL  ? RBC 4.30 3.87 - 5.11 MIL/uL  ? Hemoglobin 12.5 12.0 - 15.0 g/dL  ? HCT 38.4 36.0 - 46.0 %  ? MCV 89.3 80.0 - 100.0 fL  ? MCH 29.1 26.0 - 34.0 pg  ? MCHC 32.6 30.0 - 36.0 g/dL  ? RDW 12.4 11.5 - 15.5 %  ? Platelets 249 150 - 400 K/uL  ? nRBC 0.0 0.0 - 0.2 %  ? Neutrophils Relative % 89 %  ? Neutro Abs 9.1 (H) 1.7 - 7.7 K/uL  ? Lymphocytes Relative 4 %  ? Lymphs Abs 0.5 (L) 0.7 - 4.0 K/uL  ? Monocytes Relative 7 %  ? Monocytes Absolute 0.7 0.1 - 1.0 K/uL  ? Eosinophils Relative 0 %  ? Eosinophils Absolute 0.0 0.0 - 0.5 K/uL  ? Basophils Relative 0  %  ? Basophils Absolute 0.0 0.0 - 0.1 K/uL  ? Immature Granulocytes 0 %  ? Abs Immature Granulocytes 0.02 0.00 - 0.07 K/uL  ?Basic metabolic panel     Status: Abnormal  ? Collection Time: 11/12/21  5:35 AM  ?Result Value Ref Range  ? Sodium 140 135 - 145 mmol/L  ? Potassium 4.0 3.5 - 5.1 mmol/L  ? Chloride 109 98 - 111 mmol/L  ? CO2 21 (L) 22 - 32 mmol/L  ? Glucose, Bld 87 70 - 99 mg/dL  ? BUN 13 6 - 20 mg/dL  ? Creatinine, Ser 0.57 0.44 - 1.00 mg/dL  ? Calcium 9.2 8.9 - 10.3 mg/dL  ? GFR, Estimated >60 >60 mL/min  ? Anion gap 10 5 - 15  ?hCG, serum, qualitative     Status: None  ? Collection Time: 11/12/21  5:35 AM  ?Result Value Ref Range  ? Preg, Serum NEGATIVE NEGATIVE  ? ?Imaging Studies: ?No results found. ? ?ED COURSE and MDM  ?Nursing notes, initial and subsequent vitals signs, including pulse oximetry, reviewed and interpreted by myself. ? ?Vitals:  ? 11/12/21 0512 11/12/21 0513 11/12/21 0530 11/12/21 0600  ?BP: 113/66  120/72 127/70  ?Pulse: (!) 113  95 (!) 102  ?Resp: '16  18 18  '$ ?Temp: 98.2 ?F (36.8 ?C)     ?TempSrc: Oral     ?SpO2: 100%  100% 100%  ?Weight:  59 kg    ?Height:  '5\' 6"'$  (1.676 m)    ? ?Medications  ?metoCLOPramide (REGLAN) injection 10 mg (has no administration in time range)  ?sodium chloride 0.9 % bolus 2,000 mL (2,000 mLs Intravenous New Bag/Given 11/12/21 0540)  ?ondansetron [facility]) injection 4 mg (4 mg Intravenous Given 11/12/21 0532)  ? ?6:37 AM ?Patient feeling better, able to drink fluids without vomiting after IV fluids and Zofran.  Patient did not need Reglan.  Tachycardia improved with hydration.  Presentation could be due to viral gastroenteritis, food poisoning or other environmental exposure.  Lab results are reassuring.  If symptoms persist or recur she should return or contact her PCP. ? ? ?PROCEDURES  ?Procedures ? ? ?ED DIAGNOSES  ? ?  ICD-10-CM   ?1. Gastroenteritis  K52.9   ?  ? ? ? ?  ?Shanon Rosser, MD ?11/12/21 [phone] ? ?

## 2021-11-20 ENCOUNTER — Ambulatory Visit (INDEPENDENT_AMBULATORY_CARE_PROVIDER_SITE_OTHER): Payer: Medicaid Other

## 2021-11-20 ENCOUNTER — Other Ambulatory Visit (HOSPITAL_COMMUNITY)
Admission: RE | Admit: 2021-11-20 | Discharge: 2021-11-20 | Disposition: A | Discharge status: Home / Self Care | Payer: Medicaid Other | Source: Ambulatory Visit | Attending: Family Medicine | Admitting: Family Medicine

## 2021-11-20 VITALS — BP 113/74 | HR 91 | Ht 66.0 in | Wt 122.7 lb

## 2021-11-20 DIAGNOSIS — Z113 Encounter for screening for infections with a predominantly sexual mode of transmission: Secondary | ICD-10-CM

## 2021-11-20 MED ORDER — IBUPROFEN 600 MG PO TABS: 600.0000 mg | ORAL_TABLET | Freq: Four times a day (QID) | ORAL | 1 refills | Status: DC | PRN

## 2021-11-20 NOTE — Progress Notes (Signed)
Tricia Campbell here for Depo-Provera Injection. Had unprotected intercourse x 3 days ago. Pt aware of policy of waiting 2 weeks with protected and/or abstinence and then coming back for restart of Depo. LMP 11/14/2021. Last Depo injection was 07/27/21. Pt asked for Rx Ibuprofen for cramps with cycle. Rx sent to pharmacy on file.  ? ?Pt states would like to do self swab today for STD testing. Pt denies any exposure to STDs at this time.  ?Self swab collected today. Pt advised results will take 24-48 hours and will see results in mychart and will be notified if needs further treatment.  Pt verbalized understanding.  ? ?Next nurse visit scheduled for 5/1 at 10am. Pt agreeable to date and time of appt.  ? ?Colletta Maryland, RN  ? ?

## 2021-11-21 ENCOUNTER — Other Ambulatory Visit: Payer: Self-pay | Admitting: Obstetrics & Gynecology

## 2021-11-21 DIAGNOSIS — B9689 Other specified bacterial agents as the cause of diseases classified elsewhere: Secondary | ICD-10-CM

## 2021-11-21 LAB — CERVICOVAGINAL ANCILLARY ONLY
Bacterial Vaginitis (gardnerella): POSITIVE — AB
Candida Glabrata: NEGATIVE
Candida Vaginitis: NEGATIVE
Chlamydia: NEGATIVE
Comment: NEGATIVE
Comment: NEGATIVE
Comment: NEGATIVE
Comment: NEGATIVE
Comment: NEGATIVE
Comment: NORMAL
Neisseria Gonorrhea: NEGATIVE
Trichomonas: NEGATIVE

## 2021-11-21 MED ORDER — METRONIDAZOLE 500 MG PO TABS: 500.0000 mg | ORAL_TABLET | Freq: Two times a day (BID) | ORAL | 0 refills | Status: AC

## 2021-12-04 ENCOUNTER — Ambulatory Visit: Payer: Medicaid Other

## 2021-12-20 ENCOUNTER — Ambulatory Visit: Payer: Medicaid Other

## 2022-01-11 ENCOUNTER — Ambulatory Visit: Payer: Medicaid Other

## 2022-02-12 ENCOUNTER — Ambulatory Visit: Payer: Medicaid Other

## 2022-04-10 ENCOUNTER — Other Ambulatory Visit: Payer: Self-pay | Admitting: Obstetrics & Gynecology

## 2022-04-22 ENCOUNTER — Encounter (HOSPITAL_COMMUNITY): Payer: Self-pay

## 2022-04-22 ENCOUNTER — Other Ambulatory Visit: Payer: Self-pay

## 2022-04-22 ENCOUNTER — Inpatient Hospital Stay (HOSPITAL_COMMUNITY)
Admission: AD | Admit: 2022-04-22 | Discharge: 2022-04-22 | Disposition: A | Discharge status: Home / Self Care | Payer: Medicaid Other | Attending: Obstetrics & Gynecology | Admitting: Obstetrics & Gynecology

## 2022-04-22 ENCOUNTER — Inpatient Hospital Stay (HOSPITAL_COMMUNITY): Payer: Medicaid Other

## 2022-04-22 DIAGNOSIS — Z3A01 Less than 8 weeks gestation of pregnancy: Secondary | ICD-10-CM | POA: Insufficient documentation

## 2022-04-22 DIAGNOSIS — F17290 Nicotine dependence, other tobacco product, uncomplicated: Secondary | ICD-10-CM | POA: Insufficient documentation

## 2022-04-22 DIAGNOSIS — O99331 Smoking (tobacco) complicating pregnancy, first trimester: Secondary | ICD-10-CM | POA: Insufficient documentation

## 2022-04-22 DIAGNOSIS — O30001 Twin pregnancy, unspecified number of placenta and unspecified number of amniotic sacs, first trimester: Secondary | ICD-10-CM

## 2022-04-22 DIAGNOSIS — O30041 Twin pregnancy, dichorionic/diamniotic, first trimester: Secondary | ICD-10-CM

## 2022-04-22 DIAGNOSIS — O219 Vomiting of pregnancy, unspecified: Secondary | ICD-10-CM

## 2022-04-22 DIAGNOSIS — O99320 Drug use complicating pregnancy, unspecified trimester: Secondary | ICD-10-CM

## 2022-04-22 DIAGNOSIS — Z3490 Encounter for supervision of normal pregnancy, unspecified, unspecified trimester: Secondary | ICD-10-CM

## 2022-04-22 LAB — URINALYSIS, ROUTINE W REFLEX MICROSCOPIC
Bilirubin Urine: NEGATIVE
Glucose, UA: NEGATIVE mg/dL
Hgb urine dipstick: NEGATIVE
Ketones, ur: NEGATIVE mg/dL
Leukocytes,Ua: NEGATIVE
Nitrite: NEGATIVE
Protein, ur: NEGATIVE mg/dL
Specific Gravity, Urine: 1.016 (ref 1.005–1.030)
pH: 6 (ref 5.0–8.0)

## 2022-04-22 LAB — BASIC METABOLIC PANEL
Anion gap: 8 (ref 5–15)
BUN: 7 mg/dL (ref 6–20)
CO2: 21 mmol/L — ABNORMAL LOW (ref 22–32)
Calcium: 9.3 mg/dL (ref 8.9–10.3)
Chloride: 105 mmol/L (ref 98–111)
Creatinine, Ser: 0.7 mg/dL (ref 0.44–1.00)
GFR, Estimated: >60 mL/min (ref >60)
Glucose, Bld: 113 mg/dL — ABNORMAL HIGH (ref 70–99)
Potassium: 3.9 mmol/L (ref 3.5–5.1)
Sodium: 134 mmol/L — ABNORMAL LOW (ref 135–145)

## 2022-04-22 LAB — CBC
HCT: 36.2 % (ref 36.0–46.0)
Hemoglobin: 12.5 g/dL (ref 12.0–15.0)
MCH: 29.6 pg (ref 26.0–34.0)
MCHC: 34.5 g/dL (ref 30.0–36.0)
MCV: 85.8 fL (ref 80.0–100.0)
Platelets: 265 10*3/uL (ref 150–400)
RBC: 4.22 MIL/uL (ref 3.87–5.11)
RDW: 13.4 % (ref 11.5–15.5)
WBC: 7.6 10*3/uL (ref 4.0–10.5)
nRBC: 0 % (ref 0.0–0.2)

## 2022-04-22 LAB — HCG, QUANTITATIVE, PREGNANCY: hCG, Beta Chain, Quant, S: 78357 m[IU]/mL — ABNORMAL HIGH (ref <5)

## 2022-04-22 LAB — WET PREP, GENITAL
Clue Cells Wet Prep HPF POC: NONE SEEN
Sperm: NONE SEEN
Trich, Wet Prep: NONE SEEN
WBC, Wet Prep HPF POC: <10 (ref <10)
Yeast Wet Prep HPF POC: NONE SEEN

## 2022-04-22 LAB — POCT PREGNANCY, URINE: Preg Test, Ur: POSITIVE — AB

## 2022-04-22 MED ORDER — ACETAMINOPHEN 325 MG PO TABS: 650.0000 mg | ORAL_TABLET | ORAL | 0 refills | Status: AC | PRN

## 2022-04-22 MED ORDER — PROMETHAZINE HCL 12.5 MG PO TABS
12.5000 mg | ORAL_TABLET | Freq: Four times a day (QID) | ORAL | 0 refills | Status: DC | PRN
Start: 2022-04-22 — End: 2022-07-20

## 2022-04-22 NOTE — Discharge Instructions (Signed)
Vomiting in First Trimester Follow these instructions at home: To help relieve your symptoms, listen to your body. Everyone is different and has different preferences. Find what works best for you. Here are some things you can try to help relieve your symptoms: Meals and snacks Eat 5-6 small meals daily instead of 3 large meals. Eating small meals and snacks can help you avoid an empty stomach. Before getting out of bed, eat a couple of crackers to avoid moving around on an empty stomach. Eat a protein-rich snack before bed. Examples include cheese and crackers, or a peanut butter sandwich made with 1 slice of whole-wheat bread and 1 tsp (5 g) of peanut butter. Eat and drink slowly. Try eating starchy foods as these are usually tolerated well. Examples include cereal, toast, bread, potatoes, pasta, rice, and pretzels. Eat at least one serving of protein with your meals and snacks. Protein options include lean meats, poultry, seafood, beans, nuts, nut butters, eggs, cheese, and yogurt. Eat or suck on things that have ginger in them. It may help to relieve nausea. Add  tsp (0.44 g) ground ginger to hot tea, or choose ginger tea.   Fluids It is important to stay hydrated. Try to: Drink small amounts of fluids often. Drink fluids 30 minutes before or after a meal to help lessen the feeling of a full stomach. Drink 100% fruit juice or an electrolyte drink. An electrolyte drink contains sodium, potassium, and chloride. Drink fluids that are cold, clear, and carbonated or sour. These include lemonade, ginger ale, lemon-lime soda, ice water, and sparkling water. Things to avoid Avoid the following: Eating foods that trigger your symptoms. These may include spicy foods, coffee, high-fat foods, very sweet foods, and acidic foods. Drinking more than 1 cup of fluid at a time. Skipping meals. Nausea can be more intense on an empty stomach. If you cannot tolerate food, do not force it. Try sucking on ice  chips or other frozen items and make up for missed calories later. Lying down within 2 hours after eating. Being exposed to environmental triggers. These may include food smells, smoky rooms, closed spaces, rooms with strong smells, warm or humid places, overly loud and noisy rooms, and rooms with motion or flickering lights. Try eating meals in a well-ventilated area that is free of strong smells. Making quick and sudden changes in your movement. Taking iron pills and multivitamins that contain iron. If you take prescription iron pills, do not stop taking them unless your health care provider approves. Preparing food. The smell of food can spoil your appetite or trigger nausea. General instructions Brush your teeth or use a mouth rinse after meals. Take over-the-counter and prescription medicines only as told by your health care provider. Follow instructions from your health care provider about eating or drinking restrictions. Talk with your health care provider about starting a supplement of vitamin B6. Continue to take your prenatal vitamins as told by your health care provider. If you are having trouble taking your prenatal vitamins, talk with your health care provider about other options. Keep all follow-up visits. This is important. Follow-up visits include prenatal visits. Contact a health care provider if: You have pain in your abdomen. You have a severe headache. You have vision problems. You are losing weight. You feel weak or dizzy. You cannot eat or drink without vomiting, especially if this goes on for a full day. Get help right away if: You cannot drink fluids without vomiting. You vomit blood. You have constant   nausea and vomiting. You are very weak. You faint. You have a fever and your symptoms suddenly get worse. Summary Making some changes to your eating habits may help relieve nausea and vomiting. This condition may be managed with lifestyle changes and medicines as  prescribed by your health care provider. If medicines do not help relieve nausea and vomiting, you may need to receive fluids through an IV at the hospital. This information is not intended to replace advice given to you by your health care provider. Make sure you discuss any questions you have with your health care provider. Document Revised: 02/15/2020 Document Reviewed: 02/15/2020 Elsevier Patient Education  2021 [employer]  Safe Medications in Pregnancy   Acne: Benzoyl Peroxide Salicylic Acid  Backache/Headache: Tylenol: 2 regular strength every 4 hours OR              2 Extra strength every 6 hours  Colds/Coughs/Allergies: Benadryl (alcohol free) 25 mg every 6 hours as needed Breath right strips Claritin Cepacol throat lozenges Chloraseptic throat spray Cold-Eeze- up to three times per day Cough drops, alcohol free Flonase (by prescription only) Guaifenesin Mucinex Robitussin DM (plain only, alcohol free) Saline nasal spray/drops Sudafed (pseudoephedrine) & Actifed ** use only after [redacted] weeks gestation and if you do not have high blood pressure Tylenol Vicks Vaporub Zinc lozenges Zyrtec   Constipation: Colace Ducolax suppositories Fleet enema Glycerin suppositories Metamucil Milk of magnesia Miralax Senokot Smooth move tea  Diarrhea: Kaopectate Imodium A-D  *NO pepto Bismol  Hemorrhoids: Anusol Anusol HC Preparation H Tucks  Indigestion: Tums Maalox Mylanta Zantac  Pepcid  Insomnia: Benadryl (alcohol free) 25mg every 6 hours as needed Tylenol PM Unisom, no Gelcaps  Leg Cramps: Tums MagGel  Nausea/Vomiting:  Bonine Dramamine Emetrol Ginger extract Sea bands Meclizine  Nausea medication to take during pregnancy:  Unisom (doxylamine succinate 25 mg tablets) Take one tablet daily at bedtime. If symptoms are not adequately controlled, the dose can be increased to a maximum recommended dose of two tablets daily (1/2 tablet in the  morning, 1/2 tablet mid-afternoon and one at bedtime). Vitamin B6 100mg tablets. Take one tablet twice a day (up to 200 mg per day).  Skin Rashes: Aveeno products Benadryl cream or 25mg every 6 hours as needed Calamine Lotion 1% cortisone cream  Yeast infection: Gyne-lotrimin 7 Monistat 7   **If taking multiple medications, please check labels to avoid duplicating the same active ingredients **take medication as directed on the label ** Do not exceed 4000 mg of tylenol in 24 hours **Do not take medications that contain aspirin or ibuprofen     

## 2022-04-22 NOTE — MAU Provider Note (Signed)
History     CSN: [id number]  Arrival date and time: 04/22/22 0815   Event Date/Time   First Provider Initiated Contact with Patient 04/22/22 [id number]      Chief Complaint  Patient presents with   Nausea   HPI Tricia Campbell is a 31 y.o. G4P1021 in early pregnancy who presents to MAU with chief complaints of nausea, vomiting and generally feeling unwell. These are recurrent problems, onset last week. She denies fever, dysuria, vaginal bleeding, recent illness. On arrival to MAU patient reports she "is dying" and "can't eat anything". She is eating small bag of chips during initial MAU encounter. She attempted potatoes, steak and gravy last night but vomited afterwards. She reports THC use yesterday.   Patient also reports generalized abdominal pain across her torso at the level of her umbilicus. Pain score is 2/10. She denies aggravating or alleviating factors. She has effectively managed this pain with Ibuprofen but discontinued use yesterday upon learning she was pregnant.  OB History     Gravida  4   Para  1   Term  1   Preterm  0   AB  2   Living  1      SAB  0   IAB  2   Ectopic  0   Multiple  0   Live Births  1           Past Medical History:  Diagnosis Date   Vaginal delivery     Past Surgical History:  Procedure Laterality Date   APPENDECTOMY     LAPAROSCOPIC APPENDECTOMY N/A 07/23/2018   Procedure: APPENDECTOMY LAPAROSCOPIC;  Surgeon: Alphonsa Overall, MD;  Location: [facility];  Service: General;  Laterality: N/A;   WISDOM TOOTH EXTRACTION      Family History  Problem Relation Age of Onset   Asthma Neg Hx    Diabetes Neg Hx    Heart disease Neg Hx    Hypertension Neg Hx    Kidney disease Neg Hx    Stroke Neg Hx     Social History   Tobacco Use   Smoking status: Some Days    Packs/day: 0.20    Types: Cigars, Cigarettes    Last attempt to quit: 12/05/2015    Years since quitting: 6.3   Smokeless tobacco: Never   Tobacco comments:     Black and milds some days  Substance Use Topics   Alcohol use: No   Drug use: No    Allergies:  Allergies  Allergen Reactions   Latex Rash    No medications prior to admission.    Review of Systems  Constitutional:  Positive for fatigue.  Gastrointestinal:  Positive for abdominal pain, nausea and vomiting.  All other systems reviewed and are negative.  Physical Exam   Blood pressure (!) 130/59, pulse 97, temperature 98.3 F (36.8 C), temperature source Oral, resp. rate 16, height '5\' 6"'$  (1.676 m), weight 56.8 kg, SpO2 99 %.  Physical Exam Vitals and nursing note reviewed. Exam conducted with a chaperone present.  Constitutional:      General: She is not in acute distress.    Appearance: Normal appearance. She is not ill-appearing.  Cardiovascular:     Rate and Rhythm: Normal rate.  Pulmonary:     Effort: Pulmonary effort is normal.  Abdominal:     General: Abdomen is flat.     Tenderness: There is no abdominal tenderness.  Skin:    Capillary Refill: Capillary refill takes less than 2  seconds.  Neurological:     Mental Status: She is alert and oriented to person, place, and time.  Psychiatric:        Mood and Affect: Mood normal.        Behavior: Behavior normal.        Thought Content: Thought content normal.        Judgment: Judgment normal.     MAU Course  Procedures  MDM  Orders Placed This Encounter  Procedures   Wet prep, genital   US OB LESS THAN 14 WEEKS WITH OB TRANSVAGINAL   US OB Comp AddL Gest Less 14 Wks   Urinalysis, Routine w reflex microscopic Urine, Clean Catch   CBC   Basic metabolic panel   hCG, quantitative, pregnancy   Nursing communication   Pregnancy, urine POC   Discharge patient   Patient Vitals for the past 24 hrs:  BP Temp Temp src Pulse Resp SpO2 Height Weight  04/22/22 0836 (!) 130/59 98.3 F (36.8 C) Oral 97 16 99 % -- --  04/22/22 0832 -- -- -- -- -- -- '5\' 6"'$  (1.676 m) 56.8 kg   Results for orders placed or  performed during the hospital encounter of 04/22/22 (from the past 24 hour(s))  Pregnancy, urine POC     Status: Abnormal   Collection Time: 04/22/22  8:27 AM  Result Value Ref Range   Preg Test, Ur POSITIVE (A) NEGATIVE  Wet prep, genital     Status: None   Collection Time: 04/22/22  8:49 AM  Result Value Ref Range   Yeast Wet Prep HPF POC NONE SEEN NONE SEEN   Trich, Wet Prep NONE SEEN NONE SEEN   Clue Cells Wet Prep HPF POC NONE SEEN NONE SEEN   WBC, Wet Prep HPF POC <10 <10   Sperm NONE SEEN   CBC     Status: None   Collection Time: 04/22/22  8:57 AM  Result Value Ref Range   WBC 7.6 4.0 - 10.5 K/uL   RBC 4.22 3.87 - 5.11 MIL/uL   Hemoglobin 12.5 12.0 - 15.0 g/dL   HCT 36.2 36.0 - 46.0 %   MCV 85.8 80.0 - 100.0 fL   MCH 29.6 26.0 - 34.0 pg   MCHC 34.5 30.0 - 36.0 g/dL   RDW 13.4 11.5 - 15.5 %   Platelets 265 150 - 400 K/uL   nRBC 0.0 0.0 - 0.2 %  Basic metabolic panel     Status: Abnormal   Collection Time: 04/22/22  8:57 AM  Result Value Ref Range   Sodium 134 (L) 135 - 145 mmol/L   Potassium 3.9 3.5 - 5.1 mmol/L   Chloride 105 98 - 111 mmol/L   CO2 21 (L) 22 - 32 mmol/L   Glucose, Bld 113 (H) 70 - 99 mg/dL   BUN 7 6 - 20 mg/dL   Creatinine, Ser 0.70 0.44 - 1.00 mg/dL   Calcium 9.3 8.9 - 10.3 mg/dL   GFR, Estimated >60 >60 mL/min   Anion gap 8 5 - 15  Urinalysis, Routine w reflex microscopic Urine, Clean Catch     Status: Abnormal   Collection Time: 04/22/22  9:09 AM  Result Value Ref Range   Color, Urine YELLOW YELLOW   APPearance HAZY (A) CLEAR   Specific Gravity, Urine 1.016 1.005 - 1.030   pH 6.0 5.0 - 8.0   Glucose, UA NEGATIVE NEGATIVE mg/dL   Hgb urine dipstick NEGATIVE NEGATIVE   Bilirubin Urine NEGATIVE NEGATIVE  Ketones, ur NEGATIVE NEGATIVE mg/dL   Protein, ur NEGATIVE NEGATIVE mg/dL   Nitrite NEGATIVE NEGATIVE   Leukocytes,Ua NEGATIVE NEGATIVE   US OB LESS THAN 14 WEEKS WITH OB TRANSVAGINAL  Result Date: 04/22/2022 CLINICAL DATA:  Pelvic pain.  Pregnant patient. Uncertain last menstrual period. EXAM: TWIN OBSTETRIC <14WK [place] AND TRANSVAGINAL OB US TECHNIQUE: Both transabdominal and transvaginal ultrasound examinations were performed for complete evaluation of the gestation as well as the maternal uterus, adnexal regions, and pelvic cul-de-sac. Transvaginal technique was performed to assess early pregnancy. COMPARISON:  None Available. FINDINGS: Number of IUPs:  2 Chorionicity/Amnionicity:  Dichorionic/diamniotic. TWIN 1 Yolk sac:  Visualized. Embryo:  Visualized. Cardiac Activity: Visualized. Heart Rate: 119 bpm CRL:  4.55 mm   6 w 1 d                  [place] EDC: 12/15/2022 TWIN 2 Yolk sac:  Visualized. Embryo:  Visualized. Cardiac Activity: Visualized. Heart Rate: 116 bpm CRL:  4.94 mm   6 w 1 d                  [place] EDC: 12/15/2022 Subchorionic hemorrhage:  Minimal subcoracoid hemorrhage. Maternal uterus/adnexae: No uterine masses. Normal ovaries and adnexa. No pelvic free fluid. IMPRESSION: 1. Live, twin intrauterine gestation, dichorionic/diamniotic, with symmetric embryo sizes. Minimal subchorionic hemorrhage. No other pregnancy complication. Electronically Signed   By: Lajean Manes M.D.   On: 04/22/2022 10:08   US OB Comp AddL Gest Less 14 Wks  Result Date: 04/22/2022 CLINICAL DATA:  Pelvic pain. Pregnant patient. Uncertain last menstrual period. EXAM: TWIN OBSTETRIC <14WK [place] AND TRANSVAGINAL OB US TECHNIQUE: Both transabdominal and transvaginal ultrasound examinations were performed for complete evaluation of the gestation as well as the maternal uterus, adnexal regions, and pelvic cul-de-sac. Transvaginal technique was performed to assess early pregnancy. COMPARISON:  None Available. FINDINGS: Number of IUPs:  2 Chorionicity/Amnionicity:  Dichorionic/diamniotic. TWIN 1 Yolk sac:  Visualized. Embryo:  Visualized. Cardiac Activity: Visualized. Heart Rate: 119 bpm CRL:  4.55 mm   6 w 1 d                  [place] EDC: 12/15/2022 TWIN 2 Yolk sac:  Visualized.  Embryo:  Visualized. Cardiac Activity: Visualized. Heart Rate: 116 bpm CRL:  4.94 mm   6 w 1 d                  [place] EDC: 12/15/2022 Subchorionic hemorrhage:  Minimal subcoracoid hemorrhage. Maternal uterus/adnexae: No uterine masses. Normal ovaries and adnexa. No pelvic free fluid. IMPRESSION: 1. Live, twin intrauterine gestation, dichorionic/diamniotic, with symmetric embryo sizes. Minimal subchorionic hemorrhage. No other pregnancy complication. Electronically Signed   By: Lajean Manes M.D.   On: 04/22/2022 10:08    Meds ordered this encounter  Medications   acetaminophen (TYLENOL) 325 MG tablet    Sig: Take 2 tablets (650 mg total) by mouth every 4 (four) hours as needed.    Dispense:  180 tablet    Refill:  0    Order Specific Question:   Supervising Provider    Answer:   Tania Ade H [[id number]]   promethazine (PHENERGAN) 12.5 MG tablet    Sig: Take 1 tablet (12.5 mg total) by mouth every 6 (six) hours as needed for nausea or vomiting.    Dispense:  30 tablet    Refill:  0    Order Specific Question:   Supervising Provider    Answer:   Tania Ade  H [[id number]]   Assessment and Plan  --31 y.o. G4P1021 at 44w1dwith di/di twin gestation --Well appearing, tolerating PO intake during MAU encounter --Vomiting without ketonuria 2/2 patient report of THC use yesterday --Preemptive discussion Cannabis Hyperemesis, bland grazing to reduce vomiting frequency --"Minimal" [place] not currently bleeding, pelvic rest advised --Discharge home in stable condition  F/U: --Patient [facility] Encouraged to call tomorrow to schedule New OB  SDarlina Rumpf MSt. Edward MSN, CNM 04/22/2022, 10:26 AM

## 2022-04-22 NOTE — MAU Note (Signed)
Tricia Campbell is a 31 y.o. here in MAU reporting: nausea, emesis, and decreased appetite. Had positive upt yesterday. Also intermittent lower abdominal pain.  Pt currently eating chips and drinking ginger ale in triage.  LMP: unknown, got off of depo in march and has had irregular periods since then, had some spotting about 2 months ago  Onset of complaint: ongoing  Pain score: 2/10  Vitals:   04/22/22 0836  BP: (!) 130/59  Pulse: 97  Resp: 16  Temp: 98.3 F (36.8 C)  SpO2: 99%     Lab orders placed from triage: upt, ua

## 2022-04-23 LAB — GC/CHLAMYDIA PROBE AMP (~~LOC~~) NOT AT ARMC
Chlamydia: NEGATIVE
Comment: NEGATIVE
Comment: NORMAL
Neisseria Gonorrhea: NEGATIVE

## 2022-04-25 ENCOUNTER — Encounter: Payer: Self-pay | Admitting: Family Medicine

## 2022-05-02 ENCOUNTER — Encounter: Payer: Self-pay | Admitting: Family Medicine

## 2022-05-07 ENCOUNTER — Telehealth: Payer: Self-pay | Admitting: Family Medicine

## 2022-05-07 NOTE — Telephone Encounter (Signed)
Call placed back to pt. Pt states having nausea all during day. Pt states has not been eating well since she doesn't feel like eating. She is currently 8+[redacted] weeks pregnant. She has a Rx for Phenergan at home that she uses at night. Pt only states vomiting ~ 2 times a day. I have sent a safe meds list to her mychart to maybe just over the counter first before to see if that helps.Pt verbalized understanding. Pt has new OB appt on 10/11.  Colletta Maryland, RNC

## 2022-05-07 NOTE — Telephone Encounter (Signed)
Patient is requesting a RX for nausea

## 2022-05-16 ENCOUNTER — Telehealth (INDEPENDENT_AMBULATORY_CARE_PROVIDER_SITE_OTHER): Payer: Medicaid Other

## 2022-05-16 ENCOUNTER — Inpatient Hospital Stay (HOSPITAL_COMMUNITY)
Admission: AD | Admit: 2022-05-16 | Discharge: 2022-05-16 | Disposition: A | Discharge status: Home / Self Care | Payer: Medicaid Other | Attending: Obstetrics and Gynecology | Admitting: Obstetrics and Gynecology

## 2022-05-16 ENCOUNTER — Other Ambulatory Visit: Payer: Self-pay

## 2022-05-16 ENCOUNTER — Encounter (HOSPITAL_COMMUNITY): Payer: Self-pay | Admitting: Obstetrics and Gynecology

## 2022-05-16 DIAGNOSIS — M940 Chondrocostal junction syndrome [Tietze]: Secondary | ICD-10-CM

## 2022-05-16 DIAGNOSIS — Z3A09 9 weeks gestation of pregnancy: Secondary | ICD-10-CM | POA: Diagnosis not present

## 2022-05-16 DIAGNOSIS — O26891 Other specified pregnancy related conditions, first trimester: Secondary | ICD-10-CM | POA: Diagnosis present

## 2022-05-16 DIAGNOSIS — O30041 Twin pregnancy, dichorionic/diamniotic, first trimester: Secondary | ICD-10-CM | POA: Insufficient documentation

## 2022-05-16 DIAGNOSIS — Z3480 Encounter for supervision of other normal pregnancy, unspecified trimester: Secondary | ICD-10-CM

## 2022-05-16 DIAGNOSIS — O0990 Supervision of high risk pregnancy, unspecified, unspecified trimester: Secondary | ICD-10-CM | POA: Insufficient documentation

## 2022-05-16 DIAGNOSIS — O99891 Other specified diseases and conditions complicating pregnancy: Secondary | ICD-10-CM | POA: Insufficient documentation

## 2022-05-16 DIAGNOSIS — Z3689 Encounter for other specified antenatal screening: Secondary | ICD-10-CM

## 2022-05-16 LAB — URINALYSIS, ROUTINE W REFLEX MICROSCOPIC
Bilirubin Urine: NEGATIVE
Glucose, UA: NEGATIVE mg/dL
Hgb urine dipstick: NEGATIVE
Ketones, ur: NEGATIVE mg/dL
Leukocytes,Ua: NEGATIVE
Nitrite: NEGATIVE
Protein, ur: NEGATIVE mg/dL
Specific Gravity, Urine: 1.025 (ref 1.005–1.030)
pH: 5 (ref 5.0–8.0)

## 2022-05-16 MED ORDER — BLOOD PRESSURE KIT DEVI: 1.0000 | 0 refills | Status: DC | PRN

## 2022-05-16 MED ORDER — GOJJI WEIGHT SCALE MISC: 1.0000 | 0 refills | Status: DC | PRN

## 2022-05-16 MED ORDER — CYCLOBENZAPRINE HCL 5 MG PO TABS: 5.0000 mg | ORAL_TABLET | Freq: Three times a day (TID) | ORAL | 0 refills | Status: DC | PRN

## 2022-05-16 MED ORDER — CYCLOBENZAPRINE HCL 5 MG PO TABS
5.0000 mg | ORAL_TABLET | Freq: Once | ORAL | Status: AC
  Administered 2022-05-16: 5 mg via ORAL
  Filled 2022-05-16 (×2): qty 1

## 2022-05-16 MED ORDER — ACETAMINOPHEN 500 MG PO TABS
1000.0000 mg | ORAL_TABLET | Freq: Once | ORAL | Status: AC
  Administered 2022-05-16: 1000 mg via ORAL
  Filled 2022-05-16: qty 2

## 2022-05-16 NOTE — MAU Provider Note (Addendum)
History     CSN: [id number]  Arrival date and time: 05/16/22 1732   Event Date/Time   First Provider Initiated Contact with Patient 05/16/22 1804      Chief Complaint  Patient presents with   Back Pain   HPI Tricia Campbell is a 31 y.o. at 79w4dhere in MAU reporting: woke up this AM woke up with intermittent, sharp left side and back pain that is worse with movement. Has not taken anything for pain. Denies trauma. Denies dysuria and hematuria. Denies changes in appetite, vomiting. Denies VB or LOF. Last BM yesterday without pain. Not taking promethazine. Denies use of TUMS. Endorses pain on deep inhalation and racing heart.  OB History     Gravida  4   Para  1   Term  1   Preterm  0   AB  2   Living  1      SAB  0   IAB  2   Ectopic  0   Multiple  0   Live Births  1           Past Medical History:  Diagnosis Date   Vaginal delivery     Past Surgical History:  Procedure Laterality Date   APPENDECTOMY     LAPAROSCOPIC APPENDECTOMY N/A 07/23/2018   Procedure: APPENDECTOMY LAPAROSCOPIC;  Surgeon: NAlphonsa Overall MD;  Location: [facility];  Service: General;  Laterality: N/A;   WISDOM TOOTH EXTRACTION      Family History  Problem Relation Age of Onset   Asthma Neg Hx    Diabetes Neg Hx    Heart disease Neg Hx    Hypertension Neg Hx    Kidney disease Neg Hx    Stroke Neg Hx     Social History   Tobacco Use   Smoking status: Former    Packs/day: 0.20    Types: Cigars, Cigarettes    Quit date: 12/05/2015    Years since quitting: 6.4   Smokeless tobacco: Never   Tobacco comments:    Black and milds some days  Vaping Use   Vaping Use: Never used  Substance Use Topics   Alcohol use: No   Drug use: No    Allergies:  Allergies  Allergen Reactions   Latex Rash    Medications Prior to Admission  Medication Sig Dispense Refill Last Dose   acetaminophen (TYLENOL) 325 MG tablet Take 2 tablets (650 mg total) by mouth every 4 (four)  hours as needed. (Patient not taking: Reported on 05/16/2022) 180 tablet 0    Blood Pressure Monitoring (BLOOD PRESSURE KIT) DEVI 1 Device by Does not apply route as needed. 1 each 0    Misc. Devices (GOJJI WEIGHT SCALE) MISC 1 Device by Does not apply route as needed. 1 each 0    promethazine (PHENERGAN) 12.5 MG tablet Take 1 tablet (12.5 mg total) by mouth every 6 (six) hours as needed for nausea or vomiting. (Patient not taking: Reported on 05/16/2022) 30 tablet 0     Review of Systems  Constitutional:  Positive for appetite change (decreased appetite).  Respiratory:  Negative for cough, chest tightness and shortness of breath.   Cardiovascular:  Negative for chest pain and palpitations.  Gastrointestinal:  Negative for abdominal distention, abdominal pain, blood in stool, constipation, diarrhea, nausea and vomiting.  Musculoskeletal:  Positive for back pain (left sided erector tenderness). Negative for gait problem and myalgias.  Skin:  Negative for color change, rash and wound.   Physical Exam  Blood pressure 139/75, pulse (!) 106, temperature 98.1 F (36.7 C), temperature source Oral, resp. rate 16, height _0  (1.676 m), weight 55.3 kg, SpO2 98 %.  Physical Exam Vitals and nursing note reviewed. Exam conducted with a chaperone present.  Constitutional:      Appearance: Normal appearance. She is normal weight.  Musculoskeletal:        General: Tenderness (left erector spinae TTP) present. No swelling, deformity or signs of injury.  Skin:    General: Skin is warm and dry.  Neurological:     Mental Status: She is alert.    Results for orders placed or performed during the hospital encounter of 05/16/22 (from the past 24 hour(s))  Urinalysis, Routine w reflex microscopic Urine, Clean Catch     Status: Abnormal   Collection Time: 05/16/22  6:01 PM  Result Value Ref Range   Color, Urine YELLOW YELLOW   APPearance HAZY (A) CLEAR   Specific Gravity, Urine 1.025 1.005 - 1.030    pH 5.0 5.0 - 8.0   Glucose, UA NEGATIVE NEGATIVE mg/dL   Hgb urine dipstick NEGATIVE NEGATIVE   Bilirubin Urine NEGATIVE NEGATIVE   Ketones, ur NEGATIVE NEGATIVE mg/dL   Protein, ur NEGATIVE NEGATIVE mg/dL   Nitrite NEGATIVE NEGATIVE   Leukocytes,Ua NEGATIVE NEGATIVE   No results found.   MAU Course  Procedures  MDM Lab results received and reviewed. US done in room showed di/di IUPS with FH activity. Clinical presentation of back pn indicative of MSK pathology. Pt will be given Flexril for pn relief. F/u PRN. Pt stable to discharge to home. Return precautions D/W pt.   Assessment and Plan  US revealed di/di IUPS w/ FH activity. L-sided erector spinae spasm.  Brenn A Bolding, PA-S 05/16/2022, 6:13 PM   CNM attestation:  I have seen and examined this patient and agree with above documentation in the PA student's note.   Tricia Campbell is a 31 y.o. G4P1021 at 19w4dreporting left sided rib and back pain that started this morning. Pain is constantly there however has moments that are sharp and stabbing. She has not taken anything to relieve the pain as Tylenol does not help, but has used a heating pad which does relieve pain somewhat. Pain is aggravated with movement. She reports that she cannot pinpoint any moment in which she would've injured the area. Has not done any heavy lifting. She is not having any CP or SOB. She denies vaginal bleeding, itching/odor, urinary s/s. Had a normal BM yesterday.   Pregnancy is complicated by di/di twin gestation.   [facility]  PE: Patient Vitals for the past 24 hrs:  BP Temp Temp src Pulse Resp SpO2 Height Weight  05/16/22 1958 (!) 134/57 -- -- 95 -- -- -- --  05/16/22 1800 139/75 -- -- (!) 106 -- -- -- --  05/16/22 1745 (!) 146/89 98.1 F (36.7 C) Oral 97 16 98 % -- --  05/16/22 1742 -- -- -- -- -- -- _1  (1.676 m) 55.3 kg   Gen: calm, comfortable, NAD Resp: normal effort, no distress Heart:  regular rate Abd: soft, non-tender Back: TTP along upper back and rib cage along the paraspinous muscles  ROS, labs, PMH reviewed  Orders Placed This Encounter  Procedures   Urinalysis, Routine w reflex microscopic Urine, Clean Catch   Discharge patient   Meds ordered this encounter  Medications   cyclobenzaprine (FLEXERIL) tablet 5 mg   acetaminophen (TYLENOL)  tablet 1,000 mg   cyclobenzaprine (FLEXERIL) 5 MG tablet    Sig: Take 1 tablet (5 mg total) by mouth 3 (three) times daily as needed for muscle spasms.    Dispense:  20 tablet    Refill:  0    Order Specific Question:   Supervising Provider    Answer:   Elonda Husky, LUTHER H [[id number]]    MDM UA negative. Patient was given a dose of Tylenol and Flexeril PO. Bedside US performed by C. Maryruth Hancock, CNM- see separate note for further details.   After approximately 20 minutes after receiving medication, patient is requesting discharge home. She reports she feels like the medication is starting to "kick in" and help. I suspect pain is MSK in nature. Will send rx for Flexeril to pharmacy.   At discharge, patient reports pain is much better and is able to move without much pain/difficulty.   Assessment: 1. Supervision of other normal pregnancy, antepartum   2. Costochondritis   3. [redacted] weeks gestation of pregnancy   4. Dichorionic diamniotic twin pregnancy in first trimester     Plan: - Discharge home in stable condition - Rx for Flexeril sent. May also use Tylenol and heating pad prn for pain. - Strict return precautions reviewed. Return to MAU as needed  for new/worsening symptoms - Keep OB appointment as scheduled on 10/23   Renee Harder, CNM 05/16/2022 8:05 PM

## 2022-05-16 NOTE — MAU Note (Signed)
Tricia Campbell is a 31 y.o. at 37w4dhere in MAU reporting: woke up this AM woke up with sharp left side and back pain. Has not taken anything for pain. No bleeding or discharge.   Onset of complaint: today  Pain score: 10/10  Vitals:   05/16/22 1745  BP: (!) 146/89  Pulse: 97  Resp: 16  Temp: 98.1 F (36.7 C)  SpO2: 98%     FHT: NA  Lab orders placed from triage: UA

## 2022-05-16 NOTE — Progress Notes (Signed)
Pt informed that the ultrasound is considered a limited OB ultrasound and is not intended to be a complete ultrasound exam.  Patient also informed that the ultrasound is not being completed with the intent of assessing for fetal or placental anomalies or any pelvic abnormalities.  Explained that the purpose of today's ultrasound is to assess for  viability.  Patient acknowledges the purpose of the exam and the limitations of the study.     Baby A visualized with fetal movement and FHR of 131 bpm and Baby B visualized with FHR of 154 bpm.  Wende Mott, CNM 05/16/22 7:47 PM

## 2022-05-16 NOTE — Progress Notes (Signed)
New OB Intake  I connected with  Tricia Campbell on 05/16/22 at  3:15 PM EDT by MyChart Video Visit and verified that I am speaking with the correct person using two identifiers. Nurse is located at [facility] and pt is located at home.  I discussed the limitations, risks, security and privacy concerns of performing an evaluation and management service by telephone and the availability of in person appointments. I also discussed with the patient that there may be a patient responsible charge related to this service. The patient expressed understanding and agreed to proceed.  I explained I am completing New OB Intake today. We discussed her EDD of 12/15/2022 that is based on ultrasound 04/22/2022 Pt is G4/P1. I reviewed her allergies, medications, Medical/Surgical/OB history, and appropriate screenings. I informed her of [facility] services. [facility] information placed in AVS. Based on history, this is a/an  pregnancy uncomplicated .   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   Delivery Plans Plans to deliver at [facility]. Patient given information for [facility] Healthy Baby website for more information about [facility] and [place]. Patient is not interested in water birth. Offered upcoming OB visit with CNM to discuss further.  MyChart/Babyscripts MyChart access verified. I explained pt will have some visits in office and some virtually. Babyscripts instructions given and order placed. Patient verifies receipt of registration text/e-mail. Account successfully created and app downloaded.  Blood Pressure Cuff/Weight Scale Blood pressure cuff ordered for patient to pick-up from [employer]. Explained after first prenatal appt pt will check weekly and document in  61. Patient does not  have weight scale. Weight scale ordered for patient to pick up from [employer].   Anatomy US Explained first scheduled [place] will be around 19 weeks. Anatomy US scheduled for 07/23/2022 at 09:45 AM. Pt notified to arrive at 09:15 AM.  Labs Discussed Natera genetic screening with patient. Would like both Panorama and Horizon drawn at new OB visit. Routine prenatal labs needed.  Covid Vaccine Patient has not covid vaccine.   Is patient a CenteringPregnancy candidate?  Declined Declined due to Schedule/Times    Is patient a Mom+Baby Combined Care candidate?  Not a candidate    Social Determinants of Health Food Insecurity: Patient denies food insecurity. WIC Referral: Patient is interested in referral to [facility].  Transportation: Patient denies transportation needs. Childcare: Discussed no children allowed at ultrasound appointments. Offered childcare services; patient declines childcare services at this time.  First visit review I reviewed new OB appt with pt. I explained she will have a provider visit that includes initial ob lab, genetic screening, and pelvic exam. Explained pt will be seen by Naaman Plummer Autry-Lott DO  at first visit; encounter routed to appropriate provider. Explained that patient will be seen by pregnancy navigator following visit with provider.   Mariane Baumgarten, [place] 05/16/2022  3:17 PM

## 2022-05-28 ENCOUNTER — Ambulatory Visit (INDEPENDENT_AMBULATORY_CARE_PROVIDER_SITE_OTHER): Payer: Medicaid Other | Admitting: Family Medicine

## 2022-05-28 ENCOUNTER — Other Ambulatory Visit (HOSPITAL_COMMUNITY)
Admission: RE | Admit: 2022-05-28 | Discharge: 2022-05-28 | Disposition: A | Discharge status: Home / Self Care | Payer: Medicaid Other | Source: Ambulatory Visit | Attending: Family Medicine | Admitting: Family Medicine

## 2022-05-28 VITALS — BP 131/70 | HR 85 | Wt 123.4 lb

## 2022-05-28 DIAGNOSIS — O30041 Twin pregnancy, dichorionic/diamniotic, first trimester: Secondary | ICD-10-CM | POA: Diagnosis not present

## 2022-05-28 DIAGNOSIS — Z3A11 11 weeks gestation of pregnancy: Secondary | ICD-10-CM | POA: Diagnosis not present

## 2022-05-28 DIAGNOSIS — O0991 Supervision of high risk pregnancy, unspecified, first trimester: Secondary | ICD-10-CM

## 2022-05-28 DIAGNOSIS — Z3480 Encounter for supervision of other normal pregnancy, unspecified trimester: Secondary | ICD-10-CM

## 2022-05-28 DIAGNOSIS — Z3481 Encounter for supervision of other normal pregnancy, first trimester: Secondary | ICD-10-CM | POA: Diagnosis not present

## 2022-05-28 DIAGNOSIS — O30049 Twin pregnancy, dichorionic/diamniotic, unspecified trimester: Secondary | ICD-10-CM

## 2022-05-28 DIAGNOSIS — O0990 Supervision of high risk pregnancy, unspecified, unspecified trimester: Secondary | ICD-10-CM

## 2022-05-28 NOTE — Progress Notes (Signed)
History:   Tricia Campbell is a 31 y.o. Q6S3419 at 65w2dby early ultrasound being seen today for her first obstetrical visit.  Her obstetrical history is significant for IAB x2 and full term vaginal live birth. Patient does intend to breast feed. Pregnancy history fully reviewed.  Patient reports  intermittent left lower abdominal cramping. Not currently .      HISTORY: OB History  Gravida Para Term Preterm AB Living  _0 0 2 1  SAB IAB Ectopic Multiple Live Births  0 2 0 0 1    # Outcome Date GA Lbr Len/2nd Weight Sex Delivery Anes PTL Lv  4 Current           3 IAB 2021          2 IAB 2020          1 Term 02/25/17 351w1d4:55 / 00:12 7 lb 7.1 oz (3.375 kg) M Vag-Spont None  LIV     Name: Tricia Campbell   Apgar1: 9  Apgar5: 9    Last pap smear was done 03/01/2021 and was normal  Past Medical History:  Diagnosis Date   Vaginal delivery    Past Surgical History:  Procedure Laterality Date   APPENDECTOMY     LAPAROSCOPIC APPENDECTOMY N/A 07/23/2018   Procedure: APPENDECTOMY LAPAROSCOPIC;  Surgeon: NeAlphonsa OverallMD;  Location: [facility];  Service: General;  Laterality: N/A;   WISDOM TOOTH EXTRACTION     Family History  Problem Relation Age of Onset   Asthma Neg Hx    Diabetes Neg Hx    Heart disease Neg Hx    Hypertension Neg Hx    Kidney disease Neg Hx    Stroke Neg Hx    Social History   Tobacco Use   Smoking status: Former    Packs/day: 0.20    Types: Cigars, Cigarettes    Quit date: 12/05/2015    Years since quitting: 6.4   Smokeless tobacco: Never   Tobacco comments:    Black and milds some days  Vaping Use   Vaping Use: Never used  Substance Use Topics   Alcohol use: No   Drug use: No   Allergies  Allergen Reactions   Latex Rash   Current Outpatient Medications on File Prior to Visit  Medication Sig Dispense Refill   Blood Pressure Monitoring (BLOOD PRESSURE KIT) DEVI 1 Device by Does not apply route as needed. 1 each 0    cyclobenzaprine (FLEXERIL) 5 MG tablet Take 1 tablet (5 mg total) by mouth 3 (three) times daily as needed for muscle spasms. 20 tablet 0   Misc. Devices (GOJJI WEIGHT SCALE) MISC 1 Device by Does not apply route as needed. 1 each 0   promethazine (PHENERGAN) 12.5 MG tablet Take 1 tablet (12.5 mg total) by mouth every 6 (six) hours as needed for nausea or vomiting. (Patient not taking: Reported on 05/16/2022) 30 tablet 0   No current facility-administered medications on file prior to visit.    Review of Systems Pertinent items noted in HPI and remainder of comprehensive ROS otherwise negative.  Indications for ASA therapy (per UpToDate) One of the following: Previous pregnancy with preeclampsia, especially early onset and with an adverse outcome No Multifetal gestation Yes Chronic hypertension No Type 1 or 2 diabetes mellitus No Chronic kidney disease No Autoimmune disease (antiphospholipid syndrome, systemic lupus erythematosus) No Two or more of the following: Nulliparity No Obesity (body mass index >30 kg/m2) No Family  history of preeclampsia in mother or sister unknown Age ?35 years No Sociodemographic characteristics (African American race, low socioeconomic level) Yes Personal risk factors (eg, previous pregnancy with low birth weight or small for gestational age infant, previous adverse pregnancy outcome [eg, stillbirth], interval >10 years between pregnancies) No  Indications for early GDM screening (FBS, A1C, Random CBG, GTT) First-degree relative with diabetes Yes BMI >30kg/m2 No Age > 35 No Previous birth of an infant weighing ?4000 g No Gestational diabetes mellitus in a previous pregnancy No Glycated hemoglobin ?5.7 percent (39 mmol/mol), impaired glucose tolerance, or impaired fasting glucose on previous testing No High-risk race/ethnicity (eg, African American, Latino, Native American, [place] American, Pacific Islander) Yes Previous stillbirth of unknown cause  No Maternal birthweight > 9 lbs No History of cardiovascular disease No Hypertension or on therapy for hypertension No High-density lipoprotein cholesterol level <35 mg/dL (0.90 mmol/L) and/or a triglyceride level >250 mg/dL (2.82 mmol/L) No Polycystic ovary syndrome No Physical inactivity No Other clinical condition associated with insulin resistance (eg, severe obesity, acanthosis nigricans) No Current use of glucocorticoids No  Physical Exam:   Vitals:   05/28/22 1436  BP: 131/70  Pulse: 85  Weight: 123 lb 6.4 oz (56 kg)   Bedside Ultrasound for FHR check: Viable intrauterine pregnancy with positive cardiac activity noted for both di-di twins.  Patient informed that the ultrasound is considered a limited obstetric ultrasound and is not intended to be a complete ultrasound exam.  Patient also informed that the ultrasound is not being completed with the intent of assessing for fetal or placental anomalies or any pelvic abnormalities.  Explained that the purpose of today's ultrasound is to assess for fetal heart rate.  Patient acknowledges the purpose of the exam and the limitations of the study.  General: well-developed, well-nourished female in no acute distress  Breasts:  deferred  Skin: normal coloration and turgor, no rashes  Neurologic: oriented, normal, negative, normal mood  Extremities: normal strength, tone, and muscle mass, ROM of all joints is normal  HEENT extraocular movement intact and sclera clear, anicteric  Neck supple and no masses  Cardiovascular: regular rate and rhythm  Respiratory:  no respiratory distress, normal breath sounds  Abdomen: soft, non-tender; bowel sounds normal; no masses,  no organomegaly  Pelvic: Deferred.    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 Supervision of other  normal pregnancy, antepartum Doing well. Having occasional pelvic cramping without vaginal bleeding. MAU precautions given.  - CBC/D/Plt+RPR+Rh+ABO+RubIgG... - HgB A1c - Panorama Prenatal Test Full Panel - [place] Custom - Culture, OB Urine - Cervicovaginal ancillary only( [place])  2. Dichorionic diamniotic twin pregnancy in first trimester Positive fetal cardiac activity x2 today. Scheduled for anatomy scan 12/18  3. [redacted] weeks gestation of pregnancy -Start ASA between 12-16 weeks -Follow up in 4 weeks  Initial labs drawn. Continue prenatal vitamins. Problem list reviewed and updated. Genetic Screening discussed, Panorama and Horizon: ordered. Ultrasound discussed; fetal anatomic survey: scheduled. Anticipatory guidance about prenatal visits given including labs, ultrasounds, and testing. Weight gain recommendations per IOM guidelines reviewed: underweight/BMI 18.5 or less > 28 - 40 lbs; normal weight/BMI 18.5 - 24.9 > 25 - 35 lbs; overweight/BMI 25 - 29.9 > 15 - 25 lbs; obese/BMI  30 or more > 11 - 20 lbs. Discussed usage of  the Babyscripts app for more information about pregnancy, and to track blood pressures. Patient was encouraged to use MyChart to review results, send requests, and have questions addressed.   The nature of [place] for [facility] Healthcare/Faculty Practice with multiple MDs and Advanced Practice Providers was explained to patient; also emphasized that residents, students are part of our team. Routine obstetric precautions reviewed. Encouraged to seek out care at our office or emergency room [facility] MAU preferred) for urgent and/or emergent concerns. Return in about 4 weeks (around 06/25/2022) for HROB follow up.    Gerlene Fee, DO OB Fellow, [place] [facility] [place] 05/28/2022, 12:58 PM

## 2022-05-28 NOTE — Patient Instructions (Addendum)
For nausea you can try: Unisom (doxylamine succinate 25 mg tablets) Take one tablet daily at bedtime. If symptoms are not adequately controlled, the dose can be increased to a maximum recommended dose of two tablets daily (1/2 tablet in the morning, 1/2 tablet mid-afternoon and one at bedtime).  Vitamin B6 '100mg'$  tablets. Take one tablet twice a day (up to 200 mg per day).

## 2022-05-29 DIAGNOSIS — O30049 Twin pregnancy, dichorionic/diamniotic, unspecified trimester: Secondary | ICD-10-CM | POA: Insufficient documentation

## 2022-05-29 LAB — CBC/D/PLT+RPR+RH+ABO+RUBIGG...
Antibody Screen: NEGATIVE
Basophils Absolute: 0 10*3/uL (ref 0.0–0.2)
Basos: 0 %
EOS (ABSOLUTE): 0.1 10*3/uL (ref 0.0–0.4)
Eos: 1 %
HCV Ab: NONREACTIVE
HIV Screen 4th Generation wRfx: NONREACTIVE
Hematocrit: 39.6 % (ref 34.0–46.6)
Hemoglobin: 13.2 g/dL (ref 11.1–15.9)
Hepatitis B Surface Ag: NEGATIVE
Immature Grans (Abs): 0 10*3/uL (ref 0.0–0.1)
Immature Granulocytes: 0 %
Lymphocytes Absolute: 1.4 10*3/uL (ref 0.7–3.1)
Lymphs: 12 %
MCH: 29 pg (ref 26.6–33.0)
MCHC: 33.3 g/dL (ref 31.5–35.7)
MCV: 87 fL (ref 79–97)
Monocytes Absolute: 1 10*3/uL — ABNORMAL HIGH (ref 0.1–0.9)
Monocytes: 9 %
Neutrophils Absolute: 9.1 10*3/uL — ABNORMAL HIGH (ref 1.4–7.0)
Neutrophils: 78 %
Platelets: 285 10*3/uL (ref 150–450)
RBC: 4.55 x10E6/uL (ref 3.77–5.28)
RDW: 12.8 % (ref 11.7–15.4)
RPR Ser Ql: NONREACTIVE
Rh Factor: POSITIVE
Rubella Antibodies, IGG: 3.6 index (ref >0.99)
WBC: 11.7 10*3/uL — ABNORMAL HIGH (ref 3.4–10.8)

## 2022-05-29 LAB — HCV INTERPRETATION

## 2022-05-29 LAB — HEMOGLOBIN A1C
Est. average glucose Bld gHb Est-mCnc: 100 mg/dL
Hgb A1c MFr Bld: 5.1 % (ref 4.8–5.6)

## 2022-05-30 LAB — CERVICOVAGINAL ANCILLARY ONLY
Chlamydia: NEGATIVE
Comment: NEGATIVE
Comment: NEGATIVE
Comment: NORMAL
Neisseria Gonorrhea: NEGATIVE
Trichomonas: NEGATIVE

## 2022-05-30 LAB — URINE CULTURE, OB REFLEX

## 2022-05-30 LAB — CULTURE, OB URINE

## 2022-06-04 LAB — PANORAMA PRENATAL TEST FULL PANEL:PANORAMA TEST PLUS 5 ADDITIONAL MICRODELETIONS
FETAL FRACTION SECOND FETUS: 6.3
FETAL FRACTION: 7

## 2022-06-04 LAB — HORIZON CUSTOM: REPORT SUMMARY: NEGATIVE

## 2022-06-26 ENCOUNTER — Ambulatory Visit (INDEPENDENT_AMBULATORY_CARE_PROVIDER_SITE_OTHER): Payer: Medicaid Other | Admitting: Family Medicine

## 2022-06-26 ENCOUNTER — Encounter: Payer: Self-pay | Admitting: Family Medicine

## 2022-06-26 VITALS — BP 134/79 | HR 111 | Wt 128.1 lb

## 2022-06-26 DIAGNOSIS — O30049 Twin pregnancy, dichorionic/diamniotic, unspecified trimester: Secondary | ICD-10-CM | POA: Diagnosis not present

## 2022-06-26 DIAGNOSIS — O0990 Supervision of high risk pregnancy, unspecified, unspecified trimester: Secondary | ICD-10-CM

## 2022-06-26 DIAGNOSIS — O26892 Other specified pregnancy related conditions, second trimester: Secondary | ICD-10-CM | POA: Diagnosis not present

## 2022-06-26 DIAGNOSIS — R519 Headache, unspecified: Secondary | ICD-10-CM | POA: Diagnosis not present

## 2022-06-26 DIAGNOSIS — Z3A15 15 weeks gestation of pregnancy: Secondary | ICD-10-CM

## 2022-06-26 MED ORDER — MAGNESIUM 400 MG PO CAPS: 1.0000 | ORAL_CAPSULE | Freq: Every day | ORAL | 11 refills | Status: DC

## 2022-06-26 MED ORDER — CYCLOBENZAPRINE HCL 5 MG PO TABS: 5.0000 mg | ORAL_TABLET | Freq: Three times a day (TID) | ORAL | 0 refills | Status: DC | PRN

## 2022-06-26 MED ORDER — ASPIRIN 81 MG PO TBEC: 81.0000 mg | DELAYED_RELEASE_TABLET | Freq: Every day | ORAL | 12 refills | Status: DC

## 2022-06-26 NOTE — Progress Notes (Signed)
   Subjective:  Tricia Campbell is a 31 y.o. G4P1021 at 38w3dbeing seen today for ongoing prenatal care.  She is currently monitored for the following issues for this high-risk pregnancy and has Supervision of high risk pregnancy, antepartum and Dichorionic diamniotic twin pregnancy, antepartum on their problem list.  Patient reports no complaints.  Contractions: Not present. Vag. Bleeding: None.  Movement: Present. Denies leaking of fluid.   The following portions of the patient's history were reviewed and updated as appropriate: allergies, current medications, past family history, past medical history, past social history, past surgical history and problem list. Problem list updated.  Objective:   Vitals:   06/26/22 1103  BP: 134/79  Pulse: (!) 111  Weight: 128 lb 1.6 oz (58.1 kg)    Fetal Status: Fetal Heart Rate (bpm): 150/142   Movement: Present     General:  Alert, oriented and cooperative. Patient is in no acute distress.  Skin: Skin is warm and dry. No rash noted.   Cardiovascular: Normal heart rate noted  Respiratory: Normal respiratory effort, no problems with respiration noted  Abdomen: Soft, gravid, appropriate for gestational age. Pain/Pressure: Absent     Pelvic: Vag. Bleeding: None     Cervical exam deferred        Extremities: Normal range of motion.     Mental Status: Normal mood and affect. Normal behavior. Normal judgment and thought content.   Urinalysis:      Assessment and Plan:  Pregnancy: G4P1021 at 119w3d1. Supervision of high risk pregnancy, antepartum BP and FHRs normal AFP today Has some headaches, trial magnesium and flexeril  2. Dichorionic diamniotic twin pregnancy, antepartum Start ASA Has anatomy scan scheduled  Preterm labor symptoms and general obstetric precautions including but not limited to vaginal bleeding, contractions, leaking of fluid and fetal movement were reviewed in detail with the patient. Please refer to After  Visit Summary for other counseling recommendations.  Return in 4 weeks (on 07/24/2022) [facility].   EcClarnce FlockMD

## 2022-06-26 NOTE — Patient Instructions (Signed)

## 2022-06-28 LAB — AFP, SERUM, OPEN SPINA BIFIDA
AFP MoM: 1.47
AFP Value: 56.2 ng/mL
Gest. Age on Collection Date: 15.4 weeks
Maternal Age At EDD: 30.1 yr
OSBR Risk 1 IN: 10000
Test Results:: NEGATIVE
Weight: 128 [lb_av]

## 2022-07-16 ENCOUNTER — Other Ambulatory Visit: Payer: Self-pay

## 2022-07-16 ENCOUNTER — Other Ambulatory Visit: Payer: Medicaid Other

## 2022-07-16 ENCOUNTER — Inpatient Hospital Stay (HOSPITAL_BASED_OUTPATIENT_CLINIC_OR_DEPARTMENT_OTHER): Payer: Medicaid Other

## 2022-07-16 ENCOUNTER — Ambulatory Visit (INDEPENDENT_AMBULATORY_CARE_PROVIDER_SITE_OTHER): Payer: Medicaid Other | Admitting: Family Medicine

## 2022-07-16 ENCOUNTER — Inpatient Hospital Stay (EMERGENCY_DEPARTMENT_HOSPITAL)
Admission: AD | Admit: 2022-07-16 | Discharge: 2022-07-16 | Disposition: A | Discharge status: Home / Self Care | Payer: Medicaid Other | Source: Home / Self Care | Attending: Family Medicine | Admitting: Family Medicine

## 2022-07-16 VITALS — BP 123/71 | HR 92 | Wt 139.4 lb

## 2022-07-16 DIAGNOSIS — O42919 Preterm premature rupture of membranes, unspecified as to length of time between rupture and onset of labor, unspecified trimester: Secondary | ICD-10-CM

## 2022-07-16 DIAGNOSIS — Z3480 Encounter for supervision of other normal pregnancy, unspecified trimester: Secondary | ICD-10-CM

## 2022-07-16 DIAGNOSIS — O0990 Supervision of high risk pregnancy, unspecified, unspecified trimester: Secondary | ICD-10-CM | POA: Diagnosis not present

## 2022-07-16 DIAGNOSIS — O42912 Preterm premature rupture of membranes, unspecified as to length of time between rupture and onset of labor, second trimester: Secondary | ICD-10-CM | POA: Insufficient documentation

## 2022-07-16 DIAGNOSIS — O4692 Antepartum hemorrhage, unspecified, second trimester: Secondary | ICD-10-CM | POA: Insufficient documentation

## 2022-07-16 DIAGNOSIS — Z3A18 18 weeks gestation of pregnancy: Secondary | ICD-10-CM

## 2022-07-16 DIAGNOSIS — O0992 Supervision of high risk pregnancy, unspecified, second trimester: Secondary | ICD-10-CM | POA: Insufficient documentation

## 2022-07-16 DIAGNOSIS — Z7982 Long term (current) use of aspirin: Secondary | ICD-10-CM | POA: Diagnosis not present

## 2022-07-16 DIAGNOSIS — O30042 Twin pregnancy, dichorionic/diamniotic, second trimester: Secondary | ICD-10-CM | POA: Insufficient documentation

## 2022-07-16 DIAGNOSIS — O30049 Twin pregnancy, dichorionic/diamniotic, unspecified trimester: Secondary | ICD-10-CM

## 2022-07-16 DIAGNOSIS — O4690 Antepartum hemorrhage, unspecified, unspecified trimester: Secondary | ICD-10-CM

## 2022-07-16 DIAGNOSIS — Z79899 Other long term (current) drug therapy: Secondary | ICD-10-CM | POA: Diagnosis not present

## 2022-07-16 DIAGNOSIS — Z87891 Personal history of nicotine dependence: Secondary | ICD-10-CM | POA: Diagnosis not present

## 2022-07-16 DIAGNOSIS — O3122X1 Continuing pregnancy after intrauterine death of one fetus or more, second trimester, fetus 1: Secondary | ICD-10-CM | POA: Diagnosis not present

## 2022-07-16 LAB — AMNISURE RUPTURE OF MEMBRANE (ROM) NOT AT ARMC: Amnisure ROM: POSITIVE

## 2022-07-16 LAB — POCT FERN TEST: POCT Fern Test: POSITIVE

## 2022-07-16 MED ORDER — BLOOD PRESSURE KIT DEVI: 1.0000 | 0 refills | Status: DC | PRN

## 2022-07-16 NOTE — Discharge Instructions (Signed)
Return to MAU right away if you have:  Fever Severe abdominal pain  Foul smelling vaginal discharge  Heavy vaginal bleeding   Do not have intercourse of put anything in your vagina

## 2022-07-16 NOTE — MAU Provider Note (Signed)
History     CSN: [id number]  Arrival date and time: 07/16/22 1036   Event Date/Time   First Provider Initiated Contact with Patient 07/16/22 1117      Chief Complaint  Patient presents with   Vaginal Bleeding   Tricia Campbell is a 89 y.K.G4W1027 at 80w2dwho presents today with bleeding. She states that yesterday after intercourse she started bleeding. She has had to change her pad about 3 times since around 0200. Since the bleeding has started she has had some 5/10 cramping. She has started to feel some occasional fetal movement.   Vaginal Bleeding The patient's primary symptoms include pelvic pain and vaginal discharge. This is a new problem. The current episode started yesterday. The problem occurs intermittently. The problem has been unchanged. The problem affects both sides. She is pregnant. The vaginal discharge was bloody. The vaginal bleeding is lighter than menses. She has not been passing clots. She has not been passing tissue. The symptoms are aggravated by intercourse. She has tried nothing for the symptoms. She is sexually active.    OB History     Gravida  4   Para  1   Term  1   Preterm  0   AB  2   Living  1      SAB  0   IAB  2   Ectopic  0   Multiple  0   Live Births  1           Past Medical History:  Diagnosis Date   History of marijuana use 08/17/2016   +UDS at first prenatal visit   Vaginal delivery     Past Surgical History:  Procedure Laterality Date   APPENDECTOMY     LAPAROSCOPIC APPENDECTOMY N/A 07/23/2018   Procedure: APPENDECTOMY LAPAROSCOPIC;  Surgeon: NAlphonsa Overall MD;  Location: [facility];  Service: General;  Laterality: N/A;   WISDOM TOOTH EXTRACTION      Family History  Problem Relation Age of Onset   Asthma Neg Hx    Diabetes Neg Hx    Heart disease Neg Hx    Hypertension Neg Hx    Kidney disease Neg Hx    Stroke Neg Hx     Social History   Tobacco Use   Smoking status: Former    Packs/day: 0.20     Types: Cigars, Cigarettes    Quit date: 12/05/2015    Years since quitting: 6.6   Smokeless tobacco: Never   Tobacco comments:    Black and milds some days  Vaping Use   Vaping Use: Never used  Substance Use Topics   Alcohol use: No   Drug use: No    Allergies:  Allergies  Allergen Reactions   Latex Rash    Medications Prior to Admission  Medication Sig Dispense Refill Last Dose   aspirin EC 81 MG tablet Take 1 tablet (81 mg total) by mouth daily. Swallow whole. 30 tablet 12    Blood Pressure Monitoring (BLOOD PRESSURE KIT) DEVI 1 Device by Does not apply route as needed. 1 each 0    cyclobenzaprine (FLEXERIL) 5 MG tablet Take 1 tablet (5 mg total) by mouth 3 (three) times daily as needed for muscle spasms. 20 tablet 0    Magnesium 400 MG CAPS Take 1 capsule by mouth daily. 30 capsule 11    promethazine (PHENERGAN) 12.5 MG tablet Take 1 tablet (12.5 mg total) by mouth every 6 (six) hours as needed for nausea or vomiting. 3[place]  tablet 0     Review of Systems  Genitourinary:  Positive for pelvic pain, vaginal bleeding and vaginal discharge.  All other systems reviewed and are negative.  Physical Exam   Blood pressure 108/63, pulse 97, temperature 98.6 F (37 C), temperature source Oral, resp. rate 18, height _0  (1.676 m), weight 64.3 kg, SpO2 100 %.  Physical Exam Constitutional:      Appearance: She is well-developed.  HENT:     Head: Normocephalic.  Eyes:     Pupils: Pupils are equal, round, and reactive to light.  Cardiovascular:     Rate and Rhythm: Normal rate and regular rhythm.     Heart sounds: Normal heart sounds.  Pulmonary:     Effort: Pulmonary effort is normal. No respiratory distress.     Breath sounds: Normal breath sounds.  Abdominal:     Palpations: Abdomen is soft.     Tenderness: There is no abdominal tenderness.  Genitourinary:    Vagina: No bleeding. Vaginal discharge: mucusy.    Comments: External: no lesion Vagina: small amount of clear  fluid seen at the introitus      Musculoskeletal:        General: Normal range of motion.     Cervical back: Normal range of motion and neck supple.  Skin:    General: Skin is warm and dry.  Neurological:     Mental Status: She is alert and oriented to person, place, and time.  Psychiatric:        Mood and Affect: Mood normal.        Behavior: Behavior normal.   FHT A: 145 FHT B: 145   MFM [place]: shows anhydramnios for baby A, normal fluid for baby B   Results for orders placed or performed during the hospital encounter of 07/16/22 (from the past 24 hour(s))  Amnisure rupture of membrane (rom)not at [facility]     Status: None   Collection Time: 07/16/22  2:14 PM  Result Value Ref Range   Amnisure ROM POSITIVE   Fern Test     Status: Abnormal   Collection Time: 07/16/22  2:16 PM  Result Value Ref Range   POCT Fern Test Positive = ruptured amniotic membanes     MAU Course  Procedures  MDM Spoke with Dr. Annamaria Boots, he confirms that panorama shows Di/Di twins. PPROM confirmed today with fern and amnisure. He has made an appt for patient in MFM on 12/12 at 11:00.   Assessment and Plan   1. Supervision of high risk pregnancy, antepartum   2. Dichorionic diamniotic twin pregnancy, antepartum   3. Supervision of other normal pregnancy, antepartum   4. Preterm premature rupture of membranes (PPROM) with unknown onset of labor   5. [redacted] weeks gestation of pregnancy   6. Dichorionic diamniotic twin pregnancy in second trimester    DC home in stable condition  2nd Trimester precautions  PTL precautions  Fetal kick counts RX: no new RX  Return to MAU as needed FU with MFM tomorrow 12/12 at 11:00    [place] [facility] Follow up.   Specialty: Maternal and Fetal Medicine Why: Tomorrow Tuesday 12/12 at 11:00 am Contact information: [address], [address] [place] [address] [phone]                Thresea Doble DNP, CNM  07/16/22  3:18 PM

## 2022-07-16 NOTE — MAU Note (Addendum)
Tricia Campbell is a 31 y.o. at 28w2dhere in MAU reporting: VB that began yesterday.  Reports bleeding is a small amount and dark red/brown.  Also endorses mild abdominal cramps.  Admits last  intercourse yesterday. LMP: NA Onset of complaint: yesterday Pain score: 5 Vitals:   07/16/22 1110  BP: 108/63  Pulse: 97  Resp: 18  Temp: 98.6 F (37 C)  SpO2: 100%     FHT: deferred d/t twin gestation Lab orders placed from triage:   UA

## 2022-07-16 NOTE — Progress Notes (Signed)
C/o cramping and small amount of bleeding yesterday and noted scant on pad this am . Denies contractions, feels fetal movement.Reported bleeding to Dr. Caron Presume. Plan to send for evaluation to [facility] MAU. Report called to MAU nursing. Staci Acosta

## 2022-07-16 NOTE — Patient Instructions (Signed)
Go to [email or web address]

## 2022-07-16 NOTE — Progress Notes (Signed)
   PRENATAL VISIT NOTE  Subjective:  Tricia Campbell is a 31 y.o. G4P1021 at 72w2dbeing seen today for ongoing prenatal care.  She is currently monitored for the following issues for this high-risk pregnancy and has Supervision of high risk pregnancy, antepartum; Dichorionic diamniotic twin pregnancy, antepartum; and Vaginal bleeding in pregnancy on their problem list.  Patient reports bleeding, no contractions, and no leaking.  Contractions: Not present. Vag. Bleeding: Small.  Movement: Present. Denies leaking of fluid.   Patient reports that she noticed vaginal bleeding yesterday after intercourse.  She felt up a pad yesterday throughout the day and has a picture on her phone.  She wore a pad overnight and also filled a pad then.  She is currently wearing a pad and feels like she is still bleeding.  Is currently taking her daily 81 mg aspirin.  The following portions of the patient's history were reviewed and updated as appropriate: allergies, current medications, past family history, past medical history, past social history, past surgical history and problem list.   Objective:   Vitals:   07/16/22 1002  BP: 123/71  Pulse: 92  Weight: 139 lb 6.4 oz (63.2 kg)    Fetal Status: Fetal Heart Rate (bpm): 147/143   Movement: Present     General:  Alert, oriented and cooperative. Patient is in no acute distress.  Skin: Skin is warm and dry. No rash noted.   Cardiovascular: Normal heart rate noted  Respiratory: Normal respiratory effort, no problems with respiration noted  Abdomen: Soft, gravid, appropriate for gestational age.  Pain/Pressure: Present     Pelvic: Cervical exam deferred        Extremities: Normal range of motion.  Edema: Trace  Mental Status: Normal mood and affect. Normal behavior. Normal judgment and thought content.   Assessment and Plan:  Pregnancy: G4P1021 at 171w2d. Supervision of high risk pregnancy, antepartum -Patient to continue routine prenatal care.   Scheduled for anatomy ultrasound on 12/18 - Given vaginal bleeding recommended patient go to be evaluated at the maternal assessment unit, MAU provider notified - Blood Pressure Monitoring (BLOOD PRESSURE KIT) DEVI; 1 Device by Does not apply route as needed.  Dispense: 1 each; Refill: 0  2. Dichorionic diamniotic twin pregnancy, antepartum Patient currently taking ASA Anatomy scan scheduled for 12/18  3. Vaginal bleeding in pregnancy Bleeding started yesterday after intercourse.  Has filled 2 pads over the last day.  Reports feeling occasional fetal movement.  Denies any gushing of fluid.  Bleeding could be due to cervical irritation and friability but feel patient needs further evaluation so instructed her to go to the MAU.  Notified provider as well as charge nurse of maternal assessment unit.  Preterm labor symptoms and general obstetric precautions including but not limited to vaginal bleeding, contractions, leaking of fluid and fetal movement were reviewed in detail with the patient. Please refer to After Visit Summary for other counseling recommendations.   No follow-ups on file.  Future Appointments  Date Time Provider DeHubbell12/18/2023  9:30 AM [facility]12/18/2023  9:45 AM [facility] US5 [facility]1/03/2023 10:55 AM SmManya SilvasCNM [facility]  JoConcepcion LivingMD

## 2022-07-17 ENCOUNTER — Other Ambulatory Visit: Payer: Medicaid Other

## 2022-07-17 ENCOUNTER — Ambulatory Visit: Payer: Medicaid Other

## 2022-07-17 ENCOUNTER — Encounter (HOSPITAL_COMMUNITY): Payer: Self-pay | Admitting: Obstetrics and Gynecology

## 2022-07-17 ENCOUNTER — Observation Stay (HOSPITAL_COMMUNITY)
Admission: AD | Admit: 2022-07-17 | Discharge: 2022-07-18 | Disposition: A | Discharge status: Home / Self Care | Payer: Medicaid Other | Attending: Obstetrics and Gynecology | Admitting: Obstetrics and Gynecology

## 2022-07-17 ENCOUNTER — Inpatient Hospital Stay (HOSPITAL_BASED_OUTPATIENT_CLINIC_OR_DEPARTMENT_OTHER): Payer: Medicaid Other

## 2022-07-17 DIAGNOSIS — O039 Complete or unspecified spontaneous abortion without complication: Secondary | ICD-10-CM | POA: Diagnosis present

## 2022-07-17 DIAGNOSIS — O42912 Preterm premature rupture of membranes, unspecified as to length of time between rupture and onset of labor, second trimester: Secondary | ICD-10-CM | POA: Diagnosis present

## 2022-07-17 DIAGNOSIS — O30042 Twin pregnancy, dichorionic/diamniotic, second trimester: Secondary | ICD-10-CM | POA: Diagnosis not present

## 2022-07-17 DIAGNOSIS — O42012 Preterm premature rupture of membranes, onset of labor within 24 hours of rupture, second trimester: Secondary | ICD-10-CM

## 2022-07-17 DIAGNOSIS — O3122X1 Continuing pregnancy after intrauterine death of one fetus or more, second trimester, fetus 1: Secondary | ICD-10-CM | POA: Diagnosis present

## 2022-07-17 DIAGNOSIS — O0990 Supervision of high risk pregnancy, unspecified, unspecified trimester: Principal | ICD-10-CM

## 2022-07-17 DIAGNOSIS — Z87891 Personal history of nicotine dependence: Secondary | ICD-10-CM | POA: Insufficient documentation

## 2022-07-17 DIAGNOSIS — R519 Headache, unspecified: Secondary | ICD-10-CM

## 2022-07-17 DIAGNOSIS — Z7982 Long term (current) use of aspirin: Secondary | ICD-10-CM | POA: Insufficient documentation

## 2022-07-17 DIAGNOSIS — Z3A18 18 weeks gestation of pregnancy: Secondary | ICD-10-CM | POA: Diagnosis not present

## 2022-07-17 DIAGNOSIS — O4290 Premature rupture of membranes, unspecified as to length of time between rupture and onset of labor, unspecified weeks of gestation: Secondary | ICD-10-CM | POA: Diagnosis present

## 2022-07-17 DIAGNOSIS — O30049 Twin pregnancy, dichorionic/diamniotic, unspecified trimester: Secondary | ICD-10-CM

## 2022-07-17 DIAGNOSIS — O4692 Antepartum hemorrhage, unspecified, second trimester: Secondary | ICD-10-CM

## 2022-07-17 DIAGNOSIS — O3112X0 Continuing pregnancy after spontaneous abortion of one fetus or more, second trimester, not applicable or unspecified: Secondary | ICD-10-CM

## 2022-07-17 DIAGNOSIS — Z79899 Other long term (current) drug therapy: Secondary | ICD-10-CM | POA: Insufficient documentation

## 2022-07-17 LAB — URINALYSIS, ROUTINE W REFLEX MICROSCOPIC
Bilirubin Urine: NEGATIVE
Glucose, UA: NEGATIVE mg/dL
Ketones, ur: 20 mg/dL — AB
Nitrite: NEGATIVE
Protein, ur: NEGATIVE mg/dL
Specific Gravity, Urine: 1.008 (ref 1.005–1.030)
pH: 8 (ref 5.0–8.0)

## 2022-07-17 LAB — RAPID URINE DRUG SCREEN, HOSP PERFORMED
Amphetamines: NOT DETECTED
Barbiturates: NOT DETECTED
Benzodiazepines: NOT DETECTED
Cocaine: NOT DETECTED
Opiates: NOT DETECTED
Tetrahydrocannabinol: POSITIVE — AB

## 2022-07-17 LAB — GC/CHLAMYDIA PROBE AMP (~~LOC~~) NOT AT ARMC
Chlamydia: NEGATIVE
Comment: NEGATIVE
Comment: NORMAL
Neisseria Gonorrhea: NEGATIVE

## 2022-07-17 LAB — CBC
HCT: 30.5 % — ABNORMAL LOW (ref 36.0–46.0)
Hemoglobin: 10 g/dL — ABNORMAL LOW (ref 12.0–15.0)
MCH: 29 pg (ref 26.0–34.0)
MCHC: 32.8 g/dL (ref 30.0–36.0)
MCV: 88.4 fL (ref 80.0–100.0)
Platelets: 257 10*3/uL (ref 150–400)
RBC: 3.45 MIL/uL — ABNORMAL LOW (ref 3.87–5.11)
RDW: 14 % (ref 11.5–15.5)
WBC: 20.5 10*3/uL — ABNORMAL HIGH (ref 4.0–10.5)
nRBC: 0 % (ref 0.0–0.2)

## 2022-07-17 LAB — TYPE AND SCREEN
ABO/RH(D): B POS
Antibody Screen: NEGATIVE

## 2022-07-17 MED ORDER — ACETAMINOPHEN 325 MG PO TABS
650.0000 mg | ORAL_TABLET | ORAL | Status: DC | PRN
  Filled 2022-07-17: qty 2

## 2022-07-17 MED ORDER — CYCLOBENZAPRINE HCL 10 MG PO TABS
5.0000 mg | ORAL_TABLET | Freq: Three times a day (TID) | ORAL | Status: DC | PRN
  Administered 2022-07-17: 5 mg via ORAL
  Filled 2022-07-17: qty 1

## 2022-07-17 MED ORDER — PRENATAL MULTIVITAMIN CH
1.0000 | ORAL_TABLET | Freq: Every day | ORAL | Status: DC
  Administered 2022-07-18: 1 via ORAL
  Filled 2022-07-17: qty 1

## 2022-07-17 MED ORDER — CALCIUM CARBONATE ANTACID 500 MG PO CHEW: 2.0000 | CHEWABLE_TABLET | ORAL | Status: DC | PRN

## 2022-07-17 MED ORDER — LACTATED RINGERS IV BOLUS
1000.0000 mL | Freq: Once | INTRAVENOUS | Status: AC
  Administered 2022-07-17: 1000 mL via INTRAVENOUS

## 2022-07-17 MED ORDER — OXYCODONE HCL 5 MG PO TABS
5.0000 mg | ORAL_TABLET | ORAL | Status: DC | PRN
  Administered 2022-07-17 – 2022-07-18 (×3): 5 mg via ORAL
  Filled 2022-07-17 (×3): qty 1

## 2022-07-17 MED ORDER — OXYTOCIN 10 UNIT/ML IJ SOLN
INTRAMUSCULAR | Status: AC
  Filled 2022-07-17: qty 1

## 2022-07-17 MED ORDER — OXYTOCIN-SODIUM CHLORIDE 30-0.9 UT/500ML-% IV SOLN
INTRAVENOUS | Status: AC
  Filled 2022-07-17: qty 500

## 2022-07-17 MED ORDER — LACTATED RINGERS IV SOLN: 125.0000 mL/h | INTRAVENOUS | Status: DC

## 2022-07-17 MED ORDER — FENTANYL CITRATE (PF) 100 MCG/2ML IJ SOLN
50.0000 ug | Freq: Once | INTRAMUSCULAR | Status: AC
  Administered 2022-07-17: 50 ug via INTRAVENOUS
  Filled 2022-07-17: qty 2

## 2022-07-17 MED ORDER — LACTATED RINGERS IV SOLN: INTRAVENOUS | Status: DC

## 2022-07-17 MED ORDER — DOCUSATE SODIUM 100 MG PO CAPS
100.0000 mg | ORAL_CAPSULE | Freq: Every day | ORAL | Status: DC
  Administered 2022-07-17 – 2022-07-18 (×2): 100 mg via ORAL
  Filled 2022-07-17 (×2): qty 1

## 2022-07-17 NOTE — MAU Provider Note (Addendum)
History     CSN: [id number]  Arrival date and time: 07/17/22 0658   None     No chief complaint on file.  HPI Tricia Campbell is a 31 y.o. [id number] at 42w3dwith Di/Di twins presents to MAU via EMS. Patient delivered Twin A en route to hospital. She reports she was here yesterday and was told the sac around baby A had ruptured and she was discharged home with plans to follow up with MFM today. She reports all throughout the night she felt some cramping and then prior to delivery of the baby she had a lot of extreme pain. She currently reports it feels like "there's a bubble in my vagina". Denies vaginal bleeding at this time.   [facility]  OB History     Gravida  4   Para  1   Term  1   Preterm  0   AB  2   Living  1      SAB  0   IAB  2   Ectopic  0   Multiple  0   Live Births  1           Past Medical History:  Diagnosis Date   History of marijuana use 08/17/2016   +UDS at first prenatal visit   Vaginal delivery     Past Surgical History:  Procedure Laterality Date   APPENDECTOMY     LAPAROSCOPIC APPENDECTOMY N/A 07/23/2018   Procedure: APPENDECTOMY LAPAROSCOPIC;  Surgeon: NAlphonsa Overall MD;  Location: [facility];  Service: General;  Laterality: N/A;   WISDOM TOOTH EXTRACTION      Family History  Problem Relation Age of Onset   Asthma Neg Hx    Diabetes Neg Hx    Heart disease Neg Hx    Hypertension Neg Hx    Kidney disease Neg Hx    Stroke Neg Hx     Social History   Tobacco Use   Smoking status: Former    Packs/day: 0.20    Types: Cigars, Cigarettes    Quit date: 12/05/2015    Years since quitting: 6.6   Smokeless tobacco: Never   Tobacco comments:    Black and milds some days  Vaping Use   Vaping Use: Never used  Substance Use Topics   Alcohol use: No   Drug use: No    Allergies:  Allergies  Allergen Reactions   Latex Rash    Medications Prior to Admission  Medication Sig Dispense Refill Last  Dose   aspirin EC 81 MG tablet Take 1 tablet (81 mg total) by mouth daily. Swallow whole. 30 tablet 12    Blood Pressure Monitoring (BLOOD PRESSURE KIT) DEVI 1 Device by Does not apply route as needed. 1 each 0    cyclobenzaprine (FLEXERIL) 5 MG tablet Take 1 tablet (5 mg total) by mouth 3 (three) times daily as needed for muscle spasms. 20 tablet 0    Magnesium 400 MG CAPS Take 1 capsule by mouth daily. 30 capsule 11    promethazine (PHENERGAN) 12.5 MG tablet Take 1 tablet (12.5 mg total) by mouth every 6 (six) hours as needed for nausea or vomiting. 30 tablet 0    Review of Systems  Constitutional: Negative.   Respiratory: Negative.    Cardiovascular: Negative.   Gastrointestinal:  Positive for abdominal pain (cramping).  Genitourinary:  Positive for pelvic pain.  Neurological: Negative.    Physical Exam   Blood pressure 118/73, pulse (!) 109,  temperature 98.1 F (36.7 C), temperature source Oral, resp. rate 14.  Physical Exam Vitals and nursing note reviewed. Exam conducted with a chaperone present.  Constitutional:      General: She is not in acute distress. Eyes:     Extraocular Movements: Extraocular movements intact.     Pupils: Pupils are equal, round, and reactive to light.  Cardiovascular:     Rate and Rhythm: Tachycardia present.  Pulmonary:     Effort: Pulmonary effort is normal.  Abdominal:     Palpations: Abdomen is soft.     Tenderness: There is no abdominal tenderness.  Genitourinary:    Comments: No vaginal bleeding, umbilical cord clamped at introitus Musculoskeletal:        General: Normal range of motion.     Cervical back: Normal range of motion.  Skin:    General: Skin is warm and dry.  Neurological:     General: No focal deficit present.     Mental Status: She is alert and oriented to person, place, and time.  Psychiatric:        Mood and Affect: Mood normal.        Behavior: Behavior normal.    FHR Twin B: 147 bpm via doppler  MAU Course   Procedures  MDM Patient has twin A wrapped in blanket. She appears to be coping well. 31mg Fentanyl ordered IV for pain. Dr. BElgie Congonotified of patient arrival and at bedside to discuss plan of care. [place]ordered.   Assessment and Plan  PPROM  [redacted] weeks gestation Di/di twin gestation Delivery of twin A  [facility] Orders placed by Dr. BWilber Oliphant CNM 07/17/2022, 7:48 AM

## 2022-07-17 NOTE — MAU Note (Signed)
.  Tricia Campbell is a 31 y.o. at 29w3dhere in MAU reporting: arrived EMS, delivered baby A this morning at home at 0600, having some spotting.   Pain score: 10 Vitals:   07/17/22 0707  BP: 118/73  Pulse: (!) 109  Resp: 14  Temp: 98.1 F (36.7 C)     FDJM:EQASB 147

## 2022-07-17 NOTE — Progress Notes (Signed)
Pt voided and had recent fentanyl dose for SSE and procedure to tie off the cord higher up.   On SSE, cervix is visually closed. The cord was tied off with 2-0 silk suture. The cord was then trimmed just below where the cord was tied off.   The speculum was then removed.   The patient tolerated the procedure very well.   Radene Gunning, MD Attending [place], [facility] for [facility], [place]

## 2022-07-17 NOTE — Consult Note (Signed)
MFM Note  Kirk Sampley is a 31 year old gravida 4 para 1 currently at 18 weeks and 3 days.  She was seen in consultation at the request of Dr. Elgie Congo due to a spontaneously conceived dichorionic, diamniotic twin gestation where PPROM of twin A occurred yesterday.  Unfortunately, the patient went into spontaneous labor this morning and delivered twin A (a nonviable fetus).  The patient reports that spontaneous rupture of membranes occurred yesterday.  She had intercourse the day before.  She was evaluated in the MAU yesterday where anhydramnios was noted around twin A.  Her cervix was closed at in the MAU.    As there was nothing that could be done for her due to her early gestational age, plans were made for her to return to the MFM office this morning for an ultrasound exam and consultation.  She subsequently presented to the MAU this morning after delivering twin A at home.  The patient reports 1 prior full-term uncomplicated vaginal delivery.    She denies any significant past medical history.  This is a spontaneously conceived twin pregnancy. She had a cell free DNA test drawn earlier in her pregnancy which indicated that these are dizygotic twins.  The cell free DNA test indicated a low risk for trisomy 21, 18, and 13.  Two female fetuses were predicted.  Currently, the patient denies feeling any contractions or lower abdominal cramping.  She is no longer leaking any fluid.    An ultrasound performed this morning showed that the remaining fetus is in the breech presentation.  The overall EFW was 9 ounces (50th percentile).  There was normal amniotic fluid noted.  There were no obvious fetal anomalies noted on today's exam.  However, the exam was limited due to the fetal position.  Her white blood cell count was elevated this morning (20,500).  The rest of her labs appeared within normal limits.  The patient was advised that as her cervix appears to be closed and she does not appear  to be in active labor, an interval delivery may be attempted following the delivery of twin A.  The umbilical cord for twin A should be tied and cut close to her cervix.    She should be observed in the hospital for 24 to 48 hours to ensure that she does not go into labor and does not show any signs of an intrauterine infection.  The patient understands that usually, the second twin will deliver in a short time following delivery of the first twin.  However in certain cases, an interval delivery may be successful.    The patient is aware that viability is currently considered at between 22 to 23 weeks.  Delivery prior to this time will result in a nonviable infant.  Should she remain undelivered following the brief observation in the hospital and she does not show any signs of an intrauterine infection, outpatient management may be pursued.  The patient already has an ultrasound scheduled in the MFM office next week on July 23, 2022.  We will continue to follow her with frequent ultrasounds for fetal assessment.  A course of antenatal corticosteroids should be administered should she remain undelivered at 23 weeks.    Should she remain stable at 23 weeks, continued outpatient management may be considered as long as she remains stable.  The patient understands that she should return to the hospital should she experience frequent contractions or signs of an intrauterine infection.    The patient is comfortable  and happy with the management plan as outlined above.    She stated that all of her questions were answered today.

## 2022-07-17 NOTE — H&P (Signed)
FAC[facility]ACTICE ANTEPARTUM ADMISSION HISTORY AND PHYSICAL NOTE   History of Present Illness: Tricia Campbell is a 31 y.o. [id number] at 17w3dadmitted for premature rupture of membranes with delivery of twin A.  Twin B is viable and still in utero. Pt was seen the previous day in the MAU where PROM was verified.  Pt was evaluated and then sent home with the expectation of MFM evaluation and detailed ultrasound today.   Per pt she began having contractions this morning and delivered twin A at approximately 0600.  She still reports some pelvic pain. Patient reports uterine contraction  activity as irregular and needing fentanyl for pain control.. Patient reports  vaginal bleeding as spotting. Patient describes fluid per vagina as Other : no apparent leaking of fluid currently. Fetal presentation is unsure.  Patient Active Problem List   Diagnosis Date Noted   Premature rupture of membranes 07/17/2022   Vaginal bleeding in pregnancy 07/16/2022   Dichorionic diamniotic twin pregnancy, antepartum 05/29/2022   Supervision of high risk pregnancy, antepartum 05/16/2022    Past Medical History:  Diagnosis Date   History of marijuana use 08/17/2016   +UDS at first prenatal visit   Vaginal delivery     Past Surgical History:  Procedure Laterality Date   APPENDECTOMY     LAPAROSCOPIC APPENDECTOMY N/A 07/23/2018   Procedure: APPENDECTOMY LAPAROSCOPIC;  Surgeon: NAlphonsa Overall MD;  Location: [facility];  Service: General;  Laterality: N/A;   WISDOM TOOTH EXTRACTION      OB History  Gravida Para Term Preterm AB Living  _0 0 2 1  SAB IAB Ectopic Multiple Live Births  0 2 0 0 1    # Outcome Date GA Lbr Len/2nd Weight Sex Delivery Anes PTL Lv  4 Current           3 IAB 2021          2 IAB 2020          1 Term 02/25/17 [id number]:55 / 00:12 3375 g M Vag-Spont None  LIV    Social History   Socioeconomic History   Marital status: Single    Spouse name: Not on file   Number of  children: Not on file   Years of education: Not on file   Highest education level: Not on file  Occupational History   Not on file  Tobacco Use   Smoking status: Former    Packs/day: 0.20    Types: Cigars, Cigarettes    Quit date: 12/05/2015    Years since quitting: 6.6   Smokeless tobacco: Never   Tobacco comments:    Black and milds some days  Vaping Use   Vaping Use: Never used  Substance and Sexual Activity   Alcohol use: No   Drug use: No   Sexual activity: Not Currently  Other Topics Concern   Not on file  Social History Narrative   Not on file   Social Determinants of Health   Financial Resource Strain: Not on file  Food Insecurity: No Food Insecurity (05/28/2022)   Hunger Vital Sign    Worried About Running Out of Food in the Last Year: Never true    Ran Out of Food in the Last Year: Never true  Transportation Needs: No Transportation Needs (05/28/2022)   PRAPARE - TrHydrologistMedical): No    Lack of Transportation (Non-Medical): No  Physical Activity: Not on file  Stress: Not on file  Social  Connections: Not on file    Family History  Problem Relation Age of Onset   Asthma Neg Hx    Diabetes Neg Hx    Heart disease Neg Hx    Hypertension Neg Hx    Kidney disease Neg Hx    Stroke Neg Hx     Allergies  Allergen Reactions   Latex Rash    Medications Prior to Admission  Medication Sig Dispense Refill Last Dose   aspirin EC 81 MG tablet Take 1 tablet (81 mg total) by mouth daily. Swallow whole. 30 tablet 12    Blood Pressure Monitoring (BLOOD PRESSURE KIT) DEVI 1 Device by Does not apply route as needed. 1 each 0    cyclobenzaprine (FLEXERIL) 5 MG tablet Take 1 tablet (5 mg total) by mouth 3 (three) times daily as needed for muscle spasms. 20 tablet 0    Magnesium 400 MG CAPS Take 1 capsule by mouth daily. 30 capsule 11    promethazine (PHENERGAN) 12.5 MG tablet Take 1 tablet (12.5 mg total) by mouth every 6 (six) hours as  needed for nausea or vomiting. 30 tablet 0     Review of Systems - History obtained from the patient  Vitals:  BP 118/73 (BP Location: Right Arm)   Pulse (!) 109   Temp 98.1 F (36.7 C) (Oral)   Resp 14   LMP  (LMP Unknown)  Physical Examination: CONSTITUTIONAL: Well-developed, well-nourished female in no acute distress.  HENT:  Normocephalic, atraumatic, External right and left ear normal. Oropharynx is clear and moist EYES: Conjunctivae and EOM are normal.  NECK: Normal range of motion, supple, no masses SKIN: Skin is warm and dry. No rash noted. Not diaphoretic. No erythema. No pallor. [place]: Alert and oriented to person, place, and time. Normal reflexes, muscle tone coordination. No cranial nerve deficit noted. PSYCHIATRIC: Normal mood and affect. Normal behavior. Normal judgment and thought content. CARDIOVASCULAR: Normal heart rate noted, regular rhythm RESPIRATORY: Effort and breath sounds normal, no problems with respiration noted ABDOMEN: Soft, nontender, nondistended, gravid. MUSCULOSKELETAL: Normal range of motion. No edema and no tenderness. 2+ distal pulses.  Cervix: exam pending, will be evaluated directly or with ultrasound. Membranes:twin B sac appears to be intact Fetal Monitoring: FHT 147 for twin B Tocometer: irritability with occasional contractions  Labs:  Results for orders placed or performed during the hospital encounter of 07/16/22 (from the past 24 hour(s))  Amnisure rupture of membrane (rom)not at [place] Time: 07/16/22  2:14 PM  Result Value Ref Range   Amnisure ROM POSITIVE   Fern Test   Collection Time: 07/16/22  2:16 PM  Result Value Ref Range   POCT Fern Test Positive = ruptured amniotic membanes     Imaging Studies: [place] MFM OB Limited  Result Date: 07/16/2022 ----------------------------------------------------------------------  OBSTETRICS REPORT                       (Signed Final 07/16/2022 04:13 pm)  ---------------------------------------------------------------------- Patient Info  ID #:       [id number]                          D.O.B.:  [birth year] (31 yrs)  Name:       Tricia Harman-                 Visit Date: 07/16/2022 12:02 pm  Campbell ---------------------------------------------------------------------- Performed By  Attending:        Johnell Comings MD         Referred By:      [facility]/Triage  Performed By:     Valda Favia          [facility]                    [place] ---------------------------------------------------------------------- Orders  #  Description                           Code        Ordered By  1  [place] MFM OB LIMITED                     63893.73    Marcille Buffy ----------------------------------------------------------------------  #  Order #                     Accession #                Episode #  1  [id number]                   [id number]                 [id number] ---------------------------------------------------------------------- Indications  Vaginal bleeding in pregnancy, second          O46.92  trimester  Twin pregnancy, di/di, second trimester        O30.042  [redacted] weeks gestation of pregnancy                Z3A.18  LR NIPS  AFP neg ---------------------------------------------------------------------- Fetal Evaluation (Fetus A)  Num Of Fetuses:         2  Fetal Heart Rate(bpm):  145  Cardiac Activity:       Observed  Presentation:           Cephalic  Placenta:               Anterior  P. Cord Insertion:      Not well visualized  Amniotic Fluid  AFI FV:      Anhydramnios ---------------------------------------------------------------------- OB History  Gravidity:    4         Term:   1        Prem:   0        SAB:   0  TOP:          2       Ectopic:  0        Living: 1 ---------------------------------------------------------------------- Gestational Age (Fetus A)  Best:          18w 2d     Det. ByLoman Chroman         EDD:   12/15/22                                      (04/22/22) ---------------------------------------------------------------------- Anatomy (Fetus A)  Cranium:  Appears normal         Stomach:                Appears normal, left                                                                        sided  Thoracic:              Appears normal         Bladder:                Appears normal  Diaphragm:             Appears normal ---------------------------------------------------------------------- Fetal Evaluation (Fetus B)  Num Of Fetuses:         2  Fetal Heart Rate(bpm):  145  Cardiac Activity:       Observed  Fetal Lie:              Maternal right side  Presentation:           Cephalic  Placenta:               Anterior  P. Cord Insertion:      Not well visualized  Amniotic Fluid  AFI FV:      Within normal limits                              Largest Pocket(cm)                              4. ---------------------------------------------------------------------- Gestational Age (Fetus B)  Best:          18w 2d     Det. By:  Loman Chroman         EDD:   12/15/22                                      (04/22/22) ---------------------------------------------------------------------- Anatomy (Fetus B)  Cranium:               Appears normal         Stomach:                Appears normal, left                                                                        sided  Thoracic:              Appears normal         Bladder:                Appears normal ---------------------------------------------------------------------- Cervix Uterus Adnexa  Cervix  Length:            3.5  cm.  Closed  Uterus  No abnormality  visualized.  Right Ovary  No adnexal mass visualized.  Left Ovary  No adnexal mass visualized.  Cul De Sac  No free fluid seen.  Adnexa  No abnormality visualized. ---------------------------------------------------------------------- Comments  This patient presented to the  MAU due to vaginal bleeding.  The patient had a cell free DNA test drawn in her current  pregnancy indicating that these are dizygotic or fraternal  twins.  The cell free DNA test indicated a low risk for trisomy  21, 18, and 13.  Two female fetuses are predicted.  Anhydramnios is noted in twin A.  There was normal amniotic  fluid noted around twin B.  The patient was found to be grossly ruptured in the MAU.  At her current gestational age (18+ weeks), there is nothing  that can be done for rupture of membranes at this time.  The patient is already scheduled tomorrow in the MFM office  for another ultrasound and consultation to discuss the  management of PPROM in twin A. ----------------------------------------------------------------------                   Johnell Comings, MD Electronically Signed Final Report   07/16/2022 04:13 pm ----------------------------------------------------------------------    Assessment and Plan: Patient Active Problem List   Diagnosis Date Noted   Premature rupture of membranes 07/17/2022   Vaginal bleeding in pregnancy 07/16/2022   Dichorionic diamniotic twin pregnancy, antepartum 05/29/2022   Supervision of high risk pregnancy, antepartum 05/16/2022   Admit to Antenatal Will get detailed ultrasound of twin B MFM consult ordered, will call directly after AM rounds FHT q shift, toco continuous for now then prn Would keep pt in house until 23 weeks and then get NICU consult and initiate BMZ    Lynnda Shields, MD Faculty attending [facility] for [facility]

## 2022-07-18 DIAGNOSIS — O30042 Twin pregnancy, dichorionic/diamniotic, second trimester: Secondary | ICD-10-CM | POA: Diagnosis not present

## 2022-07-18 DIAGNOSIS — Z3A18 18 weeks gestation of pregnancy: Secondary | ICD-10-CM | POA: Diagnosis not present

## 2022-07-18 DIAGNOSIS — O42012 Preterm premature rupture of membranes, onset of labor within 24 hours of rupture, second trimester: Secondary | ICD-10-CM | POA: Diagnosis not present

## 2022-07-18 LAB — CBC WITH DIFFERENTIAL/PLATELET
Abs Immature Granulocytes: 0.11 10*3/uL — ABNORMAL HIGH (ref 0.00–0.07)
Basophils Absolute: 0 10*3/uL (ref 0.0–0.1)
Basophils Relative: 0 %
Eosinophils Absolute: 0.2 10*3/uL (ref 0.0–0.5)
Eosinophils Relative: 1 %
HCT: 29.1 % — ABNORMAL LOW (ref 36.0–46.0)
Hemoglobin: 10 g/dL — ABNORMAL LOW (ref 12.0–15.0)
Immature Granulocytes: 1 %
Lymphocytes Relative: 11 %
Lymphs Abs: 1.8 10*3/uL (ref 0.7–4.0)
MCH: 29.6 pg (ref 26.0–34.0)
MCHC: 34.4 g/dL (ref 30.0–36.0)
MCV: 86.1 fL (ref 80.0–100.0)
Monocytes Absolute: 2 10*3/uL — ABNORMAL HIGH (ref 0.1–1.0)
Monocytes Relative: 13 %
Neutro Abs: 11.5 10*3/uL — ABNORMAL HIGH (ref 1.7–7.7)
Neutrophils Relative %: 74 %
Platelets: 225 10*3/uL (ref 150–400)
RBC: 3.38 MIL/uL — ABNORMAL LOW (ref 3.87–5.11)
RDW: 14 % (ref 11.5–15.5)
WBC: 15.6 10*3/uL — ABNORMAL HIGH (ref 4.0–10.5)
nRBC: 0 % (ref 0.0–0.2)

## 2022-07-18 MED ORDER — PRENATAL MULTIVITAMIN CH: 1.0000 | ORAL_TABLET | Freq: Every day | ORAL | 3 refills | Status: DC

## 2022-07-18 MED ORDER — OXYCODONE HCL 5 MG PO TABS: 5.0000 mg | ORAL_TABLET | ORAL | 0 refills | Status: DC | PRN

## 2022-07-18 MED ORDER — CYCLOBENZAPRINE HCL 10 MG PO TABS: 10.0000 mg | ORAL_TABLET | Freq: Three times a day (TID) | ORAL | 2 refills | Status: DC | PRN

## 2022-07-18 MED ORDER — ACETAMINOPHEN 500 MG PO TABS: 500.0000 mg | ORAL_TABLET | Freq: Four times a day (QID) | ORAL | 0 refills | Status: DC | PRN

## 2022-07-18 NOTE — Discharge Summary (Signed)
Antenatal Physician Discharge Summary  Patient ID: Tricia Campbell MRN: [id number] DOB/AGE: 1990-08-27 31 y.o.  Admit date: 07/17/2022 Discharge date: 07/18/2022  Admission Diagnoses: Premature rupture of membranes [O42.90] Didi twin gestation Pregnancy with 33 completed weeks Miscarriage of Twin A   Discharge Diagnoses:  Premature rupture of membranes [O42.90] Didi twin gestation Pregnancy with 75 completed weeks Miscarriage of Twin A   Prenatal Procedures: ultrasound  Consults: Maternal Fetal Medicine  Hospital Course:  Tricia Campbell is a 31 y.o. [phone] with IUP at 14w4dadmitted for monitoring following delivery of twin A at home in the setting of PPROM c/w SAB (prior to 20 weeks).  She was admitted for monitoring. Dr. FAnnamaria Bootsand I saw the patient and reasonable wishes to try for interval delivery of Twin B. The cord from Twin A was tied off and shortened and at this time her cervix was visually closed. She had an ultrasound of the remaining twin and it was normal. Cervix appeared closed by [place].  Her CBC was trended and improved from 20 to 15. She had some initial bleeding that was light following the delivery but that stopped overnight. She continues to have cramping. She had no symptoms of infection.  She was deemed stable for discharge to home with outpatient follow up - she has another [place] week with MFM and will do weekly appointments with [place] weekly labs initially.   She was cautioned on risk of loss at home due to lack of available interventions. She will return if any signs or symptoms of infection or bleeding.   She is Rh positive.   Discharge Exam: Temp:  [98 F (36.7 C)-98.2 F (36.8 C)] 98 F (36.7 C) (12/13 0752) Pulse Rate:  [83-90] 83 (12/13 0752) Resp:  [13-17] 13 (12/13 0752) BP: (92-109)/(44-60) 92/44 (12/13 0752) SpO2:  [99 %-100 %] 99 % (12/13 0752) Physical Examination: CONSTITUTIONAL: Well-developed, well-nourished female  in no acute distress.  HENT:  Normocephalic, atraumatic, External right and left ear normal.  EYES: Conjunctivae and EOM are normal. Pupils are equal, round, and reactive to light. No scleral icterus.  NECK: Normal range of motion, supple, no masses SKIN: Skin is warm and dry. No rash noted. Not diaphoretic. No erythema. No pallor. NEUROLOGIC: Alert and oriented to person, place, and time. Normal reflexes, muscle tone coordination. No cranial nerve deficit noted. PSYCHIATRIC: Normal mood and affect. Normal behavior. Normal judgment and thought content. CARDIOVASCULAR: Normal heart rate noted, regular rhythm RESPIRATORY: Effort and breath sounds normal, no problems with respiration noted MUSCULOSKELETAL: Normal range of motion. No edema and no tenderness. 2+ distal pulses. ABDOMEN: Soft, nontender, nondistended, gravid. CERVIX:  Closed visually on 12/12.     Significant Diagnostic Studies:  Results for orders placed or performed during the hospital encounter of 07/17/22 (from the past 168 hour(s))  Type and screen [facility]  Collection Time: 07/17/22  7:59 AM  Result Value Ref Range   ABO/RH(D) B POS    Antibody Screen NEG    Sample Expiration      07/20/2022,2359 Performed at [facility] [id number] [place] [id number]  CBC   Collection Time: 07/17/22  8:01 AM  Result Value Ref Range   WBC 20.5 (H) 4.0 - 10.5 K/uL   RBC 3.45 (L) 3.87 - 5.11 MIL/uL   Hemoglobin 10.0 (L) 12.0 - 15.0 g/dL   HCT 30.5 (L) 36.0 - 46.0 %   MCV 88.4 80.0 - 100.0  fL   MCH 29.0 26.0 - 34.0 pg   MCHC 32.8 30.0 - 36.0 g/dL   RDW 14.0 11.5 - 15.5 %   Platelets 257 150 - 400 K/uL   nRBC 0.0 0.0 - 0.2 %  Rapid urine drug screen (hospital performed) Not at [facility]   Collection Time: 07/17/22 10:59 AM  Result Value Ref Range   Opiates NONE DETECTED NONE DETECTED   Cocaine NONE DETECTED NONE DETECTED   Benzodiazepines NONE DETECTED NONE DETECTED   Amphetamines NONE  DETECTED NONE DETECTED   Tetrahydrocannabinol POSITIVE (A) NONE DETECTED   Barbiturates NONE DETECTED NONE DETECTED  Urinalysis, Routine w reflex microscopic Urine, Random   Collection Time: 07/17/22 10:59 AM  Result Value Ref Range   Color, Urine YELLOW YELLOW   APPearance CLEAR CLEAR   Specific Gravity, Urine 1.008 1.005 - 1.030   pH 8.0 5.0 - 8.0   Glucose, UA NEGATIVE NEGATIVE mg/dL   Hgb urine dipstick MODERATE (A) NEGATIVE   Bilirubin Urine NEGATIVE NEGATIVE   Ketones, ur 20 (A) NEGATIVE mg/dL   Protein, ur NEGATIVE NEGATIVE mg/dL   Nitrite NEGATIVE NEGATIVE   Leukocytes,Ua MODERATE (A) NEGATIVE   RBC / HPF 11-20 0 - 5 RBC/hpf   WBC, UA 11-20 0 - 5 WBC/hpf   Bacteria, UA RARE (A) NONE SEEN   Squamous Epithelial / LPF 0-5 0 - 5   Mucus PRESENT   CBC with Differential/Platelet   Collection Time: 07/18/22  4:42 AM  Result Value Ref Range   WBC 15.6 (H) 4.0 - 10.5 K/uL   RBC 3.38 (L) 3.87 - 5.11 MIL/uL   Hemoglobin 10.0 (L) 12.0 - 15.0 g/dL   HCT 29.1 (L) 36.0 - 46.0 %   MCV 86.1 80.0 - 100.0 fL   MCH 29.6 26.0 - 34.0 pg   MCHC 34.4 30.0 - 36.0 g/dL   RDW 14.0 11.5 - 15.5 %   Platelets 225 150 - 400 K/uL   nRBC 0.0 0.0 - 0.2 %   Neutrophils Relative % 74 %   Neutro Abs 11.5 (H) 1.7 - 7.7 K/uL   Lymphocytes Relative 11 %   Lymphs Abs 1.8 0.7 - 4.0 K/uL   Monocytes Relative 13 %   Monocytes Absolute 2.0 (H) 0.1 - 1.0 K/uL   Eosinophils Relative 1 %   Eosinophils Absolute 0.2 0.0 - 0.5 K/uL   Basophils Relative 0 %   Basophils Absolute 0.0 0.0 - 0.1 K/uL   Immature Granulocytes 1 %   Abs Immature Granulocytes 0.11 (H) 0.00 - 0.07 K/uL  Results for orders placed or performed during the hospital encounter of 07/16/22 (from the past 168 hour(s))  Amnisure rupture of membrane (rom)not at [facility]   Collection Time: 07/16/22  2:14 PM  Result Value Ref Range   Amnisure ROM POSITIVE   Fern Test   Collection Time: 07/16/22  2:16 PM  Result Value Ref Range   POCT Fern Test  Positive = ruptured amniotic membanes   GC/Chlamydia probe amp ([place])not at [facility]   Collection Time: 07/16/22  2:30 PM  Result Value Ref Range   Neisseria Gonorrhea Negative    Chlamydia Negative    Comment Normal Reference Ranger Chlamydia - Negative    Comment      Normal Reference Range Neisseria Gonorrhea - Negative   [place] MFM OB DETAIL +14 WK  Result Date: 07/17/2022 ----------------------------------------------------------------------  OBSTETRICS REPORT                        (  Signed Final 07/17/2022 01:15 pm) ---------------------------------------------------------------------- Patient Info  ID #:       [id number]                          D.O.B.:  [birth year] (31 yrs)  Name:       Tricia RUBERG-                 Visit Date: 07/17/2022 07:21 am              Campbell ---------------------------------------------------------------------- Performed By  Attending:        Johnell Comings MD         Referred By:       [facility] MAU/Triage  Performed By:     Valda Favia          [facility]                    [place] ---------------------------------------------------------------------- Orders  #  Description                           Code        Ordered By  1  [place] MFM OB DETAIL +14 [place]               32992.42    DANIELLE SIMPSON ----------------------------------------------------------------------  #  Order #                     Accession #                Episode #  1  [id number]                   [id number]                 [id number] ---------------------------------------------------------------------- Indications  Twin pregnancy with loss (demise) of one        O42.009, O31.10X0  fetus, antepartum (delivered)  Twin pregnancy, di/di, second trimester         O30.042  Vaginal bleeding in pregnancy, second           O46.92  trimester  LR NIPS  AFP neg  [redacted] weeks gestation of pregnancy                 Z3A.18  ---------------------------------------------------------------------- Fetal Evaluation  Num Of Fetuses:          1  Fetal Heart Rate(bpm):   141  Cardiac Activity:        Observed  Presentation:            Breech  Placenta:                Anterior  P. Cord Insertion:       Visualized, central  Amniotic Fluid  AFI FV:      Within normal limits                              Largest Pocket(cm)  4.9 ---------------------------------------------------------------------- Biometry  BPD:      42.8  mm     G. Age:  19w 0d         73  %    CI:          79.9  %    70 - 86                                                          FL/HC:       18.2  %    15.8 - 18  HC:      151.3  mm     G. Age:  18w 1d         29  %    HC/AC:       1.15       1.07 - 1.29  AC:        131  mm     G. Age:  18w 4d         54  %    FL/BPD:      64.3  %  FL:       27.5  mm     G. Age:  18w 3d         19  %    FL/AC:       21.0  %    20 - 24  HUM:      26.8  mm     G. Age:  18w 4d         56  %  Est. FW:     243   gm     0 lb 9 oz     50  % ---------------------------------------------------------------------- OB History  Gravidity:    4         Term:   1        Prem:   0        SAB:   0  TOP:          2       Ectopic:  0        Living: 1 ---------------------------------------------------------------------- Gestational Age  U/S Today:     18w 4d                                        EDD:   12/14/22  Best:          18w 3d     Det. ByLoman Chroman         EDD:   12/15/22                                      (04/22/22) ---------------------------------------------------------------------- Anatomy  Cranium:               Appears normal         LVOT:                   Not well visualized  Cavum:                 Appears normal  Aortic Arch:            Not well visualized  Ventricles:            Appears normal         Ductal Arch:            Appears normal  Choroid Plexus:        Appears normal         Diaphragm:               Appears normal  Cerebellum:            Appears normal         Stomach:                Appears normal, left                                                                        sided  Posterior Fossa:       Appears normal         Abdomen:                Appears normal  Nuchal Fold:           Appears normal         Abdominal Wall:         Appears nml (cord                                                                        insert, abd wall)  Face:                  Orbits nl; profile not Cord Vessels:           Appears normal (3                         well visualized                                vessel cord)  Lips:                  Not well visualized    Kidneys:                Appear normal  Palate:                Not well visualized    Bladder:                Appears normal  Thoracic:              Appears normal         Spine:                  Appears normal  Heart:                 Not well visualized  Upper Extremities:      Appears normal  RVOT:                  Not well visualized    Lower Extremities:      Appears normal  Other:  Fetus appears to be female. Hands visualized. Feet not well          visualized. Technically difficult due to fetal position. ---------------------------------------------------------------------- Cervix Uterus Adnexa  Cervix  Length:            3.5  cm.  Closed  Uterus  No abnormality visualized.  Right Ovary  No adnexal mass visualized.  Left Ovary  No adnexal mass visualized.  Cul De Sac  No free fluid seen.  Adnexa  No abnormality visualized. ---------------------------------------------------------------------- Comments  Tricia Campbell is a 31 year old gravida 4 para 1  currently at 18 weeks and 3 days.  She was seen in  consultation at the request of Dr. Elgie Congo due to a  spontaneously conceived dichorionic, diamniotic twin  gestation where PPROM of twin A occurred yesterday.  Unfortunately, the patient went into spontaneous labor this  morning and delivered  twin A (a nonviable fetus).  The patient reports that spontaneous rupture of membranes  occurred yesterday.  She had intercourse the day before.  She was evaluated in the MAU yesterday where  anhydramnios was noted around twin A.  Her cervix was  closed at in the MAU.  As there was nothing that could be done for her due to her  early gestational age, plans were made for her to return to  the MFM office this morning for an ultrasound exam and  consultation.  She subsequently presented to the MAU this  morning after delivering twin A at home.  The patient reports 1 prior full-term uncomplicated vaginal  delivery.  She denies any significant past medical history.  This is a spontaneously conceived twin pregnancy. She had  a cell free DNA test drawn earlier in her pregnancy which  indicated that these are dizygotic twins.  The cell free DNA  test indicated a low risk for trisomy 21, 18, and 13.  Two  female fetuses were predicted.  Currently, the patient denies feeling any contractions or lower  abdominal cramping.  She is no longer leaking any fluid.  An ultrasound performed this morning showed that the  remaining fetus is in the breech presentation.  The overall  EFW was 9 ounces (50th percentile).  There was normal  amniotic fluid noted.  There were no obvious fetal anomalies  noted on today's exam.  However, the exam was limited due  to the fetal position.  Her white blood cell count was elevated this morning  (20,500).  The rest of her labs appeared within normal limits.  The patient was advised that as her cervix appears to be  closed and she does not appear to be in active labor, an  interval delivery may be attempted following the delivery of  twin A.  The umbilical cord for twin A should be tied and cut  close to her cervix.  She should be observed in the hospital for 24 to 48 hours to  ensure that she does not go into labor and does not show  any signs of an intrauterine infection.  The patient understands  that usually, the second twin will  deliver in a short time following delivery of the first twin.  However in certain cases, an interval delivery may be  successful.  The patient is aware that viability is currently considered at  between 22 to 23 weeks.  Delivery prior to this time will result  in a nonviable infant.  Should she remain undelivered following the brief observation  in the hospital and she does not show any signs of an  intrauterine infection, outpatient management may be  pursued.  The patient already has an ultrasound scheduled in the MFM  office next week on July 23, 2022.  We will continue to follow her with frequent ultrasounds for  fetal assessment.  A course of antenatal corticosteroids should be administered  should she remain undelivered at 23 weeks.  Should she remain stable at 23 weeks, continued outpatient  management may be considered as long as she remains  stable.  The patient understands that she should return to the hospital  should she experience frequent contractions or signs of an  intrauterine infection.  The patient is comfortable and happy with the management  plan as outlined above.  She stated that all of her questions were answered today. ----------------------------------------------------------------------                   Johnell Comings, MD Electronically Signed Final Report   07/17/2022 01:15 pm ----------------------------------------------------------------------  [place] MFM OB Limited  Result Date: 07/16/2022 ----------------------------------------------------------------------  OBSTETRICS REPORT                       (Signed Final 07/16/2022 04:13 pm) ---------------------------------------------------------------------- Patient Info  ID #:       [id number]                          D.O.B.:  August 20, 1990 (31 yrs)  Name:       Tricia Harman-                 Visit Date: 07/16/2022 12:02 pm              Campbell  ---------------------------------------------------------------------- Performed By  Attending:        Johnell Comings MD         Referred By:      [facility] MAU/Triage  Performed By:     Valda Favia          Location:         [facility]                    [place] ---------------------------------------------------------------------- Orders  #  Description                           Code        Ordered By  1  [place] MFM OB LIMITED                     37858.85    Marcille Buffy ----------------------------------------------------------------------  #  Order #                     Accession #                Episode #  1  [id number]  [id number]                 [id number] ---------------------------------------------------------------------- Indications  Vaginal bleeding in pregnancy, second          O46.92  trimester  Twin pregnancy, di/di, second trimester        O30.042  [redacted] weeks gestation of pregnancy                Z3A.18  LR NIPS  AFP neg ---------------------------------------------------------------------- Fetal Evaluation (Fetus A)  Num Of Fetuses:         2  Fetal Heart Rate(bpm):  145  Cardiac Activity:       Observed  Presentation:           Cephalic  Placenta:               Anterior  P. Cord Insertion:      Not well visualized  Amniotic Fluid  AFI FV:      Anhydramnios ---------------------------------------------------------------------- OB History  Gravidity:    4         Term:   1        Prem:   0        SAB:   0  TOP:          2       Ectopic:  0        Living: 1 ---------------------------------------------------------------------- Gestational Age (Fetus A)  Best:          18w 2d     Det. ByLoman Chroman         EDD:   12/15/22                                      (04/22/22) ---------------------------------------------------------------------- Anatomy (Fetus A)  Cranium:               Appears normal         Stomach:                Appears  normal, left                                                                        sided  Thoracic:              Appears normal         Bladder:                Appears normal  Diaphragm:             Appears normal ---------------------------------------------------------------------- Fetal Evaluation (Fetus B)  Num Of Fetuses:         2  Fetal Heart Rate(bpm):  145  Cardiac Activity:       Observed  Fetal Lie:              Maternal right side  Presentation:           Cephalic  Placenta:               Anterior  P. Cord Insertion:      Not well visualized  Amniotic Fluid  AFI FV:  Within normal limits                              Largest Pocket(cm)                              4. ---------------------------------------------------------------------- Gestational Age (Fetus B)  Best:          18w 2d     Det. By:  Loman Chroman         EDD:   12/15/22                                      (04/22/22) ---------------------------------------------------------------------- Anatomy (Fetus B)  Cranium:               Appears normal         Stomach:                Appears normal, left                                                                        sided  Thoracic:              Appears normal         Bladder:                Appears normal ---------------------------------------------------------------------- Cervix Uterus Adnexa  Cervix  Length:            3.5  cm.  Closed  Uterus  No abnormality visualized.  Right Ovary  No adnexal mass visualized.  Left Ovary  No adnexal mass visualized.  Cul De Sac  No free fluid seen.  Adnexa  No abnormality visualized. ---------------------------------------------------------------------- Comments  This patient presented to the MAU due to vaginal bleeding.  The patient had a cell free DNA test drawn in her current  pregnancy indicating that these are dizygotic or fraternal  twins.  The cell free DNA test indicated a low risk for trisomy  21, 18, and 13.  Two female fetuses are  predicted.  Anhydramnios is noted in twin A.  There was normal amniotic  fluid noted around twin B.  The patient was found to be grossly ruptured in the MAU.  At her current gestational age (18+ weeks), there is nothing  that can be done for rupture of membranes at this time.  The patient is already scheduled tomorrow in the MFM office  for another ultrasound and consultation to discuss the  management of PPROM in twin A. ----------------------------------------------------------------------                   Johnell Comings, MD Electronically Signed Final Report   07/16/2022 04:13 pm ----------------------------------------------------------------------   Future Appointments  Date Time Provider [place]  08/13/2022 10:55 AM Manya Silvas, CNM [facility]    Discharge Condition: Stable  Discharge disposition: 01-Home or Self Care       Discharge Instructions     Activity as tolerated - No restrictions   Complete by: As directed    Call MD  for:   Complete by: As directed    Decreased fetal movement, contractions, and vaginal bleeding.   Call MD for:  difficulty breathing, headache or visual disturbances   Complete by: As directed    Call MD for:  persistant nausea and vomiting   Complete by: As directed    Call MD for:  redness, tenderness, or signs of infection (pain, swelling, redness, odor or green/yellow discharge around incision site)   Complete by: As directed    Call MD for:  severe uncontrolled pain   Complete by: As directed    Call MD for:  temperature >100.4   Complete by: As directed    Diet general   Complete by: As directed    May shower / Bathe   Complete by: As directed    Sexual Activity Restrictions   Complete by: As directed    No sexual activity until cleared      Allergies as of 07/18/2022       Reactions   Latex Rash        Medication List     TAKE these medications    acetaminophen 500 MG tablet Commonly known as: TYLENOL Take 1  tablet (500 mg total) by mouth every 6 (six) hours as needed for mild pain or moderate pain.   aspirin EC 81 MG tablet Take 1 tablet (81 mg total) by mouth daily. Swallow whole.   Blood Pressure Kit Devi 1 Device by Does not apply route as needed.   cyclobenzaprine 10 MG tablet Commonly known as: FLEXERIL Take 1 tablet (10 mg total) by mouth 3 (three) times daily as needed for muscle spasms. What changed:  medication strength how much to take   Magnesium 400 MG Caps Take 1 capsule by mouth daily.   oxyCODONE 5 MG immediate release tablet Commonly known as: Oxy IR/ROXICODONE Take 1 tablet (5 mg total) by mouth every 4 (four) hours as needed for breakthrough pain or severe pain.   prenatal multivitamin Tabs tablet Take 1 tablet by mouth daily at 12 noon. Start taking on: July 19, 2022   promethazine 12.5 MG tablet Commonly known as: PHENERGAN Take 1 tablet (12.5 mg total) by mouth every 6 (six) hours as needed for nausea or vomiting.        Follow-up [facility] Follow up in 1 week(s).   Specialty: Obstetrics and Gynecology Why: You should get a message regarding an appointment in the office. Contact information: [place] [address] [phone]                Total discharge time: 45 minutes   Signed: Radene Gunning M.D. 07/18/2022, 12:37 PM

## 2022-07-19 ENCOUNTER — Other Ambulatory Visit: Payer: Self-pay

## 2022-07-19 ENCOUNTER — Inpatient Hospital Stay (HOSPITAL_COMMUNITY)
Admission: AD | Admit: 2022-07-19 | Discharge: 2022-07-20 | DRG: 779 | Disposition: A | Discharge status: Home / Self Care | Payer: Medicaid Other | Attending: Obstetrics and Gynecology | Admitting: Obstetrics and Gynecology

## 2022-07-19 ENCOUNTER — Encounter (HOSPITAL_COMMUNITY): Payer: Self-pay | Admitting: Obstetrics & Gynecology

## 2022-07-19 DIAGNOSIS — O021 Missed abortion: Secondary | ICD-10-CM | POA: Diagnosis present

## 2022-07-19 DIAGNOSIS — Z87891 Personal history of nicotine dependence: Secondary | ICD-10-CM | POA: Diagnosis not present

## 2022-07-19 DIAGNOSIS — O42012 Preterm premature rupture of membranes, onset of labor within 24 hours of rupture, second trimester: Secondary | ICD-10-CM

## 2022-07-19 DIAGNOSIS — D62 Acute posthemorrhagic anemia: Secondary | ICD-10-CM | POA: Diagnosis not present

## 2022-07-19 DIAGNOSIS — O039 Complete or unspecified spontaneous abortion without complication: Secondary | ICD-10-CM | POA: Diagnosis present

## 2022-07-19 DIAGNOSIS — Z3A18 18 weeks gestation of pregnancy: Secondary | ICD-10-CM

## 2022-07-19 DIAGNOSIS — O3122X2 Continuing pregnancy after intrauterine death of one fetus or more, second trimester, fetus 2: Secondary | ICD-10-CM

## 2022-07-19 DIAGNOSIS — O411222 Chorioamnionitis, second trimester, fetus 2: Secondary | ICD-10-CM

## 2022-07-19 DIAGNOSIS — O30049 Twin pregnancy, dichorionic/diamniotic, unspecified trimester: Secondary | ICD-10-CM

## 2022-07-19 LAB — TYPE AND SCREEN
ABO/RH(D): B POS
Antibody Screen: NEGATIVE

## 2022-07-19 MED ORDER — HYDROMORPHONE HCL 1 MG/ML IJ SOLN
2.0000 mg | INTRAMUSCULAR | Status: DC | PRN
  Administered 2022-07-19: 1 mg via INTRAVENOUS
  Administered 2022-07-19: 2 mg via INTRAVENOUS
  Filled 2022-07-19 (×2): qty 2

## 2022-07-19 MED ORDER — SIMETHICONE 80 MG PO CHEW: 80.0000 mg | CHEWABLE_TABLET | ORAL | Status: DC | PRN

## 2022-07-19 MED ORDER — WITCH HAZEL-GLYCERIN EX PADS: 1.0000 | MEDICATED_PAD | CUTANEOUS | Status: DC | PRN

## 2022-07-19 MED ORDER — ACETAMINOPHEN 325 MG PO TABS
650.0000 mg | ORAL_TABLET | ORAL | Status: DC | PRN
  Administered 2022-07-19 – 2022-07-20 (×3): 650 mg via ORAL
  Filled 2022-07-19: qty 2

## 2022-07-19 MED ORDER — CALCIUM CARBONATE ANTACID 500 MG PO CHEW: 2.0000 | CHEWABLE_TABLET | ORAL | Status: DC | PRN

## 2022-07-19 MED ORDER — METHYLERGONOVINE MALEATE 0.2 MG/ML IJ SOLN
0.2000 mg | Freq: Once | INTRAMUSCULAR | Status: AC
  Administered 2022-07-19: 0.2 mg via INTRAMUSCULAR
  Filled 2022-07-19: qty 1

## 2022-07-19 MED ORDER — DIBUCAINE (PERIANAL) 1 % EX OINT: 1.0000 | TOPICAL_OINTMENT | CUTANEOUS | Status: DC | PRN

## 2022-07-19 MED ORDER — LACTATED RINGERS IV SOLN: 125.0000 mL/h | INTRAVENOUS | Status: AC

## 2022-07-19 MED ORDER — FENTANYL CITRATE (PF) 100 MCG/2ML IJ SOLN
100.0000 ug | Freq: Once | INTRAMUSCULAR | Status: AC
  Administered 2022-07-19: 100 ug via INTRAVENOUS
  Filled 2022-07-19: qty 2

## 2022-07-19 MED ORDER — SENNOSIDES-DOCUSATE SODIUM 8.6-50 MG PO TABS
2.0000 | ORAL_TABLET | Freq: Every day | ORAL | Status: DC
  Administered 2022-07-20: 2 via ORAL
  Filled 2022-07-19: qty 2

## 2022-07-19 MED ORDER — DIPHENHYDRAMINE HCL 25 MG PO CAPS: 25.0000 mg | ORAL_CAPSULE | Freq: Four times a day (QID) | ORAL | Status: DC | PRN

## 2022-07-19 MED ORDER — DOCUSATE SODIUM 100 MG PO CAPS
100.0000 mg | ORAL_CAPSULE | Freq: Every day | ORAL | Status: DC
  Administered 2022-07-20: 100 mg via ORAL
  Filled 2022-07-19: qty 1

## 2022-07-19 MED ORDER — BENZOCAINE-MENTHOL 20-0.5 % EX AERO: 1.0000 | INHALATION_SPRAY | CUTANEOUS | Status: DC | PRN

## 2022-07-19 MED ORDER — IBUPROFEN 600 MG PO TABS
600.0000 mg | ORAL_TABLET | Freq: Four times a day (QID) | ORAL | Status: DC
  Administered 2022-07-19 – 2022-07-20 (×4): 600 mg via ORAL
  Filled 2022-07-19 (×5): qty 1

## 2022-07-19 MED ORDER — ACETAMINOPHEN 325 MG PO TABS
650.0000 mg | ORAL_TABLET | ORAL | Status: DC | PRN
  Filled 2022-07-19 (×2): qty 2

## 2022-07-19 MED ORDER — SODIUM CHLORIDE 0.9 % IV SOLN
25.0000 mg | Freq: Once | INTRAVENOUS | Status: AC
  Administered 2022-07-19: 25 mg via INTRAVENOUS
  Filled 2022-07-19: qty 1

## 2022-07-19 MED ORDER — PRENATAL MULTIVITAMIN CH
1.0000 | ORAL_TABLET | Freq: Every day | ORAL | Status: DC
  Administered 2022-07-20: 1 via ORAL
  Filled 2022-07-19: qty 1

## 2022-07-19 MED ORDER — LACTATED RINGERS IV BOLUS
1000.0000 mL | Freq: Once | INTRAVENOUS | Status: AC
  Administered 2022-07-19: 1000 mL via INTRAVENOUS

## 2022-07-19 MED ORDER — ONDANSETRON HCL 4 MG PO TABS: 4.0000 mg | ORAL_TABLET | ORAL | Status: DC | PRN

## 2022-07-19 MED ORDER — PRENATAL MULTIVITAMIN CH: 1.0000 | ORAL_TABLET | Freq: Every day | ORAL | Status: DC

## 2022-07-19 MED ORDER — ONDANSETRON HCL 4 MG/2ML IJ SOLN: 4.0000 mg | INTRAMUSCULAR | Status: DC | PRN

## 2022-07-19 MED ORDER — COCONUT OIL OIL: 1.0000 | TOPICAL_OIL | Status: DC | PRN

## 2022-07-19 MED ORDER — ZOLPIDEM TARTRATE 5 MG PO TABS
5.0000 mg | ORAL_TABLET | Freq: Every evening | ORAL | Status: DC | PRN
  Administered 2022-07-20: 5 mg via ORAL
  Filled 2022-07-19: qty 1

## 2022-07-19 NOTE — H&P (Signed)
Tricia Campbell is a 31 y.o. female presenting at 18.5 weeks, with twin pregnancy,for preterm labor.  She delivered twin A two days ago and was discharged yesterday with plans for attempt of interval delivery of Twin B.  The cord from Twin A was tied off and shortened and prior to delivery her cervix was visually closed. US revealed normal findings of Twin B.   She reports tonight she woke with "20/10" pain, but had been experiencing pain throughout the day.  She reports she started to have some vaginal bleeding and became concerned.  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 Social History:  reports that she quit smoking about 6 years ago. Her smoking use included cigars and cigarettes. She smoked an average of .2 packs per day. She has never used smokeless tobacco. She reports that she does not drink alcohol and does not use drugs.      Review of Systems  Constitutional:  Negative for chills and fever.  Genitourinary:  Positive for vaginal bleeding. Negative for difficulty urinating and dysuria.   No results found for this or any previous visit (from the past 24 hour(s)). No results found. Vitals:   07/19/22 0357  BP: 124/67  Pulse: (!) 113  SpO2: 100%    Physical Exam Vitals reviewed. Exam conducted with a chaperone present.  Constitutional:      General: She is in acute distress.     Appearance:  Normal appearance.  HENT:     Head: Normocephalic and atraumatic.  Eyes:     Conjunctiva/sclera: Conjunctivae normal.  Cardiovascular:     Rate and Rhythm: Normal rate.  Genitourinary:    Comments: Sterile Speculum Exam: -Normal External Genitalia: Non tender, Milky white discharge at introitus.  -Vaginal Vault: Pink mucosa with good rugae. Moderate amt milky whitish yellow discharge and some blood noted. -Cervix:Pink, no lesions, cysts, or polyps.  Appears open to ~3-4cm.  Amniotic sac noted at os.   BME: Deferred  Musculoskeletal:     Cervical back: Normal range of motion.  Skin:    General: Skin is warm and dry.  Neurological:     Mental Status: She is alert and oriented to person, place, and time.  Psychiatric:        Mood and Affect: Mood normal.        Behavior: Behavior normal.     Prenatal labs: ABO, Rh: --/--/B POS (12/12 0759) Antibody: NEG (12/12 0759) Rubella: 3.60 (10/23 1547) RPR: Non Reactive (10/23 1547)  HBsAg: Negative (10/23 1547)  HIV: Non Reactive (10/23 1547)  GBS:     Assessment/Plan: 31 year old female IUP at 18.5 weeks Preterm  Labor Of [place] Infant  -Dr. Toma Deiters consulted and informed of patient status, evaluation, interventions, and results. Advised: *Admit to Antenatal *Treat pain with Dilaudid '2mg'$  IV Q 2 hrs PRN. -Nurses updated on POC.    Tricia Campbell 07/19/2022, 4:54 AM

## 2022-07-19 NOTE — MAU Note (Signed)
.  Tricia Campbell is a 31 y.o. at 46w5dhere in MAU reporting: lower abdominal pain at 10/10 starting this AM that woke her up out of her sleep. Patient reports being seen in MAU two days ago. Reports vaginal bleeding unable to describe at this time.   Onset of complaint: 07/19/22  Pain score: 10/10 Vitals:   07/19/22 0357  BP: 124/67  Pulse: (!) 113  SpO2: 100%     FHT: NA  Lab orders placed from triage:

## 2022-07-19 NOTE — Progress Notes (Signed)
S/p SVD of twin B- placentas delivered at 57 with RN. I inspected the placenta and it is   Subjective: no complaints, up ad lib, voiding, and tolerating PO  Objective: Blood pressure (!) 106/59, pulse (!) 104, temperature 98 F (36.7 C), temperature source Oral, resp. rate 16, height '5\' 6"'$  (1.676 m), weight 64.3 kg, SpO2 100 %.  Physical Exam:  General: alert, cooperative, and no distress Lochia: appropriate Uterine Fundus: firm Incision: n/a DVT Evaluation: No evidence of DVT seen on physical exam.  Recent Labs    07/17/22 0801 07/18/22 0442  HGB 10.0* 10.0*  HCT 30.5* 29.1*    Assessment/Plan: S/p SAB of both twins - Reviewed most likely underlying cause was chorio but reviewed early follow up with CL in subsequent pregnancy per MFM plan. She is aware that with her history of FT singleton delivery she is at low risk for recurrence.  - Rh status: positive - Rubella status: immune - Dispo: Likely tomorrow    LOS: 0 days   Radene Gunning 07/19/2022, 3:11 PM

## 2022-07-20 LAB — CBC
HCT: 26.9 % — ABNORMAL LOW (ref 36.0–46.0)
Hemoglobin: 8.8 g/dL — ABNORMAL LOW (ref 12.0–15.0)
MCH: 28.7 pg (ref 26.0–34.0)
MCHC: 32.7 g/dL (ref 30.0–36.0)
MCV: 87.6 fL (ref 80.0–100.0)
Platelets: 255 10*3/uL (ref 150–400)
RBC: 3.07 MIL/uL — ABNORMAL LOW (ref 3.87–5.11)
RDW: 13.7 % (ref 11.5–15.5)
WBC: 10.1 10*3/uL (ref 4.0–10.5)
nRBC: 0 % (ref 0.0–0.2)

## 2022-07-20 MED ORDER — FERROUS SULFATE 325 (65 FE) MG PO TABS: 325.0000 mg | ORAL_TABLET | ORAL | 3 refills | Status: DC

## 2022-07-20 MED ORDER — OXYCODONE-ACETAMINOPHEN 5-325 MG PO TABS
1.0000 | ORAL_TABLET | Freq: Four times a day (QID) | ORAL | Status: DC | PRN
  Administered 2022-07-20 (×2): 2 via ORAL
  Filled 2022-07-20 (×2): qty 2

## 2022-07-20 MED ORDER — IBUPROFEN 800 MG PO TABS: 800.0000 mg | ORAL_TABLET | Freq: Three times a day (TID) | ORAL | 0 refills | Status: DC | PRN

## 2022-07-20 NOTE — Discharge Summary (Signed)
Discharge Summary    Patient Name: Tricia Campbell DOB: 12-13-1990 MRN: [id number]  Date of admission: 07/19/2022 Delivery date:07/19/2022  Delivering provider: Florian Buff  Date of discharge: 07/20/2022  Admitting diagnosis: SAB (spontaneous abortion) [O03.9] Intrauterine pregnancy: [redacted]w[redacted]d    Secondary diagnosis:  Principal Problem:   SAB (spontaneous abortion)  Additional problems: Acute blood loss anemia due to hemorrhage    Discharge diagnosis:  Previable PPROM of Twin A, Previable delivery of Twin B c/w SAB (<20 weeks)                                               Post partum procedures: None  Complications: [id number] Hospital course: Onset of Labor With Vaginal Delivery      31y.o. yo [id number] 166w6das admitted in Active Labor on 07/19/2022. Labor course was complicated by previable rupture of Twin A. She delivered Twin B upon arrival to MAU with symptoms of labor and bleeding.   Membrane Rupture Time/Date:  ,07/15/2022   Delivery Method:Vaginal, Spontaneous   [facility]. EBL was about 1600. Her hgb the following day was 8.8 and her vitals were stable. I sent her home on PO iron for her anemia due to acute blood loss at the time of the delivery with the placentas.   She is ambulating, tolerating a regular diet, passing flatus, and urinating well. Patient is discharged home in stable condition on 07/20/22.  Newborn Data: Birth date:07/19/2022  Birth time:7:00 AM   Rhophylac:N/A Transfusion:No  Physical exam  Vitals:   07/20/22 0007 07/20/22 0416 07/20/22 0735 07/20/22 1309  BP: 122/67 (!) 97/55 97/64 124/61  Pulse: 84 76 89 83  Resp: _0 Temp: 97.6 F (36.4 C) 98 F (36.7 C) 98.1 F (36.7 C) 98.3 F (36.8 C)  TempSrc: Oral Oral Oral Oral  SpO2:  96%  100%  Weight:      Height:       General: alert, cooperative, and no distress Lochia: appropriate Uterine  Fundus: firm Incision: N/A DVT Evaluation: No significant calf/ankle edema. Labs: Lab Results  Component Value Date   WBC 10.1 07/20/2022   HGB 8.8 (L) 07/20/2022   HCT 26.9 (L) 07/20/2022   MCV 87.6 07/20/2022   PLT 255 07/20/2022      Latest Ref Rng & Units 04/22/2022    8:57 AM  CMP  Glucose 70 - 99 mg/dL 113   BUN 6 - 20 mg/dL 7   Creatinine 0.44 - 1.00 mg/dL 0.70   Sodium 135 - 145 mmol/L 134   Potassium 3.5 - 5.1 mmol/L 3.9   Chloride 98 - 111 mmol/L 105   CO2 22 - 32 mmol/L 21   Calcium 8.9 - 10.3 mg/dL 9.3    Edinburgh Score:    02/27/2017   12:25 PM  Edinburgh Postnatal Depression Scale Screening Tool  I have been able to laugh and see the funny side of things. 0  I have looked forward with enjoyment to things. 0  I have blamed myself unnecessarily when things went wrong. 0  I have been anxious or worried for no good reason. 0  I have felt scared or panicky for no good reason. 1  Things have been getting on top of me. 1  I have been so  unhappy that I have had difficulty sleeping. 0  I have felt sad or miserable. 0  I have been so unhappy that I have been crying. 0  The thought of harming myself has occurred to me. 0  Edinburgh Postnatal Depression Scale Total 2     After visit meds:  Allergies as of 07/20/2022       Reactions   Latex Rash        Medication List     STOP taking these medications    aspirin EC 81 MG tablet   Blood Pressure Kit Devi   Magnesium 400 MG Caps   promethazine 12.5 MG tablet Commonly known as: PHENERGAN       TAKE these medications    acetaminophen 500 MG tablet Commonly known as: TYLENOL Take 1 tablet (500 mg total) by mouth every 6 (six) hours as needed for mild pain or moderate pain.   cyclobenzaprine 10 MG tablet Commonly known as: FLEXERIL Take 1 tablet (10 mg total) by mouth 3 (three) times daily as needed for muscle spasms.   ferrous sulfate 325 (65 FE) MG tablet Commonly known as: FerrouSul Take  1 tablet (325 mg total) by mouth every other day.   ibuprofen 800 MG tablet Commonly known as: ADVIL Take 1 tablet (800 mg total) by mouth 3 (three) times daily with meals as needed for headache, moderate pain or cramping.   oxyCODONE 5 MG immediate release tablet Commonly known as: Oxy IR/ROXICODONE Take 1 tablet (5 mg total) by mouth every 4 (four) hours as needed for breakthrough pain or severe pain.   prenatal multivitamin Tabs tablet Take 1 tablet by mouth daily at 12 noon.         Discharge home in stable condition  Infant [place] Discharge instruction: per After Visit Summary and Postpartum booklet. Activity: Advance as tolerated. Pelvic rest for 6 weeks.  Diet: routine diet Future Appointments: Future Appointments  Date Time Provider [place]  07/24/2022  9:15 AM Clarnce Flock, MD [facility]  08/13/2022 10:55 AM Manya Silvas, CNM [facility]   Follow up Visit:  [place] [facility] [place] at [facility] Follow up in 6 week(s).   Specialty: Obstetrics and Gynecology Contact information: [place] [phone]                 Message sent to [facility] for 4-6 week follow up with me ideally.  Amb referral submitted for [facility] for pregnancy loss.    07/20/2022 Radene Gunning, MD

## 2022-07-23 ENCOUNTER — Ambulatory Visit: Payer: Medicaid Other

## 2022-07-23 ENCOUNTER — Encounter: Payer: Self-pay | Admitting: *Deleted

## 2022-07-23 LAB — SURGICAL PATHOLOGY

## 2022-07-24 ENCOUNTER — Encounter: Payer: Medicaid Other | Admitting: Family Medicine

## 2022-07-31 ENCOUNTER — Telehealth (HOSPITAL_COMMUNITY): Payer: Self-pay | Admitting: *Deleted

## 2022-07-31 NOTE — Telephone Encounter (Signed)
Attempted Hospital Discharge Follow-Up Call.  Left voice mail requesting that patient return RN's phone call if patient has any concerns or questions regarding her healing process.

## 2022-08-01 NOTE — BH Specialist Note (Signed)
Integrated Behavioral Health via Telemedicine Visit  08/08/2022 Tricia Campbell [id number]  Number of Integrated Behavioral Health Clinician visits: 1- Initial Visit  Session Start time: 0922   Session End time: 5009  Total time in minutes: 24   Referring Provider: Radene Gunning, MD Patient/Family location: Home [facility] Provider location: Center for [place] [facility]  All persons participating in visit: Patient Tricia Campbell and [place]   Types of Service: Individual psychotherapy and Telephone visit  I connected with Tricia Campbell and/or Tricia Campbell's  n/a  via  Telephone or Video Enabled Telemedicine Application  (Video is Caregility application) and verified that I am speaking with the correct person using two identifiers. Discussed confidentiality: Yes   I discussed the limitations of telemedicine and the availability of in person appointments.  Discussed there is a possibility of technology failure and discussed alternative modes of communication if that failure occurs.  I discussed that engaging in this telemedicine visit, they consent to the provision of behavioral healthcare and the services will be billed under their insurance.  Patient and/or legal guardian expressed understanding and consented to Telemedicine visit: Yes   Presenting Concerns: Patient and/or family reports the following symptoms/concerns: Grieving loss of twin pregnancy with fatigue; eating and sleeping well, with good support at home.  Patient and/or Family's Strengths/Protective Factors: Social connections, Social and Emotional competence, Concrete supports in place (healthy food, safe environments, etc.), Sense of purpose, and Physical Health (exercise, healthy diet, medication compliance, etc.)  Goals Addressed: Patient will:   Increase knowledge and/or ability of: healthy habits   Demonstrate ability to:  continue  healthy grieving over losses  Progress towards Goals: Ongoing  Interventions: Interventions utilized:  Supportive Counseling, Functional Assessment of ADLs, Psychoeducation and/or Health Education, and Link to [employer] Standardized Assessments completed: ASRS, GAD-7, and PHQ 9  Patient and/or Family Response: Patient agrees with treatment plan.   Assessment: Patient currently experiencing Grief.   Patient may benefit from brief therapeutic interventions today.  Plan: Follow up with behavioral health clinician on : Call Schae Cando at [phone], as needed. Behavioral recommendations:  -Continue taking iron pills as prescribed until postpartum visit -Continue prioritizing healthy self-care (regular meals, adequate rest; allowing practical help from supportive friends and family) until at least postpartum medical appointment -Consider pregnancy loss support group as needed at either [email or web address] or [email or web address]   Referral(s): [facility] and [employer]:  pregnancy loss support group  I discussed the assessment and treatment plan with the patient and/or parent/guardian. They were provided an opportunity to ask questions and all were answered. They agreed with the plan and demonstrated an understanding of the instructions.   They were advised to call back or seek an in-person evaluation if the symptoms worsen or if the condition fails to improve as anticipated.  Garlan Fair, LCSW     08/08/2022    9:36 AM 05/28/2022    3:28 PM 07/27/2021   10:45 AM 06/15/2021    2:38 PM 12/12/2020   12:13 PM  Depression screen PHQ 2/9  Decreased Interest 0 2 1 0 0  Down, Depressed, Hopeless 0 0 0 0 0  PHQ - 2 Score 0 2 1 0 0  Altered sleeping 0 1 0 0 0  Tired, decreased energy '3 2 1 3 '$ 0  Change in appetite 0 '1 1 2 '$ 0  Feeling bad or failure about yourself  0 0 0 0 0  Trouble concentrating 1 0  0 0 0  Moving slowly or  fidgety/restless 0 0 1 0 0  Suicidal thoughts 0 0 0 0 0  PHQ-9 Score '4 6 4 5 '$ 0      08/08/2022    9:42 AM 05/28/2022    3:28 PM 07/27/2021   10:46 AM 06/15/2021    2:38 PM  GAD 7 : Generalized Anxiety Score  Nervous, Anxious, on Edge 0 1 0 0  Control/stop worrying 0 0 0 0  Worry too much - different things 0 0 0 0  Trouble relaxing 0 2 0 0  Restless 0 0  0  Easily annoyed or irritable 0 1  0  Afraid - awful might happen 1 0  0  Total GAD 7 Score 1 4  0

## 2022-08-08 ENCOUNTER — Ambulatory Visit (INDEPENDENT_AMBULATORY_CARE_PROVIDER_SITE_OTHER): Payer: Medicaid Other | Admitting: Clinical

## 2022-08-08 DIAGNOSIS — F4321 Adjustment disorder with depressed mood: Secondary | ICD-10-CM | POA: Diagnosis not present

## 2022-08-08 NOTE — Patient Instructions (Signed)
Center for [facility] at [facility] [place], [place] [address] [phone] (main office) [phone] ([place] office)  Pregnancy Loss Support Groups [email or web address]

## 2022-08-13 ENCOUNTER — Encounter: Payer: Medicaid Other | Admitting: Advanced Practice Midwife

## 2022-09-03 ENCOUNTER — Telehealth: Payer: Self-pay | Admitting: Clinical

## 2022-09-03 NOTE — Telephone Encounter (Signed)
Brief follow up, as agreed upon; pt doing well at this time and will call back at [phone] as needed.

## 2022-09-06 ENCOUNTER — Encounter: Payer: Self-pay | Admitting: Family Medicine

## 2022-09-06 ENCOUNTER — Ambulatory Visit (INDEPENDENT_AMBULATORY_CARE_PROVIDER_SITE_OTHER): Payer: Medicaid Other | Admitting: Family Medicine

## 2022-09-06 VITALS — BP 109/71 | HR 92 | Wt 143.6 lb

## 2022-09-06 DIAGNOSIS — G8929 Other chronic pain: Secondary | ICD-10-CM

## 2022-09-06 DIAGNOSIS — F4321 Adjustment disorder with depressed mood: Secondary | ICD-10-CM | POA: Diagnosis not present

## 2022-09-06 DIAGNOSIS — M545 Low back pain, unspecified: Secondary | ICD-10-CM | POA: Diagnosis not present

## 2022-09-06 DIAGNOSIS — Z3042 Encounter for surveillance of injectable contraceptive: Secondary | ICD-10-CM

## 2022-09-06 DIAGNOSIS — Z8759 Personal history of other complications of pregnancy, childbirth and the puerperium: Secondary | ICD-10-CM

## 2022-09-06 LAB — POCT PREGNANCY, URINE: Preg Test, Ur: NEGATIVE

## 2022-09-06 MED ORDER — MEDROXYPROGESTERONE ACETATE 150 MG/ML IM SUSP
150.0000 mg | Freq: Once | INTRAMUSCULAR | Status: AC
  Administered 2022-09-06: 150 mg via INTRAMUSCULAR

## 2022-09-06 MED ORDER — HYDROXYZINE HCL 10 MG PO TABS: 10.0000 mg | ORAL_TABLET | Freq: Three times a day (TID) | ORAL | 1 refills | Status: DC | PRN

## 2022-09-06 MED ORDER — SERTRALINE HCL 50 MG PO TABS: 50.0000 mg | ORAL_TABLET | Freq: Every day | ORAL | 1 refills | Status: DC

## 2022-09-06 NOTE — Progress Notes (Signed)
[facility] Partum Visit Note  Shala Baumbach is a 32 y.o. G67P1021 female who presents for a postpartum visit. She is 6 week postpartum following a normal spontaneous vaginal delivery of non-viable twins.  I have fully reviewed the prenatal and intrapartum course. The delivery was at 18 gestational weeks.  Bleeding no bleeding. Bowel function is normal. Bladder function is normal. Patient is sexually active. Contraception method is Depo-Provera injections. Postpartum depression screening: negative.   [facility] - 09/06/22 1002       Pregnancy Intention Screening   Does the patient want to become pregnant in the next year? Yes    Does the patient's partner want to become pregnant in the next year? Yes    Would the patient like to discuss contraceptive options today? Yes      Contraception Wrap Up   Current Method No Contraceptive Precautions            The pregnancy intention screening data noted above was reviewed. Potential methods of contraception were discussed. The patient elected to proceed with No data recorded.   Edinburgh Postnatal Depression Scale - 09/06/22 1000       Edinburgh Postnatal Depression Scale:  In the Past 7 Days   I have been able to laugh and see the funny side of things. 0    I have looked forward with enjoyment to things. 0    I have blamed myself unnecessarily when things went wrong. 2    I have been anxious or worried for no good reason. 3    I have felt scared or panicky for no good reason. 0    Things have been getting on top of me. 1    I have been so unhappy that I have had difficulty sleeping. 0    I have felt sad or miserable. 0    I have been so unhappy that I have been crying. 0    The thought of harming myself has occurred to me. 0    Edinburgh Postnatal Depression Scale Total 6             There are no preventive care reminders to display for this patient.  The following portions of the patient's history were reviewed and  updated as appropriate: allergies, current medications, past family history, past medical history, past social history, past surgical history, and problem list.  Review of Systems Pertinent items noted in HPI and remainder of comprehensive ROS otherwise negative.  Objective:  BP 109/71   Pulse 92   Wt 143 lb 9.6 oz (65.1 kg)   LMP  (LMP Unknown)   Breastfeeding Unknown   BMI 23.18 kg/m    General:  alert, cooperative, and appears stated age  Lungs: Normal effort  Heart:  regular rate and rhythm  Abdomen: soft, non-tender; bowel sounds normal; no masses,  no organomegaly        Assessment:  Postpartum exam.  Grief reaction  Plan:   Essential components of care per ACOG recommendations:  1.  Mood and well being: Patient with equivocal depression screening today. Reviewed local resources for support. Quite tearful in office. Having what sounds like anxiety attacks Trial of Zoloft with Vistaril for acute anxiety attacks. - Patient tobacco use? No.   - hx of drug use? Yes. Discussed support systems and outpatient/inpatient treatment options.     2. Sexuality, contraception and birth spacing - Patient does want a pregnancy in the next year.  - Reviewed reproductive life planning. Reviewed  contraceptive methods based on pt preferences and effectiveness.  Patient desired Hormonal Injection today.   - Discussed birth spacing of 18 months   3. Physical Recovery  - Patient is safe to resume physical and sexual activity  4.  Health Maintenance - HM due items addressed Yes - Last pap smear  Diagnosis  Date Value Ref Range Status  03/01/2021   Final   - Negative for intraepithelial lesion or malignancy (NILM)   Pap smear not done at today's visit.  -Breast Cancer screening indicated? No.   5. Chronic Disease/Pregnancy Condition follow up: None  - PCP follow up  Donnamae Jude, [place] [facility] [place], [place]

## 2022-09-10 NOTE — BH Specialist Note (Signed)
Error; pt rescheduled

## 2022-09-13 ENCOUNTER — Ambulatory Visit: Payer: Medicaid Other | Attending: Family Medicine | Admitting: Physical Therapy

## 2022-09-13 ENCOUNTER — Encounter: Payer: Self-pay | Admitting: Physical Therapy

## 2022-09-13 ENCOUNTER — Other Ambulatory Visit: Payer: Self-pay

## 2022-09-13 DIAGNOSIS — M545 Low back pain, unspecified: Secondary | ICD-10-CM | POA: Insufficient documentation

## 2022-09-13 DIAGNOSIS — R252 Cramp and spasm: Secondary | ICD-10-CM | POA: Insufficient documentation

## 2022-09-13 DIAGNOSIS — M5459 Other low back pain: Secondary | ICD-10-CM | POA: Insufficient documentation

## 2022-09-13 DIAGNOSIS — M546 Pain in thoracic spine: Secondary | ICD-10-CM | POA: Diagnosis present

## 2022-09-13 DIAGNOSIS — G8929 Other chronic pain: Secondary | ICD-10-CM | POA: Insufficient documentation

## 2022-09-13 NOTE — Therapy (Signed)
OUTPATIENT PHYSICAL THERAPY THORACOLUMBAR EVALUATION   Patient Name: Tricia Campbell MRN: [id number] DOB:1991/06/26, 32 y.o., female Today's Date: 09/13/2022  END OF SESSION:  PT End of Session - 09/13/22 1334     Visit Number 1    Number of Visits 27    Date for PT Re-Evaluation 11/09/22    Authorization Type [employer] MEDICAID, no auth needed over 21yo    PT Start Time 1100    PT Stop Time 1145    PT Time Calculation (min) 45 min    Activity Tolerance Patient tolerated treatment well    Behavior During Therapy WFL for tasks assessed/performed             Past Medical History:  Diagnosis Date   History of marijuana use 08/17/2016   +UDS at first prenatal visit   Vaginal delivery    Past Surgical History:  Procedure Laterality Date   APPENDECTOMY     LAPAROSCOPIC APPENDECTOMY N/A 07/23/2018   Procedure: APPENDECTOMY LAPAROSCOPIC;  Surgeon: Alphonsa Overall, MD;  Location: [facility];  Service: General;  Laterality: N/A;   WISDOM TOOTH EXTRACTION     Patient Active Problem List   Diagnosis Date Noted   History of pregnancy loss, not currently pregnant 09/06/2022    PCP: no PCP  REFERRING PROVIDER: Donnamae Jude, MD  REFERRING DIAG: M54.50,G89.29 (ICD-10-CM) - Chronic bilateral low back pain without sciatica  Rationale for Evaluation and Treatment: Rehabilitation  THERAPY DIAG:  Other low back pain  Pain in thoracic spine  Cramp and spasm  ONSET DATE: since vaginal delivery 5 years ago  SUBJECTIVE:                                                                                                                                                                                           SUBJECTIVE STATEMENT: Pt has had chronic back pain since first natural delivery 5 years ago (has 36yo son).  Pt is now approx 8 weeks s/p spontaneous abortion of twins on 07/19/22.  She was 46w6dalong at the time of loss.  LBP flared up during that pregnancy and has continued.   I  always feel like I need my back cracked. I think I need a chiropractor.  I get short term relief if I can get it to crack.  I also get muscle spasms with sharp pain - "like contractions in my back".   I work out with a pPhysiological scientistand he says I don't have enough movement in back.  I work out with him at random.  No specific schedule.  I use friends that are trainers.  PERTINENT HISTORY:  8 weeks s/p SAB on 07/19/22, was 64w6dalong at the time of loss anxiety  PAIN:  PAIN:  Are you having pain? Yes NPRS scale: 0-10/10 Pain location: whole back Pain orientation: Right, Left, Upper, and Lower  PAIN TYPE: aching and sharp tight Pain description: constant  Aggravating factors: sleeping, pain is worse when I wake up, side sleeper Relieving factors: cracking my back, getting massages   PRECAUTIONS: None  WEIGHT BEARING RESTRICTIONS: No  FALLS:  Has patient fallen in last 6 months? No  LIVING ENVIRONMENT: Lives with: lives with their son (554yo Lives in: House/apartment Stairs: Yes: External: 40 steps; can reach both Has following equipment at home: None  OCCUPATION: unemployed  PLOF: Independent  PATIENT GOALS: relax and loosen I my back, I want to feel less tense  NEXT MD VISIT: unsure  OBJECTIVE:   DIAGNOSTIC FINDINGS:  N/a  PATIENT SURVEYS:  Modified Oswestry 16/50 = moderate disability   SCREENING FOR RED FLAGS: Bowel or bladder incontinence: No Spinal tumors: No Cauda equina syndrome: No Compression fracture: No Abdominal aneurysm: No  COGNITION: Overall cognitive status: Within functional limits for tasks assessed     SENSATION: WFL  MUSCLE LENGTH: Hamstrings: end range tightness bil   POSTURE: increased lumbar lordosis and decreased thoracic kyphosis, Lt shoulder elevated  PALPATION: Tender Rt>Lt SI joints Spasm and tender along bil paraspinals cervical to pelvis, greater surrounding T/L junction  LUMBAR ROM:   AROM eval  Flexion Fingers  to ankles, HS and thoracic stretch  Extension Full with thoracic pain Signif extension hinge at T/L jxn  Right lateral flexion WNL contralateral stretch  Left lateral flexion WNL contralateral stretch  Right rotation 50%  Left rotation 75%   (Blank rows = not tested)  LOWER EXTREMITY ROM:     WNL bil  LOWER EXTREMITY MMT:    MMT Right eval Left eval  Hip flexion 4-   Hip extension 4- 4+  Hip abduction 4- 4+  Hip adduction 5 5  Hip internal rotation 4+ 4+  Hip external rotation 4+ 4+  Knee flexion 4+ 4+  Knee extension 4 4+  Ankle dorsiflexion    Ankle plantarflexion    Ankle inversion    Ankle eversion     (Blank rows = not tested)  LUMBAR SPECIAL TESTS:  Single leg stance test: Positive  FUNCTIONAL TESTS:  Rt SI joint unlocks in SLS and with squat  GAIT: Distance walked: within clinic Assistive device utilized: None Level of assistance: Complete Independence Comments: no major gait deviations  TODAY'S TREATMENT:                                                                                                                              DATE: 09/13/22    PATIENT EDUCATION:  Education details: Access Code: [email or web address] Person educated: Patient Education method: EConsulting civil engineer Demonstration, Verbal cues, and Handouts Education comprehension: verbalized understanding and returned demonstration  HOME EXERCISE PROGRAM: Access Code: [email or web address] URL:  [email or web address] Date: 09/13/2022 Prepared by: Venetia Night Sabree Nuon  Exercises - Seated Hamstring Stretch  - 1 x daily - 7 x weekly - 1 sets - 3 reps - 30 sec hold - Cat to Child's Pose with Posterior Pelvic Tilt  - 1 x daily - 7 x weekly - 1 sets - 10 reps - 10 sec hold - Seated Lumbar Flexion Stretch  - 1 x daily - 7 x weekly - 1 sets - 5 reps - 10 sec hold  ASSESSMENT:  CLINICAL IMPRESSION: Patient is a 32 y.o. female who was seen today for physical therapy evaluation and treatment for chronic LBP.  Pt  describes "whole back pain" with stiffness and muscle cramps which are relieved with cracking back and massage.  Pain has been present since childbirth 5 years ago and flared up with recent pregnancy/pregnancy loss of twins at 64w6din mid-Dec 2023.   Pt presents with excessive extension hinge at T/L junction where most movement comes from with extension, rotation and SB of trunk.  She has significant muscle guarding along bil paraspinals from cervical spine to pelvis, most notable around T/L junction, likely trying to protect motion hinge.  She also has unlocking Rt SI joint with squat and SLS, demonstrating weakness surrounding Rt hemipelvis.  Her abdominal midline does not demonstrate any diastasis.  ODI score is 16/50, scoring in the moderate disability category.  PT educated Pt on avoiding cracking back as weakest part of spine will be what gives.  Initiated spine flexion ROM stretching today.  Pt will benefit from skilled PT to address findings and reduce pain.    OBJECTIVE IMPAIRMENTS: decreased activity tolerance, decreased coordination, decreased mobility, decreased ROM, decreased strength, hypomobility, impaired flexibility, improper body mechanics, postural dysfunction, and pain.   ACTIVITY LIMITATIONS: carrying, lifting, bending, sitting, standing, squatting, sleeping, stairs, transfers, and locomotion level  PARTICIPATION LIMITATIONS: meal prep, cleaning, laundry, shopping, and community activity  PERSONAL FACTORS: Past/current experiences are also affecting patient's functional outcome.   REHAB POTENTIAL: Excellent  CLINICAL DECISION MAKING: Stable/uncomplicated  EVALUATION COMPLEXITY: Low   GOALS: Goals reviewed with patient? Yes  SHORT TERM GOALS: Target date: 10/11/22  Pt will be ind with initial HEP Baseline: Goal status: INITIAL  2.  Pt will report compliance with significant reduction in attempts to crack spine throughout the day with understand of why  Baseline:   Goal status: INITIAL  3.  Pt will report pain not to exceed 8/10 with daily activities Baseline: 10/10 Goal status: INITIAL  4.  Pt will return demo of proper squat with hip hinge using and proper stabilization of T/L junction Baseline:  Goal status: INITIAL    LONG TERM GOALS: Target date: 11/08/22  Pt will be ind with advanced HEP with proper stabilization strategies and body mechanics Baseline:  Goal status: INITIAL  2.  Pt will report improved sleep by at least 70% Baseline:  Goal status: INITIAL  3.  Pt will score 10/50 or better on ODI to demo reduced disability from moderate to mild. Baseline: 16/50 Goal status: INITIAL  4.  Pt will report pain control within range of 0-6/10 with daily tasks Baseline: 0-10/10 Goal status: INITIAL  5.  Pt will improve LE strength to at least 4+/5 for improved stability of Rt hemipelvis for stairs, squats, SLS phase of functional movement. Baseline:  Goal status: INITIAL    PLAN:  PT FREQUENCY: 1-2x/week  PT DURATION: 8 weeks  PLANNED INTERVENTIONS: Therapeutic exercises, Therapeutic activity, Neuromuscular re-education, Patient/Family education, Self Care, Joint  mobilization, Aquatic Therapy, Dry Needling, Electrical stimulation, Spinal mobilization, Cryotherapy, Moist heat, Taping, Traction, Ionotophoresis '4mg'$ /ml Dexamethasone, and Manual therapy.  PLAN FOR NEXT SESSION: NuStep, review HEP, progress LE stretches, intro core and body mechanics for hip hinge, DN thoracic and lumbar paraspinals, monitor excessive hinge point at T/L junction and Rt SI joint unlocking with stabilization training  Zevin Nevares, PT 09/13/22 1:41 PM

## 2022-09-18 ENCOUNTER — Ambulatory Visit: Payer: Medicaid Other

## 2022-09-24 ENCOUNTER — Ambulatory Visit: Payer: Medicaid Other | Admitting: Clinical

## 2022-09-26 ENCOUNTER — Ambulatory Visit: Payer: Medicaid Other | Admitting: Physical Therapy

## 2022-09-26 ENCOUNTER — Encounter: Payer: Self-pay | Admitting: Physical Therapy

## 2022-09-26 DIAGNOSIS — M546 Pain in thoracic spine: Secondary | ICD-10-CM

## 2022-09-26 DIAGNOSIS — M5459 Other low back pain: Secondary | ICD-10-CM | POA: Diagnosis not present

## 2022-09-26 DIAGNOSIS — R252 Cramp and spasm: Secondary | ICD-10-CM

## 2022-09-26 NOTE — Therapy (Signed)
OUTPATIENT PHYSICAL THERAPY TREATMENT NOTE   Patient Name: Tricia Campbell MRN: [id number] DOB:October 29, 1990, 32 y.o., female Today's Date: 09/26/2022  PCP: [facility] REFERRING PROVIDER: Darron Doom, MD  END OF SESSION:   PT End of Session - 09/26/22 1018     Visit Number 2    Number of Visits 27    Date for PT Re-Evaluation 11/09/22    Authorization Type [employer] MEDICAID, no auth needed over 21yo    PT Start Time 1016    PT Stop Time 1058    PT Time Calculation (min) 42 min    Activity Tolerance Patient tolerated treatment well;No increased pain    Behavior During Therapy [facility] for tasks assessed/performed              Past Medical History:  Diagnosis Date   History of marijuana use 08/17/2016   +UDS at first prenatal visit   Vaginal delivery    Past Surgical History:  Procedure Laterality Date   APPENDECTOMY     LAPAROSCOPIC APPENDECTOMY N/A 07/23/2018   Procedure: APPENDECTOMY LAPAROSCOPIC;  Surgeon: Alphonsa Overall, MD;  Location: [facility];  Service: General;  Laterality: N/A;   WISDOM TOOTH EXTRACTION     Patient Active Problem List   Diagnosis Date Noted   History of pregnancy loss, not currently pregnant 09/06/2022    PCP: no PCP  REFERRING PROVIDER: Donnamae Jude, MD  REFERRING DIAG: M54.50,G89.29 (ICD-10-CM) - Chronic bilateral low back pain without sciatica  Rationale for Evaluation and Treatment: Rehabilitation  THERAPY DIAG:  Other low back pain  Pain in thoracic spine  Cramp and spasm  ONSET DATE: since vaginal delivery 5 years ago  SUBJECTIVE:                                                                                                                                                                                           SUBJECTIVE STATEMENT: Pt states that things are going well. She is not doing her HEP as much as she should.   Pt has had chronic back pain since first natural delivery 5 years ago (has 30yo  son).  Pt is now approx 8 weeks s/p spontaneous abortion of twins on 07/19/22.  She was 34w6dalong at the time of loss.  LBP flared up during that pregnancy and has continued.   I always feel like I need my back cracked. I think I need a chiropractor.  I get short term relief if I can get it to crack.  I also get muscle spasms with sharp pain - "like contractions in my back".   I work out with  a Physiological scientist and he says I don't have enough movement in back.  I work out with him at random.  No specific schedule.  I use friends that are trainers.  PERTINENT HISTORY:  8 weeks s/p SAB on 07/19/22, was 41w6dalong at the time of loss anxiety  PAIN:  PAIN:  Are you having pain? Yes NPRS scale: 0-10/10 Pain location: whole back Pain orientation: Right, Left, Upper, and Lower  PAIN TYPE: aching and sharp tight Pain description: constant  Aggravating factors: sleeping, pain is worse when I wake up, side sleeper Relieving factors: cracking my back, getting massages   PRECAUTIONS: None  WEIGHT BEARING RESTRICTIONS: No  FALLS:  Has patient fallen in last 6 months? No  LIVING ENVIRONMENT: Lives with: lives with their son (548yo Lives in: House/apartment Stairs: Yes: External: 40 steps; can reach both Has following equipment at home: None  OCCUPATION: unemployed  PLOF: Independent  PATIENT GOALS: relax and loosen I my back, I want to feel less tense  NEXT MD VISIT: unsure  OBJECTIVE:   DIAGNOSTIC FINDINGS:  N/a  PATIENT SURVEYS:  Modified Oswestry 16/50 = moderate disability   SCREENING FOR RED FLAGS: Bowel or bladder incontinence: No Spinal tumors: No Cauda equina syndrome: No Compression fracture: No Abdominal aneurysm: No  COGNITION: Overall cognitive status: Within functional limits for tasks assessed     SENSATION: WFL  MUSCLE LENGTH: Hamstrings: end range tightness bil   POSTURE: increased lumbar lordosis and decreased thoracic kyphosis, Lt shoulder  elevated  PALPATION: Tender Rt>Lt SI joints Spasm and tender along bil paraspinals cervical to pelvis, greater surrounding T/L junction  LUMBAR ROM:   AROM eval  Flexion Fingers to ankles, HS and thoracic stretch  Extension Full with thoracic pain Signif extension hinge at T/L jxn  Right lateral flexion WNL contralateral stretch  Left lateral flexion WNL contralateral stretch  Right rotation 50%  Left rotation 75%   (Blank rows = not tested)  LOWER EXTREMITY ROM:     WNL bil  LOWER EXTREMITY MMT:    MMT Right eval Left eval  Hip flexion 4-   Hip extension 4- 4+  Hip abduction 4- 4+  Hip adduction 5 5  Hip internal rotation 4+ 4+  Hip external rotation 4+ 4+  Knee flexion 4+ 4+  Knee extension 4 4+  Ankle dorsiflexion    Ankle plantarflexion    Ankle inversion    Ankle eversion     (Blank rows = not tested)  LUMBAR SPECIAL TESTS:  Single leg stance test: Positive  FUNCTIONAL TESTS:  Rt SI joint unlocks in SLS and with squat  GAIT: Distance walked: within clinic Assistive device utilized: None Level of assistance: Complete Independence Comments: no major gait deviations  TODAY'S TREATMENT:                                                                                                                              DATE:  09/26/22 Cat/cow x10 reps  Supine abdominal bracing x10 reps Supine abdominal bracing with alternating LE march x10 reps  BUE pressdown blue TB 2x10 reps  Childs pose over pillow with deep breathing focus for thoracic expansion x10 Manual: addaday to lumbar/thoracic paraspinals, upper traps  09/13/22    PATIENT EDUCATION:  Education details: Access Code: [email or web address] Person educated: Patient Education method: Consulting civil engineer, Demonstration, Verbal cues, and Handouts Education comprehension: verbalized understanding and returned demonstration  HOME EXERCISE PROGRAM: Access Code: [email or web address] URL: [email or web address] Date:  09/13/2022 Prepared by: Venetia Night Beuhring  Exercises - Seated Hamstring Stretch  - 1 x daily - 7 x weekly - 1 sets - 3 reps - 30 sec hold - Cat to Child's Pose with Posterior Pelvic Tilt  - 1 x daily - 7 x weekly - 1 sets - 10 reps - 10 sec hold - Seated Lumbar Flexion Stretch  - 1 x daily - 7 x weekly - 1 sets - 5 reps - 10 sec hold  ASSESSMENT:  CLINICAL IMPRESSION: Pt continues to note intermittent back discomfort throughout the day. She has not been adhering to her HEP as much as she would like. Session focused on introducing deep abdominal activation. Pt had difficulty with this and demonstrated rib flaring. Pt had rib flaring and excessive lumbar lordosis compensations with most of today's exercises, but was able to correct with moderate feedback from PT. Ended with Addaday and focused breathing/rib expansion to address muscle tension in the lumbar and thoracic spine.     OBJECTIVE IMPAIRMENTS: decreased activity tolerance, decreased coordination, decreased mobility, decreased ROM, decreased strength, hypomobility, impaired flexibility, improper body mechanics, postural dysfunction, and pain.   ACTIVITY LIMITATIONS: carrying, lifting, bending, sitting, standing, squatting, sleeping, stairs, transfers, and locomotion level  PARTICIPATION LIMITATIONS: meal prep, cleaning, laundry, shopping, and community activity  PERSONAL FACTORS: Past/current experiences are also affecting patient's functional outcome.   REHAB POTENTIAL: Excellent  CLINICAL DECISION MAKING: Stable/uncomplicated  EVALUATION COMPLEXITY: Low   GOALS: Goals reviewed with patient? Yes  SHORT TERM GOALS: Target date: 10/11/22  Pt will be ind with initial HEP Baseline: Goal status: INITIAL  2.  Pt will report compliance with significant reduction in attempts to crack spine throughout the day with understand of why  Baseline:  Goal status: INITIAL  3.  Pt will report pain not to exceed 8/10 with daily  activities Baseline: 10/10 Goal status: INITIAL  4.  Pt will return demo of proper squat with hip hinge using and proper stabilization of T/L junction Baseline:  Goal status: INITIAL    LONG TERM GOALS: Target date: 11/08/22  Pt will be ind with advanced HEP with proper stabilization strategies and body mechanics Baseline:  Goal status: INITIAL  2.  Pt will report improved sleep by at least 70% Baseline:  Goal status: INITIAL  3.  Pt will score 10/50 or better on ODI to demo reduced disability from moderate to mild. Baseline: 16/50 Goal status: INITIAL  4.  Pt will report pain control within range of 0-6/10 with daily tasks Baseline: 0-10/10 Goal status: INITIAL  5.  Pt will improve LE strength to at least 4+/5 for improved stability of Rt hemipelvis for stairs, squats, SLS phase of functional movement. Baseline:  Goal status: INITIAL    PLAN:  PT FREQUENCY: 1-2x/week  PT DURATION: 8 weeks  PLANNED INTERVENTIONS: Therapeutic exercises, Therapeutic activity, Neuromuscular re-education, Patient/Family education, Self Care, Joint mobilization, Aquatic Therapy, Dry Needling, Electrical stimulation, Spinal mobilization, Cryotherapy, Moist heat, Taping, Traction, Ionotophoresis 66m/ml Dexamethasone,  and Manual therapy.  PLAN FOR NEXT SESSION: NuStep, encourage HEP adherence, progress LE stretches, progress core and body mechanics   11:30 AM,09/26/22 Sherol Dade PT, DPT [facility] [place]

## 2022-09-26 NOTE — BH Specialist Note (Signed)
Integrated Behavioral Health via Telemedicine Visit  10/04/2022 [name] [id number]  Number of Integrated Behavioral Health Clinician visits: 2- Second Visit  Session Start time: [id number]   Session End time: [id number]  Total time in minutes: 16   Referring Provider: Radene Gunning, MD [name]   Types of Service: Individual psychotherapy and Video visit  I connected with Tricia Campbell and/or Tricia Campbell's  n/a  via  Telephone or Video Enabled Telemedicine Application  (Video is Caregility application) and verified that I am speaking with the correct person using two identifiers. Discussed confidentiality: Yes   I discussed the limitations of telemedicine and the availability of in person appointments.  Discussed there is a possibility of technology failure and discussed alternative modes of communication if that failure occurs.  I discussed that engaging in this telemedicine visit, they consent to the provision of behavioral healthcare and the services will be billed under their insurance.  Patient and/or legal guardian expressed understanding and consented to Telemedicine visit: Yes   Presenting Concerns: Patient and/or family reports the following symptoms/concerns: Current headache; pt uncertainty regarding taking medication for anxiety, prefers using self-coping strategies (open to implement today); looking forward to upcoming birthday celebration with husband to help move forward in the midst of grief.   Patient and/or Family's Strengths/Protective Factors: Social connections, Social and Patent attorney, [place] supports in place (healthy food, safe environments, etc.), Sense of purpose, and Physical Health (exercise, healthy diet, medication  compliance, etc.)  Goals Addressed: Patient will:  Reduce symptoms of: anxiety   Increase knowledge and/or ability of: self-management skills   Demonstrate ability to:  Continue healthy grieving over losses  Progress towards Goals: Ongoing  Interventions: Interventions utilized:  Mindfulness or Relaxation Training Standardized Assessments completed: Not Needed  Patient and/or Family Response: Patient agrees with treatment plan.   Assessment: Patient currently experiencing Grief.   Patient may benefit from continued therapeutic interventions today.  Plan: Follow up with behavioral health clinician on : Call Leiam Hopwood at [phone], as needed. Behavioral recommendations:  -Continue prioritizing healthy self-care (regular meals, adequate rest; allowing practical help from supportive friends and family) for as long as needed -Continue plan to celebrate birthday with husband -CALM relaxation breathing exercise twice daily (morning; at bedtime); as needed throughout the day.   Referral(s): [facility] (In Clinic)  I discussed the assessment and treatment plan with the patient and/or parent/guardian. They were provided an opportunity to ask questions and all were answered. They agreed with the plan and demonstrated an understanding of the instructions.   They were advised to call back or seek an in-person evaluation if the symptoms worsen or if the condition fails to improve as anticipated.  Garlan Fair, LCSW     08/08/2022    9:36 AM 05/28/2022    3:28 PM 07/27/2021   10:45 AM 06/15/2021    2:38 PM 12/12/2020   12:13 PM  Depression screen PHQ 2/9  Decreased Interest 0 2 1 0 0  Down, Depressed, Hopeless 0 0 0 0 0  PHQ - 2 Score 0 2 1 0 0  Altered sleeping 0 1 0 0 0  Tired, decreased energy '3 2 1 3 '$ 0  Change in appetite 0 '1 1 2 '$ 0  Feeling bad or failure about yourself  0 0 0 0 0  Trouble concentrating 1  0 0 0 0  Moving slowly or fidgety/restless  0 0 1 0 0  Suicidal thoughts 0 0 0 0 0  PHQ-9 Score '4 6 4 5 '$ 0      08/08/2022    9:42 AM 05/28/2022    3:28 PM 07/27/2021   10:46 AM 06/15/2021    2:38 PM  GAD 7 : Generalized Anxiety Score  Nervous, Anxious, on Edge 0 1 0 0  Control/stop worrying 0 0 0 0  Worry too much - different things 0 0 0 0  Trouble relaxing 0 2 0 0  Restless 0 0  0  Easily annoyed or irritable 0 1  0  Afraid - awful might happen 1 0  0  Total GAD 7 Score 1 4  0

## 2022-09-28 ENCOUNTER — Encounter: Payer: Self-pay | Admitting: Rehabilitative and Restorative Service Providers"

## 2022-09-28 ENCOUNTER — Ambulatory Visit: Payer: Medicaid Other | Admitting: Rehabilitative and Restorative Service Providers"

## 2022-09-28 DIAGNOSIS — R252 Cramp and spasm: Secondary | ICD-10-CM

## 2022-09-28 DIAGNOSIS — M5459 Other low back pain: Secondary | ICD-10-CM

## 2022-09-28 DIAGNOSIS — M546 Pain in thoracic spine: Secondary | ICD-10-CM

## 2022-09-28 NOTE — Therapy (Signed)
OUTPATIENT PHYSICAL THERAPY TREATMENT NOTE   Patient Name: Tricia Campbell MRN: [id number] DOB:June 25, 1991, 32 y.o., female Today's Date: 09/28/2022  PCP: [facility] REFERRING PROVIDER: Darron Doom, MD  END OF SESSION:   PT End of Session - 09/28/22 1119     Visit Number 3    Date for PT Re-Evaluation 11/09/22    Authorization Type [employer] MEDICAID, no auth needed over 21yo    PT Start Time 1102    PT Stop Time 1140    PT Time Calculation (min) 38 min    Activity Tolerance Patient tolerated treatment well    Behavior During Therapy WFL for tasks assessed/performed              Past Medical History:  Diagnosis Date   History of marijuana use 08/17/2016   +UDS at first prenatal visit   Vaginal delivery    Past Surgical History:  Procedure Laterality Date   APPENDECTOMY     LAPAROSCOPIC APPENDECTOMY N/A 07/23/2018   Procedure: APPENDECTOMY LAPAROSCOPIC;  Surgeon: Alphonsa Overall, MD;  Location: [facility];  Service: General;  Laterality: N/A;   WISDOM TOOTH EXTRACTION     Patient Active Problem List   Diagnosis Date Noted   History of pregnancy loss, not currently pregnant 09/06/2022    PCP: no PCP  REFERRING PROVIDER: Donnamae Jude, MD  REFERRING DIAG: M54.50,G89.29 (ICD-10-CM) - Chronic bilateral low back pain without sciatica  Rationale for Evaluation and Treatment: Rehabilitation  THERAPY DIAG:  Other low back pain  Pain in thoracic spine  Cramp and spasm  ONSET DATE: since vaginal delivery 5 years ago  SUBJECTIVE:                                                                                                                                                                                           SUBJECTIVE STATEMENT: Pt reports that she has been trying to do her HEP more regularly, states that they help some.  PERTINENT HISTORY:  8 weeks s/p SAB on 07/19/22, was 7w6dalong at the time of loss anxiety  PAIN:  PAIN:   Are you having pain? Yes NPRS scale: 5/10 Pain location: whole back Pain orientation: Right, Left, Upper, and Lower  PAIN TYPE: aching and sharp tight Pain description: constant  Aggravating factors: sleeping, pain is worse when I wake up, side sleeper Relieving factors: cracking my back, getting massages   PRECAUTIONS: None  WEIGHT BEARING RESTRICTIONS: No  FALLS:  Has patient fallen in last 6 months? No  LIVING ENVIRONMENT: Lives with: lives with their son (567yo Lives in: House/apartment Stairs: Yes: External: 40 steps; can  reach both Has following equipment at home: None  OCCUPATION: unemployed  PLOF: Independent  PATIENT GOALS: relax and loosen I my back, I want to feel less tense  NEXT MD VISIT: unsure  OBJECTIVE:   DIAGNOSTIC FINDINGS:  N/a  PATIENT SURVEYS:  Modified Oswestry 16/50 = moderate disability   SENSATION: WFL  MUSCLE LENGTH: Hamstrings: end range tightness bil   POSTURE: increased lumbar lordosis and decreased thoracic kyphosis, Lt shoulder elevated  PALPATION: Tender Rt>Lt SI joints Spasm and tender along bil paraspinals cervical to pelvis, greater surrounding T/L junction  LUMBAR ROM:   AROM eval  Flexion Fingers to ankles, HS and thoracic stretch  Extension Full with thoracic pain Signif extension hinge at T/L jxn  Right lateral flexion WNL contralateral stretch  Left lateral flexion WNL contralateral stretch  Right rotation 50%  Left rotation 75%   (Blank rows = not tested)  LOWER EXTREMITY ROM:     WNL bil  LOWER EXTREMITY MMT:    MMT Right eval Left eval  Hip flexion 4-   Hip extension 4- 4+  Hip abduction 4- 4+  Hip adduction 5 5  Hip internal rotation 4+ 4+  Hip external rotation 4+ 4+  Knee flexion 4+ 4+  Knee extension 4 4+  Ankle dorsiflexion    Ankle plantarflexion    Ankle inversion    Ankle eversion     (Blank rows = not tested)  LUMBAR SPECIAL TESTS:  Single leg stance test:  Positive  FUNCTIONAL TESTS:  Rt SI joint unlocks in SLS and with squat  GAIT: Distance walked: within clinic Assistive device utilized: None Level of assistance: Complete Independence Comments: no major gait deviations  TODAY'S TREATMENT:                                                                                                                               DATE: 09/28/2022 Nustep level 3 x5 min with PT present to discuss status Sidebending over peanut ball x10 bilat Seated green pball rollout 5x10 sec Hooklying posterior pelvic tilt 2x10 Hooklying with UE press into thighs for TA contraction 2x10 Hooklying posterior pelvic tilt with marching 2x10 Supine bent knee fallouts 2x10 Quadruped cat/cow x10 Seated hamstring stretch 2x20 sec bilat Manual Therapy:  Addaday to lumbar paraspinals, glutes/piriformis, hamstrings, and IT band to bilateral sides in prone position   DATE: 09/26/22 Cat/cow x10 reps  Supine abdominal bracing x10 reps Supine abdominal bracing with alternating LE march x10 reps  BUE pressdown blue TB 2x10 reps  Childs pose over pillow with deep breathing focus for thoracic expansion x10 Manual: addaday to lumbar/thoracic paraspinals, upper traps     PATIENT EDUCATION:  Education details: Access Code: [email or web address] Person educated: Patient Education method: Consulting civil engineer, Media planner, Verbal cues, and Handouts Education comprehension: verbalized understanding and returned demonstration  HOME EXERCISE PROGRAM: Access Code: [email or web address] URL: [email or web address] Date: 09/13/2022 Prepared by: Baruch Merl  Exercises - Seated Hamstring Stretch  - 1 x  daily - 7 x weekly - 1 sets - 3 reps - 30 sec hold - Cat to Child's Pose with Posterior Pelvic Tilt  - 1 x daily - 7 x weekly - 1 sets - 10 reps - 10 sec hold - Seated Lumbar Flexion Stretch  - 1 x daily - 7 x weekly - 1 sets - 5 reps - 10 sec hold  ASSESSMENT:  CLINICAL IMPRESSION: [name]  presents to skilled PT reporting that she has been trying to perform her HEP some.  Pt mentions throughout session how she wish she could "crack my back" stating that "it feels better" after.  Pt educated on the importance of core stabilization and increasing muscle support to maintain proper spinal stability.  Pt with difficulty with hamstring stretch and requires cuing for proper technique and stretch level.  Patient states decreased pain following manual therapy, reporting pain decreased to 3/10 following.  Patient with noted increased trigger points along left hamstring and IT band.    OBJECTIVE IMPAIRMENTS: decreased activity tolerance, decreased coordination, decreased mobility, decreased ROM, decreased strength, hypomobility, impaired flexibility, improper body mechanics, postural dysfunction, and pain.   ACTIVITY LIMITATIONS: carrying, lifting, bending, sitting, standing, squatting, sleeping, stairs, transfers, and locomotion level  PARTICIPATION LIMITATIONS: meal prep, cleaning, laundry, shopping, and community activity  PERSONAL FACTORS: Past/current experiences are also affecting patient's functional outcome.   REHAB POTENTIAL: Excellent  CLINICAL DECISION MAKING: Stable/uncomplicated  EVALUATION COMPLEXITY: Low   GOALS: Goals reviewed with patient? Yes  SHORT TERM GOALS: Target date: 10/11/22  Pt will be ind with initial HEP Baseline: Goal status: IN PROGRESS  2.  Pt will report compliance with significant reduction in attempts to crack spine throughout the day with understand of why  Baseline:  Goal status: INITIAL  3.  Pt will report pain not to exceed 8/10 with daily activities Baseline: 10/10 Goal status: IN PROGRESS  4.  Pt will return demo of proper squat with hip hinge using and proper stabilization of T/L junction Baseline:  Goal status: INITIAL    LONG TERM GOALS: Target date: 11/08/22  Pt will be ind with advanced HEP with proper stabilization strategies  and body mechanics Baseline:  Goal status: INITIAL  2.  Pt will report improved sleep by at least 70% Baseline:  Goal status: INITIAL  3.  Pt will score 10/50 or better on ODI to demo reduced disability from moderate to mild. Baseline: 16/50 Goal status: INITIAL  4.  Pt will report pain control within range of 0-6/10 with daily tasks Baseline: 0-10/10 Goal status: INITIAL  5.  Pt will improve LE strength to at least 4+/5 for improved stability of Rt hemipelvis for stairs, squats, SLS phase of functional movement. Baseline:  Goal status: INITIAL    PLAN:  PT FREQUENCY: 1-2x/week  PT DURATION: 8 weeks  PLANNED INTERVENTIONS: Therapeutic exercises, Therapeutic activity, Neuromuscular re-education, Patient/Family education, Self Care, Joint mobilization, Aquatic Therapy, Dry Needling, Electrical stimulation, Spinal mobilization, Cryotherapy, Moist heat, Taping, Traction, Ionotophoresis '4mg'$ /ml Dexamethasone, and Manual therapy.  PLAN FOR NEXT SESSION: NuStep, encourage HEP adherence, progress LE stretches (consider supine hamstring stretch and IT band stretch with strap), progress core and body mechanics   Juel Burrow, PT 11:52 AM,09/28/22  [facility] [address], [place],  [address] Phone # [phone] Fax [phone]

## 2022-10-04 ENCOUNTER — Ambulatory Visit (INDEPENDENT_AMBULATORY_CARE_PROVIDER_SITE_OTHER): Payer: Medicaid Other | Admitting: Clinical

## 2022-10-04 DIAGNOSIS — F4321 Adjustment disorder with depressed mood: Secondary | ICD-10-CM | POA: Diagnosis not present

## 2022-10-04 NOTE — Patient Instructions (Signed)
[facility] [address] [place], [place] [address] [phone] (main office) [phone] (Shabreka Coulon's office)   

## 2022-10-10 ENCOUNTER — Ambulatory Visit: Payer: Medicaid Other | Attending: Family Medicine

## 2022-10-10 ENCOUNTER — Telehealth: Payer: Self-pay

## 2022-10-10 DIAGNOSIS — M546 Pain in thoracic spine: Secondary | ICD-10-CM | POA: Insufficient documentation

## 2022-10-10 DIAGNOSIS — R252 Cramp and spasm: Secondary | ICD-10-CM | POA: Insufficient documentation

## 2022-10-10 DIAGNOSIS — M5459 Other low back pain: Secondary | ICD-10-CM | POA: Insufficient documentation

## 2022-10-10 NOTE — Telephone Encounter (Signed)
PT called pt due to no-show today.  She forgot her appt and reports that she didn't get a reminder call.  PT confirmed appt 10/12/22 at 10:15

## 2022-10-12 ENCOUNTER — Ambulatory Visit: Payer: Medicaid Other | Admitting: Physical Therapy

## 2022-10-16 ENCOUNTER — Ambulatory Visit: Payer: Medicaid Other

## 2022-10-16 DIAGNOSIS — M5459 Other low back pain: Secondary | ICD-10-CM

## 2022-10-16 DIAGNOSIS — R252 Cramp and spasm: Secondary | ICD-10-CM | POA: Diagnosis present

## 2022-10-16 DIAGNOSIS — M546 Pain in thoracic spine: Secondary | ICD-10-CM

## 2022-10-16 NOTE — Therapy (Signed)
OUTPATIENT PHYSICAL THERAPY TREATMENT NOTE   Patient Name: Tricia Campbell MRN: [id number] DOB:23-Feb-1991, 32 y.o., female Today's Date: 10/16/2022  PCP: [facility] REFERRING PROVIDER: Darron Doom, MD  END OF SESSION:   PT End of Session - 10/16/22 1141     Visit Number 4    Number of Visits 27    Date for PT Re-Evaluation 11/09/22    Authorization Type [employer] MEDICAID, no auth needed over 21yo    PT Start Time 1101    PT Stop Time 1132    PT Time Calculation (min) 31 min    Activity Tolerance Patient tolerated treatment well    Behavior During Therapy WFL for tasks assessed/performed               Past Medical History:  Diagnosis Date   History of marijuana use 08/17/2016   +UDS at first prenatal visit   Vaginal delivery    Past Surgical History:  Procedure Laterality Date   APPENDECTOMY     LAPAROSCOPIC APPENDECTOMY N/A 07/23/2018   Procedure: APPENDECTOMY LAPAROSCOPIC;  Surgeon: Alphonsa Overall, MD;  Location: [facility];  Service: General;  Laterality: N/A;   WISDOM TOOTH EXTRACTION     Patient Active Problem List   Diagnosis Date Noted   History of pregnancy loss, not currently pregnant 09/06/2022    PCP: no PCP  REFERRING PROVIDER: Donnamae Jude, MD  REFERRING DIAG: M54.50,G89.29 (ICD-10-CM) - Chronic bilateral low back pain without sciatica  Rationale for Evaluation and Treatment: Rehabilitation  THERAPY DIAG:  Other low back pain  Pain in thoracic spine  Cramp and spasm  ONSET DATE: since vaginal delivery 5 years ago  SUBJECTIVE:                                                                                                                                                                                           SUBJECTIVE STATEMENT: I've been feeling a lot better overall.  The massage gun helped a lot. I want to get one for home.  I need to leave 15 minutes early for another appt.   PERTINENT HISTORY:  8 weeks s/p  SAB on 07/19/22, was 56w6dalong at the time of loss anxiety  PAIN:  PAIN:  Are you having pain? Yes NPRS scale: 5/10 Pain location: whole back Pain orientation: Right, Left, Upper, and Lower  PAIN TYPE: aching and sharp tight Pain description: constant  Aggravating factors: sleeping, pain is worse when I wake up, side sleeper Relieving factors: cracking my back, getting massages   PRECAUTIONS: None  WEIGHT BEARING RESTRICTIONS: No  FALLS:  Has patient fallen in  last 6 months? No  LIVING ENVIRONMENT: Lives with: lives with their son (61yo) Lives in: House/apartment Stairs: Yes: External: 40 steps; can reach both Has following equipment at home: None  OCCUPATION: unemployed  PLOF: Independent  PATIENT GOALS: relax and loosen I my back, I want to feel less tense  NEXT MD VISIT: unsure  OBJECTIVE:   DIAGNOSTIC FINDINGS:  N/a  PATIENT SURVEYS:  Modified Oswestry 16/50 = moderate disability   SENSATION: WFL  MUSCLE LENGTH: Hamstrings: end range tightness bil   POSTURE: increased lumbar lordosis and decreased thoracic kyphosis, Lt shoulder elevated  PALPATION: Tender Rt>Lt SI joints Spasm and tender along bil paraspinals cervical to pelvis, greater surrounding T/L junction  LUMBAR ROM:   AROM eval  Flexion Fingers to ankles, HS and thoracic stretch  Extension Full with thoracic pain Signif extension hinge at T/L jxn  Right lateral flexion WNL contralateral stretch  Left lateral flexion WNL contralateral stretch  Right rotation 50%  Left rotation 75%   (Blank rows = not tested)  LOWER EXTREMITY ROM:     WNL bil  LOWER EXTREMITY MMT:    MMT Right eval Left eval  Hip flexion 4-   Hip extension 4- 4+  Hip abduction 4- 4+  Hip adduction 5 5  Hip internal rotation 4+ 4+  Hip external rotation 4+ 4+  Knee flexion 4+ 4+  Knee extension 4 4+  Ankle dorsiflexion    Ankle plantarflexion    Ankle inversion    Ankle eversion     (Blank rows = not  tested)  LUMBAR SPECIAL TESTS:  Single leg stance test: Positive  FUNCTIONAL TESTS:  Rt SI joint unlocks in SLS and with squat  GAIT: Distance walked: within clinic Assistive device utilized: None Level of assistance: Complete Independence Comments: no major gait deviations  TODAY'S TREATMENT:                                                                                                                              DATE: 10/16/2022 Nustep level 3 x5 min with PT present to discuss status Seated green pball rollout 5x10 sec Hooklying posterior pelvic tilt 2x10 Hooklying TA contraction with ball squeeze 5" hold x10 Hooklying posterior pelvic tilt with marching 2x10 Seated hamstring stretch 2x20 sec bilat Manual Therapy:  Addaday to lumbar paraspinals, glutes/piriformis  DATE: 09/28/2022 Nustep level 3 x5 min with PT present to discuss status Sidebending over peanut ball x10 bilat Seated green pball rollout 5x10 sec Hooklying posterior pelvic tilt 2x10 Hooklying with UE press into thighs for TA contraction 2x10 Hooklying posterior pelvic tilt with marching 2x10 Supine bent knee fallouts 2x10 Quadruped cat/cow x10 Seated hamstring stretch 2x20 sec bilat Manual Therapy:  Addaday to lumbar paraspinals, glutes/piriformis, hamstrings, and IT band to bilateral sides in prone position   DATE: 09/26/22 Cat/cow x10 reps  Supine abdominal bracing x10 reps Supine abdominal bracing with alternating LE march x10 reps  BUE pressdown blue TB  2x10 reps  Ardine Eng pose over pillow with deep breathing focus for thoracic expansion x10 Manual: addaday to lumbar/thoracic paraspinals, upper traps     PATIENT EDUCATION:  Education details: Access Code: [email or web address] Person educated: Patient Education method: Consulting civil engineer, Demonstration, Verbal cues, and Handouts Education comprehension: verbalized understanding and returned demonstration  HOME EXERCISE PROGRAM: Access Code: [email or web address] URL:  [email or web address] Date: 09/13/2022 Prepared by: Venetia Night Beuhring  Exercises - Seated Hamstring Stretch  - 1 x daily - 7 x weekly - 1 sets - 3 reps - 30 sec hold - Cat to Child's Pose with Posterior Pelvic Tilt  - 1 x daily - 7 x weekly - 1 sets - 10 reps - 10 sec hold - Seated Lumbar Flexion Stretch  - 1 x daily - 7 x weekly - 1 sets - 5 reps - 10 sec hold  ASSESSMENT:  CLINICAL IMPRESSION: Pt arrived and reports minimal pain and just stiffness.  Pt reports that she has been minimally compliant with HEP.  She required max cueing for TA activation and pelvic tilt.  Session was shortened due to needing to leave for another appt and canceled her next appt this week due to conflict in her schedule.  Pt responded well to Addaday.  Patient will benefit from skilled PT to address the below impairments and improve overall function.     OBJECTIVE IMPAIRMENTS: decreased activity tolerance, decreased coordination, decreased mobility, decreased ROM, decreased strength, hypomobility, impaired flexibility, improper body mechanics, postural dysfunction, and pain.   ACTIVITY LIMITATIONS: carrying, lifting, bending, sitting, standing, squatting, sleeping, stairs, transfers, and locomotion level  PARTICIPATION LIMITATIONS: meal prep, cleaning, laundry, shopping, and community activity  PERSONAL FACTORS: Past/current experiences are also affecting patient's functional outcome.   REHAB POTENTIAL: Excellent  CLINICAL DECISION MAKING: Stable/uncomplicated  EVALUATION COMPLEXITY: Low   GOALS: Goals reviewed with patient? Yes  SHORT TERM GOALS: Target date: 10/11/22  Pt will be ind with initial HEP Baseline: Goal status: IN PROGRESS  2.  Pt will report compliance with significant reduction in attempts to crack spine throughout the day with understand of why  Baseline:  Goal status: INITIAL  3.  Pt will report pain not to exceed 8/10 with daily activities Baseline: 10/10 Goal  status: IN PROGRESS  4.  Pt will return demo of proper squat with hip hinge using and proper stabilization of T/L junction Baseline:  Goal status: INITIAL    LONG TERM GOALS: Target date: 11/08/22  Pt will be ind with advanced HEP with proper stabilization strategies and body mechanics Baseline:  Goal status: INITIAL  2.  Pt will report improved sleep by at least 70% Baseline:  Goal status: INITIAL  3.  Pt will score 10/50 or better on ODI to demo reduced disability from moderate to mild. Baseline: 16/50 Goal status: INITIAL  4.  Pt will report pain control within range of 0-6/10 with daily tasks Baseline: 0-10/10 Goal status: INITIAL  5.  Pt will improve LE strength to at least 4+/5 for improved stability of Rt hemipelvis for stairs, squats, SLS phase of functional movement. Baseline:  Goal status: INITIAL    PLAN:  PT FREQUENCY: 1-2x/week  PT DURATION: 8 weeks  PLANNED INTERVENTIONS: Therapeutic exercises, Therapeutic activity, Neuromuscular re-education, Patient/Family education, Self Care, Joint mobilization, Aquatic Therapy, Dry Needling, Electrical stimulation, Spinal mobilization, Cryotherapy, Moist heat, Taping, Traction, Ionotophoresis '4mg'$ /ml Dexamethasone, and Manual therapy.  PLAN FOR NEXT SESSION: more core work with verbal cueing, manual therapy, flexibility  Sigurd Sos, PT 10/16/22 11:41 AM  [facility] [address], [place], [place] [address] Phone # [phone] Fax [phone]

## 2022-10-18 ENCOUNTER — Encounter: Payer: Medicaid Other | Admitting: Rehabilitative and Restorative Service Providers"

## 2022-10-23 ENCOUNTER — Ambulatory Visit: Payer: Medicaid Other

## 2022-10-25 ENCOUNTER — Ambulatory Visit: Payer: Medicaid Other

## 2022-10-25 DIAGNOSIS — M5459 Other low back pain: Secondary | ICD-10-CM

## 2022-10-25 DIAGNOSIS — M546 Pain in thoracic spine: Secondary | ICD-10-CM

## 2022-10-25 DIAGNOSIS — R252 Cramp and spasm: Secondary | ICD-10-CM

## 2022-10-25 NOTE — Therapy (Addendum)
OUTPATIENT PHYSICAL THERAPY TREATMENT NOTE   Patient Name: Tricia Campbell MRN: [id number] DOB:[birth year], 32 y.o., female 55 Date: 10/25/2022  PCP: [facility] REFERRING PROVIDER: Darron Doom, MD  END OF SESSION:   PT End of Session - 10/25/22 1226     Visit Number 5    PT Start Time [id number]    PT Stop Time [id number]    PT Time Calculation (min) 31 min    Activity Tolerance Patient tolerated treatment well    Behavior During Therapy WFL for tasks assessed/performed                Past Medical History:  Diagnosis Date   History of marijuana use 08/17/2016   +UDS at first prenatal visit   Vaginal delivery    Past Surgical History:  Procedure Laterality Date   APPENDECTOMY     LAPAROSCOPIC APPENDECTOMY N/A 07/23/2018   Procedure: APPENDECTOMY LAPAROSCOPIC;  Surgeon: Alphonsa Overall, MD;  Location: [facility];  Service: General;  Laterality: N/A;   WISDOM TOOTH EXTRACTION     Patient Active Problem List   Diagnosis Date Noted   History of pregnancy loss, not currently pregnant 09/06/2022    PCP: no PCP  REFERRING PROVIDER: Donnamae Jude, MD  REFERRING DIAG: M54.50,G89.29 (ICD-10-CM) - Chronic bilateral low back pain without sciatica  Rationale for Evaluation and Treatment: Rehabilitation  THERAPY DIAG:  Other low back pain  Pain in thoracic spine  Cramp and spasm  ONSET DATE: since vaginal delivery 5 years ago  SUBJECTIVE:                                                                                                                                                                                           SUBJECTIVE STATEMENT: 50% overall improvement since the start of care. I do my exercises.  I want today to be my last day.    PERTINENT HISTORY:  8 weeks s/p SAB on 07/19/22, was [redacted]w[redacted]d along at the time of loss anxiety  PAIN:  PAIN:  Are you having pain? Yes NPRS scale: 5/10 Pain location: whole back Pain orientation:  Right, Left, Upper, and Lower  PAIN TYPE: aching and sharp tight Pain description: constant  Aggravating factors: sleeping, pain is worse when I wake up, side sleeper Relieving factors: cracking my back, getting massages   PRECAUTIONS: None  WEIGHT BEARING RESTRICTIONS: No  FALLS:  Has patient fallen in last 6 months? No  LIVING ENVIRONMENT: Lives with: lives with their son (62yo) Lives in: House/apartment Stairs: Yes: External: 40 steps; can reach both Has following equipment at home: None  OCCUPATION: unemployed  PLOF:  Independent  PATIENT GOALS: relax and loosen I my back, I want to feel less tense  NEXT MD VISIT: unsure  OBJECTIVE:   DIAGNOSTIC FINDINGS:  N/a  PATIENT SURVEYS:  Modified Oswestry 16/50 = moderate disability  10/25/22: ODI: 18/50  SENSATION: WFL  MUSCLE LENGTH: Hamstrings: end range tightness bil   POSTURE: increased lumbar lordosis and decreased thoracic kyphosis, Lt shoulder elevated  PALPATION: Tender Rt>Lt SI joints Spasm and tender along bil paraspinals cervical to pelvis, greater surrounding T/L junction  LUMBAR ROM:   AROM eval  Flexion Fingers to ankles, HS and thoracic stretch  Extension Full with thoracic pain Signif extension hinge at T/L jxn  Right lateral flexion WNL contralateral stretch  Left lateral flexion WNL contralateral stretch  Right rotation 50%  Left rotation 75%   (Blank rows = not tested)  LOWER EXTREMITY ROM:     WNL bil  LOWER EXTREMITY MMT:    MMT Right eval Left eval  Hip flexion 4-   Hip extension 4- 4+  Hip abduction 4- 4+  Hip adduction 5 5  Hip internal rotation 4+ 4+  Hip external rotation 4+ 4+  Knee flexion 4+ 4+  Knee extension 4 4+  Ankle dorsiflexion    Ankle plantarflexion    Ankle inversion    Ankle eversion     (Blank rows = not tested) 10/25/22: no change in strength   LUMBAR SPECIAL TESTS:  Single leg stance test: Positive  FUNCTIONAL TESTS:  Rt SI joint unlocks in  SLS and with squat  GAIT: Distance walked: within clinic Assistive device utilized: None Level of assistance: Complete Independence Comments: no major gait deviations  TODAY'S TREATMENT:            DATE: 10/25/2022 Nustep level 3 x6 min with PT present to discuss status Trunk rotation 3x20 seconds  Sidelying clam and reverse clam 2x10 each with TA activation Hooklying TA contraction with ball squeeze 5" hold x10 Seated hamstring stretch 2x20 sec bilat                                                                                                                  DATE: 10/16/2022 Nustep level 3 x5 min with PT present to discuss status Seated green pball rollout 5x10 sec Hooklying posterior pelvic tilt 2x10 Hooklying TA contraction with ball squeeze 5" hold x10 Hooklying posterior pelvic tilt with marching 2x10 Seated hamstring stretch 2x20 sec bilat Manual Therapy:  Addaday to lumbar paraspinals, glutes/piriformis  DATE: 09/28/2022 Nustep level 3 x5 min with PT present to discuss status Sidebending over peanut ball x10 bilat Seated green pball rollout 5x10 sec Hooklying posterior pelvic tilt 2x10 Hooklying with UE press into thighs for TA contraction 2x10 Hooklying posterior pelvic tilt with marching 2x10 Supine bent knee fallouts 2x10 Quadruped cat/cow x10 Seated hamstring stretch 2x20 sec bilat Manual Therapy:  Addaday to lumbar paraspinals, glutes/piriformis, hamstrings, and IT band to bilateral sides in prone position   PATIENT EDUCATION:  Education details: Access Code: [email or web address] Person educated:  Patient Education method: Explanation, Demonstration, Verbal cues, and Handouts Education comprehension: verbalized understanding and returned demonstration  HOME EXERCISE PROGRAM: Access Code: [email or web address] URL: [email or web address] Date: 10/25/2022 Prepared by: Claiborne Billings  Exercises - Seated Hamstring Stretch  - 1 x daily - 7 x weekly - 1 sets - 3 reps - 30 sec  hold - Cat to Child's Pose with Posterior Pelvic Tilt  - 1 x daily - 7 x weekly - 1 sets - 10 reps - 10 sec hold - Seated Lumbar Flexion Stretch  - 1 x daily - 7 x weekly - 1 sets - 5 reps - 10 sec hold - Supine Hip Adduction Isometric with Ball  - 1 x daily - 7 x weekly - 2 sets - 10 reps - Sidelying Reverse Clamshell  - 1 x daily - 7 x weekly - 2 sets - 10 reps - Clamshell  - 1 x daily - 7 x weekly - 2 sets - 10 reps - Supine Lower Trunk Rotation  - 1 x daily - 7 x weekly - 3 sets - 10 reps - Seated Transversus Abdominis Bracing  - 1 x daily - 7 x weekly - 3 sets - 10 reps ASSESSMENT:  CLINICAL IMPRESSION: Pt reports that she is 50% and doesn't feel like she is making much progress with PT.  Pt requested D/C to HEP at this time.  PT added to HEP for core strength.  She was able to demo correctly with minimal tactile and verbal cueing. No change in ODI. Pt will D/C to HEP.     OBJECTIVE IMPAIRMENTS: decreased activity tolerance, decreased coordination, decreased mobility, decreased ROM, decreased strength, hypomobility, impaired flexibility, improper body mechanics, postural dysfunction, and pain.   ACTIVITY LIMITATIONS: carrying, lifting, bending, sitting, standing, squatting, sleeping, stairs, transfers, and locomotion level  PARTICIPATION LIMITATIONS: meal prep, cleaning, laundry, shopping, and community activity  PERSONAL FACTORS: Past/current experiences are also affecting patient's functional outcome.   REHAB POTENTIAL: Excellent  CLINICAL DECISION MAKING: Stable/uncomplicated  EVALUATION COMPLEXITY: Low   GOALS: Goals reviewed with patient? Yes  SHORT TERM GOALS: Target date: 10/11/22  Pt will be ind with initial HEP Baseline: Goal status: MET  2.  Pt will report compliance with significant reduction in attempts to crack spine throughout the day with understand of why  Baseline: "I feel like my back needs to be cracked"  Doing this 25% less  Goal status: Not met  3.   Pt will report pain not to exceed 8/10 with daily activities Baseline: 5/10 Goal status: MET  4.  Pt will return demo of proper squat with hip hinge using and proper stabilization of T/L junction Baseline:  Goal status: INITIAL    LONG TERM GOALS: Target date: 11/08/22  Pt will be ind with advanced HEP with proper stabilization strategies and body mechanics Baseline:  Goal status: met  2.  Pt will report improved sleep by at least 70% Baseline: no changes in sleep (10/25/22) Goal status:not met   3.  Pt will score 10/50 or better on ODI to demo reduced disability from moderate to mild. Baseline: 18/50 (10/25/22) Goal status: Not met   4.  Pt will report pain control within range of 0-6/10 with daily tasks Baseline: 0-5/10 Goal status: MET   5.  Pt will improve LE strength to at least 4+/5 for improved stability of Rt hemipelvis for stairs, squats, SLS phase of functional movement. Baseline:  Goal status: Not met     PLAN:  D/C PT to  HEP PHYSICAL THERAPY DISCHARGE SUMMARY  Visits from Start of Care: 5  Current functional level related to goals / functional outcomes: See above.  Pt with intermittent LBP based on activity.  Pain is 5/10 max now.     Remaining deficits: LBP related to activity.  Pt would like to follow-up with MD for further recommendations.     Education / Equipment: HEP, Economist    Patient agrees to discharge. Patient goals were partially met. Patient is being discharged due to the patient's request.  Sigurd Sos, PT 10/25/22 12:26 PM   [facility] [address] [place], [place] [address] Phone # [phone] Fax [phone]

## 2022-10-31 ENCOUNTER — Encounter: Payer: Medicaid Other | Admitting: Rehabilitative and Restorative Service Providers"

## 2022-11-05 ENCOUNTER — Encounter: Payer: Medicaid Other | Admitting: Rehabilitative and Restorative Service Providers"

## 2022-11-07 ENCOUNTER — Encounter: Payer: Medicaid Other | Admitting: Rehabilitative and Restorative Service Providers"

## 2022-12-24 ENCOUNTER — Ambulatory Visit (INDEPENDENT_AMBULATORY_CARE_PROVIDER_SITE_OTHER): Payer: Medicaid Other | Admitting: General Practice

## 2022-12-24 VITALS — BP 126/68 | HR 101 | Ht 66.0 in | Wt 122.0 lb

## 2022-12-24 DIAGNOSIS — N946 Dysmenorrhea, unspecified: Secondary | ICD-10-CM

## 2022-12-24 DIAGNOSIS — Z3042 Encounter for surveillance of injectable contraceptive: Secondary | ICD-10-CM | POA: Diagnosis not present

## 2022-12-24 LAB — POCT PREGNANCY, URINE: Preg Test, Ur: NEGATIVE

## 2022-12-24 MED ORDER — IBUPROFEN 800 MG PO TABS
800.0000 mg | ORAL_TABLET | Freq: Three times a day (TID) | ORAL | 0 refills | Status: DC | PRN
Start: 2022-12-24 — End: 2023-03-28

## 2022-12-24 MED ORDER — MEDROXYPROGESTERONE ACETATE 150 MG/ML IM SUSY
150.0000 mg | PREFILLED_SYRINGE | Freq: Once | INTRAMUSCULAR | Status: AC
  Administered 2022-12-24: 150 mg via INTRAMUSCULAR

## 2022-12-24 NOTE — Progress Notes (Signed)
Tricia Campbell here for Depo-Provera Injection. Injection administered without complication. Patient has lost 20lbs since last visit in February. She reports only eating once a day, feeling exhausted all the time since loss of twins in December. Offered referral to [facility] and patient accepts. She would like refill on ibuprofen for period cramps. Refill sent to pharmacy per protocol- advised patient to eat before taking ibuprofen. Patient will return in 3 months for next injection between Aug 5 and Aug 19. Next annual visit due February 2025.   Marylynn Pearson, RN 12/24/2022  9:20 AM

## 2022-12-25 ENCOUNTER — Telehealth: Payer: Self-pay | Admitting: Licensed Clinical Social Worker

## 2022-12-25 NOTE — Telephone Encounter (Signed)
Called pt twice to schedule [facility] appt. Left message requesting callback.

## 2023-03-11 ENCOUNTER — Ambulatory Visit (INDEPENDENT_AMBULATORY_CARE_PROVIDER_SITE_OTHER): Payer: Medicaid Other

## 2023-03-11 VITALS — BP 132/72 | HR 109 | Wt 125.4 lb

## 2023-03-11 DIAGNOSIS — Z3042 Encounter for surveillance of injectable contraceptive: Secondary | ICD-10-CM | POA: Diagnosis not present

## 2023-03-11 MED ORDER — MEDROXYPROGESTERONE ACETATE 150 MG/ML IM SUSY
150.0000 mg | PREFILLED_SYRINGE | Freq: Once | INTRAMUSCULAR | Status: AC
Start: 2023-03-11 — End: 2023-03-11
  Administered 2023-03-11: 150 mg via INTRAMUSCULAR

## 2023-03-11 NOTE — Progress Notes (Signed)
Tricia Campbell here for Depo-Provera Injection. Injection administered without complication. Patient will return in 3 months for next injection between 05/27/23 and 06/10/23. Next annual visit due February 2025.   Pt expresses concern about ongoing tooth pain, headaches, and abdominal cramping and requests refills of ibuprofen to be available when she needs them. Explained the importance of knowing the cause of the pain so that it can be better treated. Does have established dentist but next available appt is September. Reviewed [facility] for emergencies; pt states she has been there to have a tooth removed and had a negative experience due to a lack of numbing prior to procedure. Plans to follow up with her own dentist. Also reports feeling her heart racing and hands shaking at times. No current PCP. Reviewed how to schedule new patient visit with PCP on [facility] website using patient's personal phone (first appt as soon at 03/13/23).  Per chart review pt was referred to [facility] at [place] location but Sue Lush was unable to reach patient. MyChart message sent to patient after visit today to offer to reschedule this if desired.  Marjo Bicker, RN 03/11/2023  9:46 AM

## 2023-03-25 ENCOUNTER — Ambulatory Visit (INDEPENDENT_AMBULATORY_CARE_PROVIDER_SITE_OTHER): Payer: Medicaid Other | Admitting: Family Medicine

## 2023-03-25 ENCOUNTER — Encounter (HOSPITAL_BASED_OUTPATIENT_CLINIC_OR_DEPARTMENT_OTHER): Payer: Self-pay | Admitting: Family Medicine

## 2023-03-25 ENCOUNTER — Ambulatory Visit: Payer: Medicaid Other | Attending: Family Medicine

## 2023-03-25 VITALS — BP 128/55 | HR 94 | Ht 66.0 in | Wt 125.0 lb

## 2023-03-25 DIAGNOSIS — R55 Syncope and collapse: Secondary | ICD-10-CM | POA: Insufficient documentation

## 2023-03-25 DIAGNOSIS — R0989 Other specified symptoms and signs involving the circulatory and respiratory systems: Secondary | ICD-10-CM

## 2023-03-25 NOTE — Progress Notes (Addendum)
New Patient Office Visit  Subjective:   Tricia Campbell [birth year] 03/25/2023  Chief Complaint  Patient presents with   Fatigue   Palpitations    Going on since about December/January    HPI: Tricia Campbell presents today to establish care at [facility] at [facility]. Introduced to Publishing rights manager role and practice setting.  All questions answered.   Last PCP: None, does see OBGYN Last annual physical: None recently  Concerns: See below   PALPITATIONS  Patient states she had a syncopal episode on Friday 03/22/23. She states she has  noticed palpitations, heart racing and feeling of "needing something sweet" at times. She states she had miscarriage of twins in December 2023. She states at times she feels the need for deep breathing due to palpitations or heart racing. Denies edema, cough, blurred vision. Does report headaches daily.  Duration: months- 6 months Duration of episode: minutes- syncopal episode Frequency: recurrentl Activity when event occurred: rest Related to exertion: no Dyspnea: no Chest pain: no Syncope:  Yes, states she was standing in a line and began feeling overheated and dizzy. She sat down on the floor and "blackout out for a little bit".  Anxiety/stress: no Nausea/vomiting: no Diaphoresis: yes Coronary artery disease: no Congestive heart failure: no Arrhythmia:no Thyroid disease: no Tobacco Use: Smoke 1-2 joints marijuana daily Caffeine intake: Heavy caffeine consumption with redbulls Status:  fluctuating Treatments attempted:none   The following portions of the patient's history were reviewed and updated as appropriate: past medical history, past surgical history, family history, social history, allergies, medications, and problem list.   Patient Active Problem List   Diagnosis Date Noted   History of pregnancy loss, not currently pregnant 09/06/2022   Past Medical History:   Diagnosis Date   History of marijuana use 08/17/2016   +UDS at first prenatal visit   Vaginal delivery    Past Surgical History:  Procedure Laterality Date   APPENDECTOMY     LAPAROSCOPIC APPENDECTOMY N/A 07/23/2018   Procedure: APPENDECTOMY LAPAROSCOPIC;  Surgeon: Ovidio Kin, MD;  Location: [facility];  Service: General;  Laterality: N/A;   WISDOM TOOTH EXTRACTION     Family History  Problem Relation Age of Onset   Asthma Neg Hx    Diabetes Neg Hx    Heart disease Neg Hx    Hypertension Neg Hx    Kidney disease Neg Hx    Stroke Neg Hx    Social History   Socioeconomic History   Marital status: Single    Spouse name: Not on file   Number of children: Not on file   Years of education: Not on file   Highest education level: Not on file  Occupational History   Not on file  Tobacco Use   Smoking status: Former    Current packs/day: 0.00    Types: Cigars, Cigarettes    Quit date: 12/05/2015    Years since quitting: 7.3   Smokeless tobacco: Never   Tobacco comments:    Black and milds some days  Vaping Use   Vaping status: Never Used  Substance and Sexual Activity   Alcohol use: No   Drug use: No   Sexual activity: Not Currently  Other Topics Concern   Not on file  Social History Narrative   Not on file   Social Determinants of Health   Financial Resource Strain: Not on file  Food Insecurity: No Food Insecurity (07/19/2022)   Hunger Vital Sign  Worried About Programme researcher, broadcasting/film/video in the Last Year: Never true    Ran Out of Food in the Last Year: Never true  Transportation Needs: No Transportation Needs (07/19/2022)   PRAPARE - Administrator, Civil Service (Medical): No    Lack of Transportation (Non-Medical): No  Physical Activity: Not on file  Stress: Not on file  Social Connections: Not on file  Intimate Partner Violence: Not At Risk (07/19/2022)   Humiliation, Afraid, Rape, and Kick questionnaire    Fear of Current or Ex-Partner: No     Emotionally Abused: No    Physically Abused: No    Sexually Abused: No   Outpatient Medications Prior to Visit  Medication Sig Dispense Refill   ibuprofen (ADVIL) 800 MG tablet Take 1 tablet (800 mg total) by mouth every 8 (eight) hours as needed. 30 tablet 0   No facility-administered medications prior to visit.   Allergies  Allergen Reactions   Latex Rash    ROS: A complete ROS was performed with pertinent positives/negatives noted in the HPI. The remainder of the ROS are negative.   Objective:   Today's Vitals   03/25/23 1526 03/25/23 1545  BP: (!) 128/55   Pulse: (!) 102 94  SpO2: 100%   Weight: 125 lb (56.7 kg)   Height: 5\' 6"  (1.676 m)     GENERAL: Well-appearing, in NAD. Well nourished.  SKIN: Pink, warm and dry. No rash, lesion, ulceration, or ecchymoses.  Head: Normocephalic. NECK: Trachea midline. Full ROM w/o pain or tenderness. No lymphadenopathy.  EYES: Conjunctiva clear without exudates. EOMI, PERRL, no drainage present.  RESPIRATORY: Chest wall symmetrical. Respirations even and non-labored. Breath sounds clear to auscultation bilaterally.  CARDIAC: S1, S2 present, regular rate and rhythm without murmur or gallops. Peripheral pulses 2+ bilaterally. Bilateral carotid bruit present without thrill. No murmur.  MSK: Muscle tone and strength appropriate for age. Joints w/o tenderness, redness, or swelling.  EXTREMITIES: Without clubbing, cyanosis, or edema.  NEUROLOGIC: No motor or sensory deficits. Steady, even gait. C2-C12 intact.  PSYCH/MENTAL STATUS: Alert, oriented x 3. Cooperative, appropriate mood and affect.   EKG: Sinus Rhythm, right atrial enlargement. Borderline EKG. No significant changes to rhythm when compared to August 24, 2015. Right atrial enlargement is new finding.       Assessment & Plan:  1. Syncope, unspecified syncope type 2. Bilateral carotid bruits Will lab work today to evaluate for anemias, thyroid deficiency or electrolyte issues  that could be causing dizziness, palpitations and possible syncope.  Recommend patient decrease marijuana and caffeine use as well.  Given finding of right atrial enlargement and bilateral carotid bruits, will obtain echocardiogram, Zio patch and bilateral carotid ultrasound.  Discussed the need for completing these test with patient and she verbalized understanding.  Also discussed that she should seek emergency care if the symptoms return and she verbalized understanding.  - EKG 12-Lead - EKG 12-Lead - LONG TERM MONITOR (3-14 DAYS); Future - ECHOCARDIOGRAM COMPLETE; Future - Lipid panel - Comprehensive metabolic panel - CBC with Differential/Platelet - Hemoglobin A1c - Magnesium - Iron, TIBC and Ferritin Panel - Vitamin B12 - TSH+T4F+T3Free - VAS US CAROTID; Future    Patient to reach out to office if new, worrisome, or unresolved symptoms arise or if no improvement in patient's condition. Patient verbalized understanding and is agreeable to treatment plan. All questions answered to patient's satisfaction.    Return in about 4 weeks (around 04/22/2023) for ANNUAL PHYSICAL.   Of note, portions  of this note may have been created with voice recognition software Physicist, medical). While this note has been edited for accuracy, occasional wrong-word or 'sound-a-like' substitutions may have occurred due to the inherent limitations of voice recognition software.  Yolanda Manges, FNP

## 2023-03-25 NOTE — Progress Notes (Unsigned)
Enrolled patient for a 7 day Zio XT monitor to be mailed to patients home   DOD to read 

## 2023-03-26 ENCOUNTER — Inpatient Hospital Stay (HOSPITAL_COMMUNITY)
Admission: EM | Admit: 2023-03-26 | Discharge: 2023-03-28 | DRG: 644 | Disposition: A | Discharge status: Home / Self Care | Payer: Medicaid Other | Attending: Student | Admitting: Student

## 2023-03-26 ENCOUNTER — Emergency Department (HOSPITAL_COMMUNITY): Payer: Medicaid Other

## 2023-03-26 ENCOUNTER — Encounter (HOSPITAL_COMMUNITY): Payer: Self-pay | Admitting: Internal Medicine

## 2023-03-26 ENCOUNTER — Other Ambulatory Visit (HOSPITAL_BASED_OUTPATIENT_CLINIC_OR_DEPARTMENT_OTHER): Payer: Self-pay | Admitting: Family Medicine

## 2023-03-26 ENCOUNTER — Other Ambulatory Visit: Payer: Self-pay

## 2023-03-26 DIAGNOSIS — I119 Hypertensive heart disease without heart failure: Secondary | ICD-10-CM | POA: Diagnosis present

## 2023-03-26 DIAGNOSIS — E039 Hypothyroidism, unspecified: Secondary | ICD-10-CM | POA: Diagnosis present

## 2023-03-26 DIAGNOSIS — R55 Syncope and collapse: Secondary | ICD-10-CM | POA: Diagnosis present

## 2023-03-26 DIAGNOSIS — E0590 Thyrotoxicosis, unspecified without thyrotoxic crisis or storm: Secondary | ICD-10-CM | POA: Diagnosis not present

## 2023-03-26 DIAGNOSIS — Z91040 Latex allergy status: Secondary | ICD-10-CM

## 2023-03-26 DIAGNOSIS — N946 Dysmenorrhea, unspecified: Secondary | ICD-10-CM

## 2023-03-26 DIAGNOSIS — Z87891 Personal history of nicotine dependence: Secondary | ICD-10-CM

## 2023-03-26 DIAGNOSIS — Z91048 Other nonmedicinal substance allergy status: Secondary | ICD-10-CM

## 2023-03-26 DIAGNOSIS — E872 Acidosis, unspecified: Secondary | ICD-10-CM | POA: Diagnosis present

## 2023-03-26 DIAGNOSIS — E0500 Thyrotoxicosis with diffuse goiter without thyrotoxic crisis or storm: Principal | ICD-10-CM | POA: Diagnosis present

## 2023-03-26 LAB — CBC WITH DIFFERENTIAL/PLATELET
Abs Immature Granulocytes: 0.01 10*3/uL (ref 0.00–0.07)
Basophils Absolute: 0 10*3/uL (ref 0.0–0.2)
Basophils Absolute: 0 10*3/uL (ref 0.0–0.1)
Basophils Relative: 0 %
Basos: 0 %
EOS (ABSOLUTE): 0.1 10*3/uL (ref 0.0–0.4)
Eos: 1 %
Eosinophils Absolute: 0.1 10*3/uL (ref 0.0–0.5)
Eosinophils Relative: 1 %
HCT: 38.9 % (ref 36.0–46.0)
Hematocrit: 38 % (ref 34.0–46.6)
Hemoglobin: 12.5 g/dL (ref 11.1–15.9)
Hemoglobin: 12.3 g/dL (ref 12.0–15.0)
Immature Grans (Abs): 0 10*3/uL (ref 0.0–0.1)
Immature Granulocytes: 0 %
Immature Granulocytes: 0 %
Lymphocytes Absolute: 2 10*3/uL (ref 0.7–3.1)
Lymphocytes Relative: 32 %
Lymphs Abs: 2.2 10*3/uL (ref 0.7–4.0)
Lymphs: 32 %
MCH: 27.1 pg (ref 26.6–33.0)
MCH: 26.7 pg (ref 26.0–34.0)
MCHC: 32.9 g/dL (ref 31.5–35.7)
MCHC: 31.6 g/dL (ref 30.0–36.0)
MCV: 82 fL (ref 79–97)
MCV: 84.6 fL (ref 80.0–100.0)
Monocytes Absolute: 0.7 10*3/uL (ref 0.1–0.9)
Monocytes Absolute: 0.8 10*3/uL (ref 0.1–1.0)
Monocytes Relative: 12 %
Monocytes: 11 %
Neutro Abs: 3.8 10*3/uL (ref 1.7–7.7)
Neutrophils Absolute: 3.4 10*3/uL (ref 1.4–7.0)
Neutrophils Relative %: 55 %
Neutrophils: 56 %
Platelets: 268 10*3/uL (ref 150–450)
Platelets: 255 10*3/uL (ref 150–400)
RBC: 4.61 x10E6/uL (ref 3.77–5.28)
RBC: 4.6 MIL/uL (ref 3.87–5.11)
RDW: 13.3 % (ref 11.7–15.4)
RDW: 13.8 % (ref 11.5–15.5)
WBC: 6.1 10*3/uL (ref 3.4–10.8)
WBC: 6.9 10*3/uL (ref 4.0–10.5)
nRBC: 0 % (ref 0.0–0.2)

## 2023-03-26 LAB — URINALYSIS, ROUTINE W REFLEX MICROSCOPIC
Bilirubin Urine: NEGATIVE
Glucose, UA: NEGATIVE mg/dL
Hgb urine dipstick: NEGATIVE
Ketones, ur: NEGATIVE mg/dL
Leukocytes,Ua: NEGATIVE
Nitrite: NEGATIVE
Protein, ur: NEGATIVE mg/dL
Specific Gravity, Urine: 1.014 (ref 1.005–1.030)
pH: 7 (ref 5.0–8.0)

## 2023-03-26 LAB — RAPID URINE DRUG SCREEN, HOSP PERFORMED
Amphetamines: NOT DETECTED
Barbiturates: NOT DETECTED
Benzodiazepines: NOT DETECTED
Cocaine: NOT DETECTED
Opiates: NOT DETECTED
Tetrahydrocannabinol: POSITIVE — AB

## 2023-03-26 LAB — BASIC METABOLIC PANEL
Anion gap: 11 (ref 5–15)
BUN: 7 mg/dL (ref 6–20)
CO2: 21 mmol/L — ABNORMAL LOW (ref 22–32)
Calcium: 9.4 mg/dL (ref 8.9–10.3)
Chloride: 107 mmol/L (ref 98–111)
Creatinine, Ser: 0.41 mg/dL — ABNORMAL LOW (ref 0.44–1.00)
GFR, Estimated: >60 mL/min (ref >60)
Glucose, Bld: 90 mg/dL (ref 70–99)
Potassium: 4.1 mmol/L (ref 3.5–5.1)
Sodium: 139 mmol/L (ref 135–145)

## 2023-03-26 LAB — IRON,TIBC AND FERRITIN PANEL
Ferritin: 94 ng/mL (ref 15–150)
Iron Saturation: 23 % (ref 15–55)
Iron: 59 ug/dL (ref 27–159)
Total Iron Binding Capacity: 259 ug/dL (ref 250–450)
UIBC: 200 ug/dL (ref 131–425)

## 2023-03-26 LAB — COMPREHENSIVE METABOLIC PANEL
ALT: 18 IU/L (ref 0–32)
AST: 14 IU/L (ref 0–40)
Albumin: 4.2 g/dL (ref 4.0–5.0)
Alkaline Phosphatase: 146 IU/L — ABNORMAL HIGH (ref 44–121)
BUN/Creatinine Ratio: 16 (ref 9–23)
BUN: 10 mg/dL (ref 6–20)
Bilirubin Total: 0.6 mg/dL (ref 0.0–1.2)
CO2: 21 mmol/L (ref 20–29)
Calcium: 10.1 mg/dL (ref 8.7–10.2)
Chloride: 107 mmol/L — ABNORMAL HIGH (ref 96–106)
Creatinine, Ser: 0.64 mg/dL (ref 0.57–1.00)
Globulin, Total: 2.9 g/dL (ref 1.5–4.5)
Glucose: 113 mg/dL — ABNORMAL HIGH (ref 70–99)
Potassium: 5.7 mmol/L — ABNORMAL HIGH (ref 3.5–5.2)
Sodium: 141 mmol/L (ref 134–144)
Total Protein: 7.1 g/dL (ref 6.0–8.5)
eGFR: 122 mL/min/{1.73_m2} (ref >59)

## 2023-03-26 LAB — VITAMIN B12: Vitamin B-12: 492 pg/mL (ref 232–1245)

## 2023-03-26 LAB — HEPATIC FUNCTION PANEL
ALT: 17 U/L (ref 0–44)
AST: 23 U/L (ref 15–41)
Albumin: 3.7 g/dL (ref 3.5–5.0)
Alkaline Phosphatase: 113 U/L (ref 38–126)
Bilirubin, Direct: 0.2 mg/dL (ref 0.0–0.2)
Indirect Bilirubin: 0.5 mg/dL (ref 0.3–0.9)
Total Bilirubin: 0.7 mg/dL (ref 0.3–1.2)
Total Protein: 7.5 g/dL (ref 6.5–8.1)

## 2023-03-26 LAB — LIPID PANEL
Chol/HDL Ratio: 1.5 ratio (ref 0.0–4.4)
Cholesterol, Total: 78 mg/dL — ABNORMAL LOW (ref 100–199)
HDL: 52 mg/dL (ref >39)
LDL Chol Calc (NIH): 10 mg/dL (ref 0–99)
Triglycerides: 71 mg/dL (ref 0–149)
VLDL Cholesterol Cal: 16 mg/dL (ref 5–40)

## 2023-03-26 LAB — HEMOGLOBIN A1C
Est. average glucose Bld gHb Est-mCnc: 120 mg/dL
Hgb A1c MFr Bld: 5.8 % — ABNORMAL HIGH (ref 4.8–5.6)

## 2023-03-26 LAB — MAGNESIUM
Magnesium: 1.8 mg/dL (ref 1.6–2.3)
Magnesium: 2.1 mg/dL (ref 1.7–2.4)

## 2023-03-26 LAB — PREGNANCY, URINE: Preg Test, Ur: NEGATIVE

## 2023-03-26 LAB — TSH+T4F+T3FREE
Free T4: >7.77 ng/dL — ABNORMAL HIGH (ref 0.82–1.77)
T3, Free: 28.1 pg/mL (ref 2.0–4.4)
TSH: <0.005 u[IU]/mL — ABNORMAL LOW (ref 0.450–4.500)

## 2023-03-26 LAB — TROPONIN I (HIGH SENSITIVITY)
Troponin I (High Sensitivity): 3 ng/L (ref <18)
Troponin I (High Sensitivity): 4 ng/L (ref <18)

## 2023-03-26 LAB — BRAIN NATRIURETIC PEPTIDE: B Natriuretic Peptide: 237.1 pg/mL — ABNORMAL HIGH (ref 0.0–100.0)

## 2023-03-26 MED ORDER — ACETAMINOPHEN 325 MG PO TABS: 650.0000 mg | ORAL_TABLET | Freq: Four times a day (QID) | ORAL | Status: DC | PRN

## 2023-03-26 MED ORDER — METHIMAZOLE 10 MG PO TABS
20.0000 mg | ORAL_TABLET | Freq: Three times a day (TID) | ORAL | Status: DC
  Administered 2023-03-26 – 2023-03-28 (×5): 20 mg via ORAL
  Filled 2023-03-26 (×5): qty 2

## 2023-03-26 MED ORDER — ENOXAPARIN SODIUM 40 MG/0.4ML IJ SOSY
40.0000 mg | PREFILLED_SYRINGE | INTRAMUSCULAR | Status: DC
  Filled 2023-03-26: qty 0.4

## 2023-03-26 MED ORDER — ONDANSETRON HCL 4 MG PO TABS: 4.0000 mg | ORAL_TABLET | Freq: Four times a day (QID) | ORAL | Status: DC | PRN

## 2023-03-26 MED ORDER — METHIMAZOLE 10 MG PO TABS: 20.0000 mg | ORAL_TABLET | Freq: Three times a day (TID) | ORAL | Status: DC

## 2023-03-26 MED ORDER — PROPRANOLOL HCL 20 MG PO TABS
20.0000 mg | ORAL_TABLET | Freq: Four times a day (QID) | ORAL | Status: DC
  Administered 2023-03-26: 20 mg via ORAL
  Filled 2023-03-26: qty 1

## 2023-03-26 MED ORDER — METHIMAZOLE 10 MG PO TABS
20.0000 mg | ORAL_TABLET | Freq: Three times a day (TID) | ORAL | Status: DC
  Filled 2023-03-26: qty 2

## 2023-03-26 MED ORDER — HYDROCORTISONE SOD SUC (PF) 100 MG IJ SOLR
100.0000 mg | Freq: Once | INTRAMUSCULAR | Status: AC
  Administered 2023-03-26: 100 mg via INTRAVENOUS
  Filled 2023-03-26: qty 2

## 2023-03-26 MED ORDER — ACETAMINOPHEN 650 MG RE SUPP: 650.0000 mg | Freq: Four times a day (QID) | RECTAL | Status: DC | PRN

## 2023-03-26 MED ORDER — ONDANSETRON HCL 4 MG/2ML IJ SOLN
4.0000 mg | Freq: Four times a day (QID) | INTRAMUSCULAR | Status: DC | PRN
  Administered 2023-03-27: 4 mg via INTRAVENOUS
  Filled 2023-03-26: qty 2

## 2023-03-26 MED ORDER — ACETAMINOPHEN 325 MG PO TABS
650.0000 mg | ORAL_TABLET | Freq: Once | ORAL | Status: AC
  Administered 2023-03-26: 650 mg via ORAL
  Filled 2023-03-26: qty 2

## 2023-03-26 MED ORDER — PROPRANOLOL HCL 20 MG PO TABS
20.0000 mg | ORAL_TABLET | Freq: Four times a day (QID) | ORAL | Status: DC
  Administered 2023-03-27 – 2023-03-28 (×5): 20 mg via ORAL
  Filled 2023-03-26 (×5): qty 1

## 2023-03-26 NOTE — Progress Notes (Signed)
Patient was called by PCP repeatedly at 0900, 1000 and 11:30 without answer. Pt was made aware to call office by her emergency contact on chart. PCP spoke with her regarding recent labs and critical results. PCP concerned for thyroid storm due to lab results, patient's symptoms with mild confusion at visit, tachycardia, and prior syncopal episode.She is agreeable to go to the ER now due to concern of thyroid storm and need for medication, US thyroid STAT and stat endo consult.  PCP called report to ER Provider Patty and patient is coming by private vehicle.

## 2023-03-26 NOTE — ED Notes (Signed)
Pt coming POV after labs obtained yesterday at PCP [facility]. Pt presented with symptoms of Thyroid storm, Tachy, Palpations, generalized weakness, syncopal episode.  Labs:  TSH < 0.005 T3 - 28.1 T4 - 7.77 Cal - 5.7 Pt is coming POV for further evaluation.

## 2023-03-26 NOTE — H&P (Signed)
History and Physical    Patient: Tricia Campbell UUV:[id number] DOB: 08-12-1990 DOA: 03/26/2023 DOS: the patient was seen and examined on 03/26/2023 PCP: Hilbert Bible, FNP  Patient coming from: Home  Chief Complaint:  Chief Complaint  Patient presents with   Tachycardia   HPI: Tricia Campbell is a 32 y.o. female with medical history significant of no chronic medical conditions.  Pt seen at PCP office yesterday to establish care after a syncopal episode 4 days prior.  Lab work at PCPs office showed severe hyperthyroidism.  Pt called by PCP and sent in to ED given severity of hyperthyroidism on lab work.  Currently patient is asymptomatic.  She states that she has had intermittent palpitations over the past several months. These have worsened in intensity and frequency over the past several weeks. She describes her syncopal episode 4 days ago as near syncope, never fully passing out but becoming so weak that she fell to the floor. She was briefly unable to get up from the floor due to the weakness.  She will have intermittent headaches.  She denies episodes of diaphoresis other than her near syncopal episode 4 days ago.  She will feel flushed at times.  She will feel short of breath at times.  She had a miscarriage of twins in December 2023.  Since then, she has been on birth control.  She will have occasional mild vaginal bleeding.  This has been ongoing since her pregnancy loss.  Other than birth control, she currently does not take any daily medications.  She will take ibuprofen for treatment of her frequent headaches.    Review of Systems: As mentioned in the history of present illness. All other systems reviewed and are negative. Past Medical History:  Diagnosis Date   History of marijuana use 08/17/2016   +UDS at first prenatal visit   Vaginal delivery    Past Surgical History:  Procedure Laterality Date   APPENDECTOMY     LAPAROSCOPIC APPENDECTOMY N/A  07/23/2018   Procedure: APPENDECTOMY LAPAROSCOPIC;  Surgeon: Ovidio Kin, MD;  Location: [facility];  Service: General;  Laterality: N/A;   WISDOM TOOTH EXTRACTION     Social History:  reports that she quit smoking about 7 years ago. Her smoking use included cigars and cigarettes. She has never used smokeless tobacco. She reports that she does not drink alcohol and does not use drugs.  Allergies  Allergen Reactions   Latex Rash    Family History  Problem Relation Age of Onset   Asthma Neg Hx    Diabetes Neg Hx    Heart disease Neg Hx    Hypertension Neg Hx    Kidney disease Neg Hx    Stroke Neg Hx     Prior to Admission medications   Medication Sig Start Date End Date Taking? Authorizing Provider  ibuprofen (ADVIL) 800 MG tablet Take 1 tablet (800 mg total) by mouth every 8 (eight) hours as needed. 12/24/22   Reva Bores, MD    Physical Exam: Vitals:   03/26/23 1428 03/26/23 1430 03/26/23 1845 03/26/23 1854  BP:  (!) 157/146 136/71   Pulse:  92 95   Resp:   (!) 21   Temp:    98.1 F (36.7 C)  TempSrc:    Oral  SpO2:  100% 100%   Weight: 57 kg     Height: 5\' 6"  (1.676 m)      Constitutional: NAD, calm, comfortable Eyes: PERRL, lids and conjunctivae normal, No obvious exophthalmos  on exam Respiratory: clear to auscultation bilaterally, no wheezing, no crackles. Normal respiratory effort. No accessory muscle use.  Cardiovascular: Regular rate and rhythm, no murmurs / rubs / gallops. No extremity edema. 2+ pedal pulses. No carotid bruits.  Abdomen: no tenderness, no masses palpated. No hepatosplenomegaly. Bowel sounds positive.  Neurologic: CN 2-12 grossly intact. Sensation intact, DTR normal. Strength 5/5 in all 4.  Psychiatric: Normal judgment and insight. Alert and oriented x 3. Normal mood.   Data Reviewed:    Labs on Admission: I have personally reviewed following labs and imaging studies  CBC: Recent Labs  Lab 03/25/23 1610 03/26/23 1545  WBC 6.1 6.9   NEUTROABS 3.4 3.8  HGB 12.5 12.3  HCT 38.0 38.9  MCV 82 84.6  PLT 268 255   Basic Metabolic Panel: Recent Labs  Lab 03/25/23 1610 03/26/23 1545  NA 141 139  K 5.7* 4.1  CL 107* 107  CO2 21 21*  GLUCOSE 113* 90  BUN 10 7  CREATININE 0.64 0.41*  CALCIUM 10.1 9.4  MG 1.8 2.1   GFR: Estimated Creatinine Clearance: 92.5 mL/min (A) (by C-G formula based on SCr of 0.41 mg/dL (L)). Liver Function Tests: Recent Labs  Lab 03/25/23 1610 03/26/23 1545  AST 14 23  ALT 18 17  ALKPHOS 146* 113  BILITOT 0.6 0.7  PROT 7.1 7.5  ALBUMIN 4.2 3.7   No results for input(s): "LIPASE", "AMYLASE" in the last 168 hours. No results for input(s): "AMMONIA" in the last 168 hours. Coagulation Profile: No results for input(s): "INR", "PROTIME" in the last 168 hours. Cardiac Enzymes: No results for input(s): "CKTOTAL", "CKMB", "CKMBINDEX", "TROPONINI" in the last 168 hours. BNP (last 3 results) No results for input(s): "PROBNP" in the last 8760 hours. HbA1C: Recent Labs    03/25/23 1610  HGBA1C 5.8*   CBG: No results for input(s): "GLUCAP" in the last 168 hours. Lipid Profile: Recent Labs    03/25/23 1610  CHOL 78*  HDL 52  LDLCALC 10  TRIG 71  CHOLHDL 1.5   Thyroid Function Tests: Recent Labs    03/25/23 1610  TSH <0.005*  FREET4 >7.77*  T3FREE 28.1*   Anemia Panel: Recent Labs    03/25/23 1610  VITAMINB12 492  FERRITIN 94  TIBC 259  IRON 59   Urine analysis:    Component Value Date/Time   COLORURINE YELLOW 03/26/2023 1545   APPEARANCEUR CLEAR 03/26/2023 1545   LABSPEC 1.014 03/26/2023 1545   PHURINE 7.0 03/26/2023 1545   GLUCOSEU NEGATIVE 03/26/2023 1545   HGBUR NEGATIVE 03/26/2023 1545   BILIRUBINUR NEGATIVE 03/26/2023 1545   KETONESUR NEGATIVE 03/26/2023 1545   PROTEINUR NEGATIVE 03/26/2023 1545   UROBILINOGEN 0.2 08/11/2020 1632   NITRITE NEGATIVE 03/26/2023 1545   LEUKOCYTESUR NEGATIVE 03/26/2023 1545    Radiological Exams on Admission: CT Head  Wo Contrast  Result Date: 03/26/2023 CLINICAL DATA:  Headache, increasing frequency or severity EXAM: CT HEAD WITHOUT CONTRAST TECHNIQUE: Contiguous axial images were obtained from the base of the skull through the vertex without intravenous contrast. RADIATION DOSE REDUCTION: This exam was performed according to the departmental dose-optimization program which includes automated exposure control, adjustment of the mA and/or kV according to patient size and/or use of iterative reconstruction technique. COMPARISON:  None Available. FINDINGS: Brain: No evidence of acute infarction, hemorrhage, hydrocephalus, extra-axial collection or mass lesion/mass effect. Vascular: No hyperdense vessel or unexpected calcification. Skull: Limited with motion artifact. No definite acute osseous finding. Mastoids and sinuses grossly clear. Sinuses/Orbits: Limited with  motion. Orbits are symmetric. Clear sinuses. Other: None. IMPRESSION: No acute intracranial abnormality by noncontrast CT. Electronically Signed   By: Judie Petit.  Shick M.D.   On: 03/26/2023 17:25   DG Chest Portable 1 View  Result Date: 03/26/2023 CLINICAL DATA:  Tachycardia, palpitations, weakness. Shortness of breath. Syncopal episode. EXAM: PORTABLE CHEST 1 VIEW COMPARISON:  None Available. FINDINGS: The heart is prominent size in this portable AP view. Mediastinal contours are normal. There is no focal airspace disease, pleural effusion, pulmonary edema or pneumothorax. No acute osseous abnormalities. IMPRESSION: Prominent heart size in this portable AP view, cardiomegaly versus technique. No acute pulmonary process. Electronically Signed   By: Narda Rutherford M.D.   On: 03/26/2023 17:22    EKG: Independently reviewed.   Assessment and Plan: * Thyrotoxicosis Severe hyperthyroidism, however, currently no acute symptoms at this time in ED.  Not in thyroid storm at this time. Never the less, will get pt admitted for observation and initiation of treatment given  severity of thyroid labs. Started Tapazole 20mg  Q8H, first dose STAT Started Propranolol 20mg  Q6H first dose given in ED Received single dose of solucortef 100mg  in ED, ill hold off on any more steroid orders for the moment since patient isnt in thyroid storm at this time. Tele monitor 2d echo given BNP elevation Thyroid US results pending. Inpatient endocrine consult not available at [facility], EDP attempted to contact endocrine at [facility], but informed that they dont take night call apparently. Day team will likely either want to try endocrine at [facility] in AM and/or contact endocrine in community for unofficial curbside consult in AM. May end up needing either ablation or surgical treatment.  Syncope Sounds more like near syncope / weakness than actual syncope based on HPI. Tele monitor 2d echo Treatment of hyperthyroidism as above.      Advance Care Planning:   Code Status: Full Code  Consults: None  Family Communication: No family in room  Severity of Illness: The appropriate patient status for this patient is OBSERVATION. Observation status is judged to be reasonable and necessary in order to provide the required intensity of service to ensure the patient's safety. The patient's presenting symptoms, physical exam findings, and initial radiographic and laboratory data in the context of their medical condition is felt to place them at decreased risk for further clinical deterioration. Furthermore, it is anticipated that the patient will be medically stable for discharge from the hospital within 2 midnights of admission.   Author: Hillary Bow., DO 03/26/2023 8:36 PM  For on call review [email or web address].

## 2023-03-26 NOTE — Assessment & Plan Note (Addendum)
Severe hyperthyroidism, however, currently no acute symptoms at this time in ED.  Not in thyroid storm at this time. Never the less, will get pt admitted for observation and initiation of treatment given severity of thyroid labs. Started Tapazole 20mg  Q8H, first dose STAT Started Propranolol 20mg  Q6H first dose given in ED Received single dose of solucortef 100mg  in ED, ill hold off on any more steroid orders for the moment since patient isnt in thyroid storm at this time. Tele monitor 2d echo given BNP elevation Thyroid US results pending. Inpatient endocrine consult not available at [facility], EDP attempted to contact endocrine at [facility], but informed that they dont take night call apparently. Day team will likely either want to try endocrine at [facility] in AM and/or contact endocrine in community for unofficial curbside consult in AM. May end up needing either ablation or surgical treatment.

## 2023-03-26 NOTE — ED Provider Notes (Signed)
[place] EMERGENCY DEPARTMENT AT [facility] Provider Note   CSN: [id number] Arrival date & time: 03/26/23  1416     History  Chief Complaint  Patient presents with   Tachycardia    Tricia Campbell is a 32 y.o. female.  HPI Patient presents for lab abnormalities.  She has no known chronic medical conditions.  She was seen at PCPs office yesterday.  This was her first visit to establish care.  During yesterday's visit, she endorsed recent syncopal episode 4 days ago. She underwent lab work which was notable for hyperthyroidism.  She was advised to come to the ED. currently, patient denies any symptoms.  She states that she has had intermittent palpitations over the past several months.  These have worsened in intensity and frequency over the past several weeks.  She describes her syncopal episode 4 days ago as near syncope, never fully passing out but becoming so weak that she fell to the floor.  She was briefly unable to get up from the floor due to the weakness.  She will have intermittent headaches.  She denies episodes of diaphoresis other than her near syncopal episode 4 days ago.  She will feel flushed at times.  She will feel short of breath at times.  She had a miscarriage of twins in December 2023.  Since then, she has been on birth control.  She will have occasional mild vaginal bleeding.  This has been ongoing since her pregnancy loss.  Other than birth control, she currently does not take any daily medications.  She will take ibuprofen for treatment of her frequent headaches.    Home Medications Prior to Admission medications   Medication Sig Start Date End Date Taking? Authorizing Provider  ibuprofen (ADVIL) 800 MG tablet Take 1 tablet (800 mg total) by mouth every 8 (eight) hours as needed. 12/24/22   Reva Bores, MD      Allergies    Latex    Review of Systems   Review of Systems  Respiratory:  Positive for shortness of breath.   Cardiovascular:   Positive for palpitations.  Neurological:  Positive for syncope and headaches.  All other systems reviewed and are negative.   Physical Exam Updated Vital Signs BP 136/71   Pulse 95   Temp 98.1 F (36.7 C) (Oral)   Resp (!) 21   Ht 5\' 6"  (1.676 m)   Wt 57 kg   SpO2 100%   BMI 20.28 kg/m  Physical Exam Vitals and nursing note reviewed.  Constitutional:      General: She is not in acute distress.    Appearance: Normal appearance. She is well-developed. She is not ill-appearing, toxic-appearing or diaphoretic.  HENT:     Head: Normocephalic and atraumatic.     Right Ear: External ear normal.     Left Ear: External ear normal.     Nose: Nose normal.     Mouth/Throat:     Mouth: Mucous membranes are moist.  Eyes:     Extraocular Movements: Extraocular movements intact.     Conjunctiva/sclera: Conjunctivae normal.  Cardiovascular:     Rate and Rhythm: Normal rate and regular rhythm.     Heart sounds: No murmur heard. Pulmonary:     Effort: Pulmonary effort is normal. No respiratory distress.     Breath sounds: Normal breath sounds. No wheezing or rales.  Abdominal:     General: There is no distension.     Palpations: Abdomen is soft.  Tenderness: There is no abdominal tenderness.  Musculoskeletal:        General: No swelling. Normal range of motion.     Cervical back: Normal range of motion and neck supple.     Right lower leg: No edema.     Left lower leg: No edema.  Skin:    General: Skin is warm and dry.     Coloration: Skin is not jaundiced or pale.  Neurological:     General: No focal deficit present.     Mental Status: She is alert and oriented to person, place, and time.     Cranial Nerves: No cranial nerve deficit.     Sensory: No sensory deficit.     Motor: No weakness.     Coordination: Coordination normal.  Psychiatric:        Mood and Affect: Mood normal.        Behavior: Behavior normal.        Thought Content: Thought content normal.         Judgment: Judgment normal.     ED Results / Procedures / Treatments   Labs (all labs ordered are listed, but only abnormal results are displayed) Labs Reviewed  BASIC METABOLIC PANEL - Abnormal; Notable for the following components:      Result Value   CO2 21 (*)    Creatinine, Ser 0.41 (*)    All other components within normal limits  RAPID URINE DRUG SCREEN, HOSP PERFORMED - Abnormal; Notable for the following components:   Tetrahydrocannabinol POSITIVE (*)    All other components within normal limits  BRAIN NATRIURETIC PEPTIDE - Abnormal; Notable for the following components:   B Natriuretic Peptide 237.1 (*)    All other components within normal limits  MAGNESIUM  HEPATIC FUNCTION PANEL  CBC WITH DIFFERENTIAL/PLATELET  URINALYSIS, ROUTINE W REFLEX MICROSCOPIC  PREGNANCY, URINE  THYROID ANTIBODIES  HIV ANTIBODY (ROUTINE TESTING W REFLEX)  CBC  BASIC METABOLIC PANEL  CBG MONITORING, ED  TROPONIN I (HIGH SENSITIVITY)  TROPONIN I (HIGH SENSITIVITY)    EKG EKG Interpretation Date/Time:  Tuesday March 26 2023 14:44:54 EDT Ventricular Rate:  92 PR Interval:  144 QRS Duration:  88 QT Interval:  358 QTC Calculation: 443 R Axis:   41  Text Interpretation: Sinus rhythm Biatrial enlargement RSR' in V1 or V2, probably normal variant Confirmed by Gloris Manchester ([id number]) on 03/26/2023 3:27:07 PM  Radiology CT Head Wo Contrast  Result Date: 03/26/2023 CLINICAL DATA:  Headache, increasing frequency or severity EXAM: CT HEAD WITHOUT CONTRAST TECHNIQUE: Contiguous axial images were obtained from the base of the skull through the vertex without intravenous contrast. RADIATION DOSE REDUCTION: This exam was performed according to the departmental dose-optimization program which includes automated exposure control, adjustment of the mA and/or kV according to patient size and/or use of iterative reconstruction technique. COMPARISON:  None Available. FINDINGS: Brain: No evidence of acute  infarction, hemorrhage, hydrocephalus, extra-axial collection or mass lesion/mass effect. Vascular: No hyperdense vessel or unexpected calcification. Skull: Limited with motion artifact. No definite acute osseous finding. Mastoids and sinuses grossly clear. Sinuses/Orbits: Limited with motion. Orbits are symmetric. Clear sinuses. Other: None. IMPRESSION: No acute intracranial abnormality by noncontrast CT. Electronically Signed   By: Judie Petit.  Shick M.D.   On: 03/26/2023 17:25   DG Chest Portable 1 View  Result Date: 03/26/2023 CLINICAL DATA:  Tachycardia, palpitations, weakness. Shortness of breath. Syncopal episode. EXAM: PORTABLE CHEST 1 VIEW COMPARISON:  None Available. FINDINGS: The heart is prominent size in  this portable AP view. Mediastinal contours are normal. There is no focal airspace disease, pleural effusion, pulmonary edema or pneumothorax. No acute osseous abnormalities. IMPRESSION: Prominent heart size in this portable AP view, cardiomegaly versus technique. No acute pulmonary process. Electronically Signed   By: Narda Rutherford M.D.   On: 03/26/2023 17:22    Procedures Procedures    Medications Ordered in ED Medications  enoxaparin (LOVENOX) injection 40 mg (has no administration in time range)  acetaminophen (TYLENOL) tablet 650 mg (has no administration in time range)    Or  acetaminophen (TYLENOL) suppository 650 mg (has no administration in time range)  ondansetron (ZOFRAN) tablet 4 mg (has no administration in time range)    Or  ondansetron (ZOFRAN) injection 4 mg (has no administration in time range)  propranolol (INDERAL) tablet 20 mg (has no administration in time range)  methimazole (TAPAZOLE) tablet 20 mg (has no administration in time range)  acetaminophen (TYLENOL) tablet 650 mg (650 mg Oral Given 03/26/23 1838)  hydrocortisone sodium succinate (SOLU-CORTEF) 100 MG injection 100 mg (100 mg Intravenous Given 03/26/23 1946)    ED Course/ Medical Decision Making/ A&P                                  Medical Decision Making Amount and/or Complexity of Data Reviewed Labs: ordered. Radiology: ordered.  Risk OTC drugs. Prescription drug management. Decision regarding hospitalization.   This patient presents to the ED for concern of palpitations, abnormal lab results, this involves an extensive number of treatment options, and is a complaint that carries with it a high risk of complications and morbidity.  The differential diagnosis includes hyperthyroidism, thyrotoxicosis, infection   Co morbidities that complicate the patient evaluation  N/A   Additional history obtained:  Additional history obtained from N/A External records from outside source obtained and reviewed including EMR   Lab Tests:  I Ordered, and personally interpreted labs.  The pertinent results include: Normal hemoglobin, no leukocytosis, normal kidney function, normal electrolytes.  Troponin is normal.  BNP is elevated.   Imaging Studies ordered:  I ordered imaging studies including chest x-ray, CT head, thyroid ultrasound I independently visualized and interpreted imaging which showed there is evidence of possible cardiomegaly on chest x-ray.  No acute findings were identified on CT head.  Ultrasound pending at time of admission. I agree with the radiologist interpretation   Cardiac Monitoring: / EKG:  The patient was maintained on a cardiac monitor.  I personally viewed and interpreted the cardiac monitored which showed an underlying rhythm of: Sinus rhythm  Problem List / ED Course / Critical interventions / Medication management  Patient presenting for recent diagnosis of hypothyroidism.  This is identified at doctor's visit yesterday, where she underwent outpatient lab work.  Recent symptoms have included intermittent palpitations, headaches, shortness of breath, and a near syncopal episode 4 days ago.  Currently, she is asymptomatic.  Vital signs on arrival are notable  for tachycardia and hypertension.  On my assessment, patient had normal heart rate.  She has no focal neurologic deficits.  Breathing is unlabored.  Will obtain lab work.  She has had intermittent vaginal bleeding since her miscarriage 8 months ago.  Will check urine pregnancy.  Urine pregnancy was negative.  Lab work was notable for elevated BNP.  Additionally, there was some evidence of cardiomegaly on chest x-ray.  Patient's heart rate ranged from 90s to 120s while in the  ED.  She does endorse headache and Tylenol was ordered.  CT of head did not show acute findings.  I attempted to consult with endocrinology at [facility].  They called back at 5:10 PM and stated that there specialist had gone home at 5 PM.  Patient was started on therapies for her hyperthyroidism.  Methimazole, propranolol, and hydrocortisone were ordered.  Patient to be admitted for observation and further workup. I ordered medication including Tylenol for headache; Solu-Cortef, propranolol, methimazole for hyperthyroidism Reevaluation of the patient after these medicines showed that the patient improved I have reviewed the patients home medicines and have made adjustments as needed   Social Determinants of Health:  Has PCP         Final Clinical Impression(s) / ED Diagnoses Final diagnoses:  Hyperthyroidism    Rx / DC Orders ED Discharge Orders     None         Gloris Manchester, MD 03/26/23 2031

## 2023-03-26 NOTE — Assessment & Plan Note (Addendum)
Sounds more like near syncope / weakness than actual syncope based on HPI. Tele monitor 2d echo Treatment of hyperthyroidism as above.

## 2023-03-26 NOTE — ED Notes (Signed)
.ED TO INPATIENT HANDOFF REPORT  ED Nurse Name and Phone #: Lyndel Safe Name/Age/Gender Tricia Campbell 32 y.o. female Room/Bed: WA07/WA07  Code Status   Code Status: Full Code  Home/SNF/Other Home Patient oriented to: self, place, time, and situation Is this baseline? Yes   Triage Complete: Triage complete  Chief Complaint Thyrotoxicosis [E05.90]  Triage Note No notes on file   Allergies Allergies  Allergen Reactions   Latex Rash    Level of Care/Admitting Diagnosis ED Disposition     ED Disposition  Admit   Condition  --   Comment  Hospital Area: [facility] [[id number]]  Level of Care: Telemetry [5]  Admit to tele based on following criteria: Complex arrhythmia (Bradycardia/Tachycardia)  May place patient in observation at [facility] or [facility] if equivalent level of care is available:: No  Covid Evaluation: Asymptomatic - no recent exposure (last 10 days) testing not required  Diagnosis: Thyrotoxicosis [161096]  Admitting Physician: Hillary Bow [[id number]]  Attending Physician: Hillary Bow [[id number]]          B Medical/Surgery History Past Medical History:  Diagnosis Date   History of marijuana use 08/17/2016   +UDS at first prenatal visit   Vaginal delivery    Past Surgical History:  Procedure Laterality Date   APPENDECTOMY     LAPAROSCOPIC APPENDECTOMY N/A 07/23/2018   Procedure: APPENDECTOMY LAPAROSCOPIC;  Surgeon: Ovidio Kin, MD;  Location: [facility];  Service: General;  Laterality: N/A;   WISDOM TOOTH EXTRACTION       A IV Location/Drains/Wounds Patient Lines/Drains/Airways Status     Active Line/Drains/Airways     Name Placement date Placement time Site Days   Peripheral IV 03/26/23 Anterior;Left;Proximal Forearm 03/26/23  1847  Forearm  less than 1   Incision - 3 Ports Abdomen 1: Umbilicus 2: Lateral;Right;Upper 3: Left;Lower;Lateral 07/23/18  0052  -- 1707            Intake/Output Last  24 hours No intake or output data in the 24 hours ending 03/26/23 2031  Labs/Imaging Results for orders placed or performed during the hospital encounter of 03/26/23 (from the past 48 hour(s))  Basic metabolic panel     Status: Abnormal   Collection Time: 03/26/23  3:45 PM  Result Value Ref Range   Sodium 139 135 - 145 mmol/L   Potassium 4.1 3.5 - 5.1 mmol/L   Chloride 107 98 - 111 mmol/L   CO2 21 (L) 22 - 32 mmol/L   Glucose, Bld 90 70 - 99 mg/dL    Comment: Glucose reference range applies only to samples taken after fasting for at least 8 hours.   BUN 7 6 - 20 mg/dL   Creatinine, Ser 0.98 (L) 0.44 - 1.00 mg/dL   Calcium 9.4 8.9 - 11.9 mg/dL   GFR, Estimated >14 >78 mL/min    Comment: (NOTE) Calculated using the CKD-EPI Creatinine Equation (2021)    Anion gap 11 5 - 15    Comment: Performed at [facility], [address] [place], [place] [address]  Troponin I (High Sensitivity)     Status: None   Collection Time: 03/26/23  3:45 PM  Result Value Ref Range   Troponin I (High Sensitivity) 3 <18 ng/L    Comment: (NOTE) Elevated high sensitivity troponin I (hsTnI) values and significant  changes across serial measurements may suggest ACS but many other  chronic and acute conditions are known to elevate hsTnI results.  Refer  to the "Links" section for chest pain algorithms and additional  guidance. Performed at [facility], [address], [place], [place] [address]   Magnesium     Status: None   Collection Time: 03/26/23  3:45 PM  Result Value Ref Range   Magnesium 2.1 1.7 - 2.4 mg/dL    Comment: Performed at [facility], [address], [place], [place] [address]  Hepatic function panel     Status: None   Collection Time: 03/26/23  3:45 PM  Result Value Ref Range   Total Protein 7.5 6.5 - 8.1 g/dL   Albumin 3.7 3.5 - 5.0 g/dL   AST 23 15 - 41 U/L   ALT 17 0 - 44 U/L   Alkaline Phosphatase 113 38 - 126 U/L    Total Bilirubin 0.7 0.3 - 1.2 mg/dL   Bilirubin, Direct 0.2 0.0 - 0.2 mg/dL   Indirect Bilirubin 0.5 0.3 - 0.9 mg/dL    Comment: Performed at [facility], [address], [place], [place] [address]  Rapid urine drug screen (hospital performed)     Status: Abnormal   Collection Time: 03/26/23  3:45 PM  Result Value Ref Range   Opiates NONE DETECTED NONE DETECTED   Cocaine NONE DETECTED NONE DETECTED   Benzodiazepines NONE DETECTED NONE DETECTED   Amphetamines NONE DETECTED NONE DETECTED   Tetrahydrocannabinol POSITIVE (A) NONE DETECTED   Barbiturates NONE DETECTED NONE DETECTED    Comment: (NOTE) DRUG SCREEN FOR MEDICAL PURPOSES ONLY.  IF CONFIRMATION IS NEEDED FOR ANY PURPOSE, NOTIFY LAB WITHIN 5 DAYS.  LOWEST DETECTABLE LIMITS FOR URINE DRUG SCREEN Drug Class                     Cutoff (ng/mL) Amphetamine and metabolites    1000 Barbiturate and metabolites    200 Benzodiazepine                 200 Opiates and metabolites        300 Cocaine and metabolites        300 THC                            50 Performed at [facility], [address] [place], [place] [address]   CBC with Differential/Platelet     Status: None   Collection Time: 03/26/23  3:45 PM  Result Value Ref Range   WBC 6.9 4.0 - 10.5 K/uL   RBC 4.60 3.87 - 5.11 MIL/uL   Hemoglobin 12.3 12.0 - 15.0 g/dL   HCT 52.8 41.3 - 24.4 %   MCV 84.6 80.0 - 100.0 fL   MCH 26.7 26.0 - 34.0 pg   MCHC 31.6 30.0 - 36.0 g/dL   RDW 01.0 27.2 - 53.6 %   Platelets 255 150 - 400 K/uL   nRBC 0.0 0.0 - 0.2 %   Neutrophils Relative % 55 %   Neutro Abs 3.8 1.7 - 7.7 K/uL   Lymphocytes Relative 32 %   Lymphs Abs 2.2 0.7 - 4.0 K/uL   Monocytes Relative 12 %   Monocytes Absolute 0.8 0.1 - 1.0 K/uL   Eosinophils Relative 1 %   Eosinophils Absolute 0.1 0.0 - 0.5 K/uL   Basophils Relative 0 %   Basophils Absolute 0.0 0.0 - 0.1 K/uL   Immature Granulocytes 0 %   Abs Immature Granulocytes  0.01 0.00 - 0.07 K/uL  Comment: Performed at [facility], [address], [place], [place] [address]  Urinalysis, Routine w reflex microscopic -Urine, Clean Catch     Status: None   Collection Time: 03/26/23  3:45 PM  Result Value Ref Range   Color, Urine YELLOW YELLOW   APPearance CLEAR CLEAR   Specific Gravity, Urine 1.014 1.005 - 1.030   pH 7.0 5.0 - 8.0   Glucose, UA NEGATIVE NEGATIVE mg/dL   Hgb urine dipstick NEGATIVE NEGATIVE   Bilirubin Urine NEGATIVE NEGATIVE   Ketones, ur NEGATIVE NEGATIVE mg/dL   Protein, ur NEGATIVE NEGATIVE mg/dL   Nitrite NEGATIVE NEGATIVE   Leukocytes,Ua NEGATIVE NEGATIVE    Comment: Performed at [facility], [address] [place], [place] [address]  Pregnancy, urine     Status: None   Collection Time: 03/26/23  3:45 PM  Result Value Ref Range   Preg Test, Ur NEGATIVE NEGATIVE    Comment:        THE SENSITIVITY OF THIS METHODOLOGY IS >25 mIU/mL. Performed at [facility], [address], [place], [place] [address]   Brain natriuretic peptide     Status: Abnormal   Collection Time: 03/26/23  3:45 PM  Result Value Ref Range   B Natriuretic Peptide 237.1 (H) 0.0 - 100.0 pg/mL    Comment: Performed at [facility], [address], [place], [place] [address]  Troponin I (High Sensitivity)     Status: None   Collection Time: 03/26/23  6:30 PM  Result Value Ref Range   Troponin I (High Sensitivity) 4 <18 ng/L    Comment: (NOTE) Elevated high sensitivity troponin I (hsTnI) values and significant  changes across serial measurements may suggest ACS but many other  chronic and acute conditions are known to elevate hsTnI results.  Refer to the "Links" section for chest pain algorithms and additional  guidance. Performed at [facility], [address], [place], [place] [address]    CT Head Wo Contrast  Result Date: 03/26/2023 CLINICAL DATA:  Headache,  increasing frequency or severity EXAM: CT HEAD WITHOUT CONTRAST TECHNIQUE: Contiguous axial images were obtained from the base of the skull through the vertex without intravenous contrast. RADIATION DOSE REDUCTION: This exam was performed according to the departmental dose-optimization program which includes automated exposure control, adjustment of the mA and/or kV according to patient size and/or use of iterative reconstruction technique. COMPARISON:  None Available. FINDINGS: Brain: No evidence of acute infarction, hemorrhage, hydrocephalus, extra-axial collection or mass lesion/mass effect. Vascular: No hyperdense vessel or unexpected calcification. Skull: Limited with motion artifact. No definite acute osseous finding. Mastoids and sinuses grossly clear. Sinuses/Orbits: Limited with motion. Orbits are symmetric. Clear sinuses. Other: None. IMPRESSION: No acute intracranial abnormality by noncontrast CT. Electronically Signed   By: Judie Petit.  Shick M.D.   On: 03/26/2023 17:25   DG Chest Portable 1 View  Result Date: 03/26/2023 CLINICAL DATA:  Tachycardia, palpitations, weakness. Shortness of breath. Syncopal episode. EXAM: PORTABLE CHEST 1 VIEW COMPARISON:  None Available. FINDINGS: The heart is prominent size in this portable AP view. Mediastinal contours are normal. There is no focal airspace disease, pleural effusion, pulmonary edema or pneumothorax. No acute osseous abnormalities. IMPRESSION: Prominent heart size in this portable AP view, cardiomegaly versus technique. No acute pulmonary process. Electronically Signed   By: Narda Rutherford M.D.   On: 03/26/2023 17:22    Pending Labs [employer] (From admission, onward)     Start  Ordered   03/27/23 0500  CBC  Tomorrow morning,   R        03/26/23 2010   03/27/23 0500  Basic metabolic panel  Tomorrow morning,   R        03/26/23 2010   03/26/23 1958  HIV Antibody (routine testing w rflx)  (HIV Antibody (Routine testing w reflex) panel)  Once,    R        03/26/23 2010   03/26/23 1915  Thyroid antibodies  Once,   R        03/26/23 1915            Vitals/Pain Today's Vitals   03/26/23 1430 03/26/23 1845 03/26/23 1851 03/26/23 1854  BP: (!) 157/146 136/71    Pulse: 92 95    Resp:  (!) 21    Temp:    98.1 F (36.7 C)  TempSrc:    Oral  SpO2: 100% 100%    Weight:      Height:      PainSc:   3      Isolation Precautions No active isolations  Medications Medications  enoxaparin (LOVENOX) injection 40 mg (has no administration in time range)  acetaminophen (TYLENOL) tablet 650 mg (has no administration in time range)    Or  acetaminophen (TYLENOL) suppository 650 mg (has no administration in time range)  ondansetron (ZOFRAN) tablet 4 mg (has no administration in time range)    Or  ondansetron (ZOFRAN) injection 4 mg (has no administration in time range)  propranolol (INDERAL) tablet 20 mg (has no administration in time range)  methimazole (TAPAZOLE) tablet 20 mg (has no administration in time range)  acetaminophen (TYLENOL) tablet 650 mg (650 mg Oral Given 03/26/23 1838)  hydrocortisone sodium succinate (SOLU-CORTEF) 100 MG injection 100 mg (100 mg Intravenous Given 03/26/23 1946)    Mobility walks     Focused Assessments     R Recommendations: See Admitting Provider Note  Report given to:   Additional Notes:

## 2023-03-27 ENCOUNTER — Observation Stay (HOSPITAL_COMMUNITY): Payer: Medicaid Other

## 2023-03-27 DIAGNOSIS — I119 Hypertensive heart disease without heart failure: Secondary | ICD-10-CM | POA: Diagnosis present

## 2023-03-27 DIAGNOSIS — Z87891 Personal history of nicotine dependence: Secondary | ICD-10-CM | POA: Diagnosis not present

## 2023-03-27 DIAGNOSIS — E872 Acidosis, unspecified: Secondary | ICD-10-CM | POA: Diagnosis present

## 2023-03-27 DIAGNOSIS — R55 Syncope and collapse: Secondary | ICD-10-CM | POA: Diagnosis present

## 2023-03-27 DIAGNOSIS — E039 Hypothyroidism, unspecified: Secondary | ICD-10-CM | POA: Diagnosis present

## 2023-03-27 DIAGNOSIS — Z91040 Latex allergy status: Secondary | ICD-10-CM | POA: Diagnosis not present

## 2023-03-27 DIAGNOSIS — Z91048 Other nonmedicinal substance allergy status: Secondary | ICD-10-CM | POA: Diagnosis not present

## 2023-03-27 DIAGNOSIS — E0590 Thyrotoxicosis, unspecified without thyrotoxic crisis or storm: Secondary | ICD-10-CM | POA: Diagnosis present

## 2023-03-27 DIAGNOSIS — E0500 Thyrotoxicosis with diffuse goiter without thyrotoxic crisis or storm: Secondary | ICD-10-CM | POA: Diagnosis present

## 2023-03-27 LAB — T4, FREE: Free T4: 5.44 ng/dL — ABNORMAL HIGH (ref 0.61–1.12)

## 2023-03-27 LAB — ECHOCARDIOGRAM COMPLETE
Area-P 1/2: 3.66 cm2
Height: 66 in
S' Lateral: 3.4 cm
Weight: 2010.6 [oz_av]

## 2023-03-27 LAB — HIV ANTIBODY (ROUTINE TESTING W REFLEX): HIV Screen 4th Generation wRfx: NONREACTIVE

## 2023-03-27 NOTE — Progress Notes (Signed)
[facility] Progress Note  Patient: Tricia Campbell    MWU:[id number]  DOA: 03/26/2023     Date of Service: the patient was seen and examined on 03/27/2023  Chief Complaint  Patient presents with   Tachycardia   Brief hospital course: Tricia Campbell is a 32 y.o. female with medical history significant of no chronic medical conditions.   Pt seen at PCP office yesterday to establish care after a syncopal episode 4 days prior.  Lab work at PCPs office showed severe hyperthyroidism.  Pt called by PCP and sent in to ED given severity of hyperthyroidism on lab work.   Currently patient is asymptomatic.  She states that she has had intermittent palpitations over the past several months. These have worsened in intensity and frequency over the past several weeks. She describes her syncopal episode 4 days ago as near syncope, never fully passing out but becoming so weak that she fell to the floor. She was briefly unable to get up from the floor due to the weakness.   She will have intermittent headaches.  She denies episodes of diaphoresis other than her near syncopal episode 4 days ago.  She will feel flushed at times.  She will feel short of breath at times.  She had a miscarriage of twins in December 2023.  Since then, she has been on birth control.  She will have occasional mild vaginal bleeding.  This has been ongoing since her pregnancy loss.  Other than birth control, she currently does not take any daily medications.  She will take ibuprofen for treatment of her frequent headaches.    Assessment and Plan: Thyrotoxicosis Severe hyperthyroidism, however, currently no acute symptoms Not in thyroid storm at this time. Started Tapazole 20mg  Q8H, first dose STAT Started Propranolol 20mg  Q6H first dose given in ED Received single dose of solucortef 100mg  in ED, ill hold off on any more steroid orders for the moment since patient isnt in thyroid storm at this time. Tele  monitor 2d echo given BNP elevation Thyroid US: Consistent with Graves' disease 8/21 d/w endocrinologist Dr. Sharl Ma, recommended to continue current treatment and consolidate methimazole 60 mg p.o. daily and long-acting propranolol or atenolol on discharge.  Follow-up as an outpatient.    Syncope Sounds more like near syncope / weakness than actual syncope based on HPI. Tele monitor 2d echo Treatment of hyperthyroidism as above.   Body mass index is 20.28 kg/m.  Interventions:  Diet: Regular diet DVT Prophylaxis: Patient refused Lovenox, at low risk  Advance goals of care discussion: Full code  Family Communication: family was present at bedside, at the time of interview.  The pt provided permission to discuss medical plan with the family. Opportunity was given to ask question and all questions were answered satisfactorily.   Disposition:  Pt is from home, admitted with syncope and hypothyroid, still has elevated free T4, which precludes a safe discharge. Discharge to home, when most likely tomorrow a.m.  Subjective: No significant events overnight, patient denies any chest pain or palpitations at this time. Free T4 level is still elevated, we will continue to monitor and continue current treatment, echocardiogram pending Discharge plan tomorrow a.m. if remains stable.    Physical Exam: General: NAD, lying comfortably Appear in no distress, affect appropriate Eyes: PERRLA ENT: Oral Mucosa Clear, moist  Neck: no JVD,  Cardiovascular: S1 and S2 Present, no Murmur,  Respiratory: good respiratory effort, Bilateral Air entry equal and Decreased, no Crackles, no wheezes Abdomen: Bowel Sound present, Soft  and no tenderness,  Skin: no rashes Extremities: no Pedal edema, no calf tenderness Neurologic: without any new focal findings Gait not checked due to patient safety concerns  Vitals:   03/27/23 0508 03/27/23 0826 03/27/23 0945 03/27/23 1336  BP: 125/76 130/80 129/66 130/79   Pulse: 87 82 91 85  Resp: 18  18 18   Temp: 98 F (36.7 C)  98 F (36.7 C)   TempSrc: Oral     SpO2: 100%  100% 100%  Weight:      Height:        Intake/Output Summary (Last 24 hours) at 03/27/2023 1645 Last data filed at 03/27/2023 1003 Gross per 24 hour  Intake 466 ml  Output --  Net 466 ml   Filed Weights   03/26/23 1428  Weight: 57 kg    Data Reviewed: I have personally reviewed and interpreted daily labs, tele strips, imagings as discussed above. I reviewed all nursing notes, pharmacy notes, vitals, pertinent old records I have discussed plan of care as described above with RN and patient/family.  CBC: Recent Labs  Lab 03/25/23 1610 03/26/23 1545  WBC 6.1 6.9  NEUTROABS 3.4 3.8  HGB 12.5 12.3  HCT 38.0 38.9  MCV 82 84.6  PLT 268 255   Basic Metabolic Panel: Recent Labs  Lab 03/25/23 1610 03/26/23 1545  NA 141 139  K 5.7* 4.1  CL 107* 107  CO2 21 21*  GLUCOSE 113* 90  BUN 10 7  CREATININE 0.64 0.41*  CALCIUM 10.1 9.4  MG 1.8 2.1    Studies: US THYROID  Result Date: 03/27/2023 CLINICAL DATA:  Other.  Palpitations EXAM: THYROID ULTRASOUND TECHNIQUE: Ultrasound examination of the thyroid gland and adjacent soft tissues was performed. COMPARISON:  None Available. FINDINGS: Parenchymal Echotexture: Moderately heterogenous Isthmus: 0.8 cm Right lobe: 6.9 x 2.5 x 2.4 cm Left lobe: 6.9 x 2.3 x 2.7 cm _________________________________________________________ Estimated total number of nodules >/= 1 cm: 0 Number of spongiform nodules >/=  2 cm not described below (TR1): 0 Number of mixed cystic and solid nodules >/= 1.5 cm not described below (TR2): 0 _________________________________________________________ Enlarged diffusely heterogeneous thyroid gland with hypervascularity on color Doppler imaging. No discrete thyroid nodules. IMPRESSION: 1. Enlarged, heterogeneous and hypervascular thyroid gland. Differential considerations include Graves disease (in the setting  of hyperthyroidism) and active thyroiditis. 2. No thyroid nodules. Electronically Signed   By: Malachy Moan M.D.   On: 03/27/2023 07:01    Scheduled Meds:  enoxaparin (LOVENOX) injection  40 mg Subcutaneous Q24H   methIMAzole  20 mg Oral Q8H   propranolol  20 mg Oral Q6H   Continuous Infusions: PRN Meds: acetaminophen **OR** acetaminophen, ondansetron **OR** ondansetron (ZOFRAN) IV  Time spent: 35 minutes  Author: Gillis Santa. MD [facility] 03/27/2023 4:45 PM  To reach On-call, see care teams to locate the attending and reach out to them via [email or web address]. If 7PM-7AM, please contact night-coverage If you still have difficulty reaching the attending provider, please page the [facility] (Director on Call) for [facility] on amion for assistance.

## 2023-03-27 NOTE — Plan of Care (Signed)
  Problem: Education: Goal: Knowledge of General Education information will improve Description: Including pain rating scale, medication(s)/side effects and non-pharmacologic comfort measures Outcome: Progressing   Problem: Clinical Measurements: Goal: Respiratory complications will improve Outcome: Progressing   Problem: Clinical Measurements: Goal: Cardiovascular complication will be avoided Outcome: Progressing   

## 2023-03-28 DIAGNOSIS — E0590 Thyrotoxicosis, unspecified without thyrotoxic crisis or storm: Secondary | ICD-10-CM | POA: Diagnosis not present

## 2023-03-28 LAB — CBC
HCT: 50.1 % — ABNORMAL HIGH (ref 36.0–46.0)
Hemoglobin: 15.1 g/dL — ABNORMAL HIGH (ref 12.0–15.0)
MCH: 27 pg (ref 26.0–34.0)
MCHC: 30.1 g/dL (ref 30.0–36.0)
MCV: 89.6 fL (ref 80.0–100.0)
Platelets: 206 10*3/uL (ref 150–400)
RBC: 5.59 MIL/uL — ABNORMAL HIGH (ref 3.87–5.11)
RDW: 13.7 % (ref 11.5–15.5)
WBC: 6 10*3/uL (ref 4.0–10.5)
nRBC: 0 % (ref 0.0–0.2)

## 2023-03-28 LAB — BASIC METABOLIC PANEL
Anion gap: 13 (ref 5–15)
BUN: 12 mg/dL (ref 6–20)
CO2: 18 mmol/L — ABNORMAL LOW (ref 22–32)
Calcium: 9.9 mg/dL (ref 8.9–10.3)
Chloride: 104 mmol/L (ref 98–111)
Creatinine, Ser: 0.58 mg/dL (ref 0.44–1.00)
GFR, Estimated: >60 mL/min (ref >60)
Glucose, Bld: 94 mg/dL (ref 70–99)
Potassium: 3.9 mmol/L (ref 3.5–5.1)
Sodium: 135 mmol/L (ref 135–145)

## 2023-03-28 LAB — T4, FREE: Free T4: 4.98 ng/dL — ABNORMAL HIGH (ref 0.61–1.12)

## 2023-03-28 LAB — PHOSPHORUS: Phosphorus: 5.9 mg/dL — ABNORMAL HIGH (ref 2.5–4.6)

## 2023-03-28 LAB — MAGNESIUM: Magnesium: 2.4 mg/dL (ref 1.7–2.4)

## 2023-03-28 MED ORDER — IBUPROFEN 400 MG PO TABS
800.0000 mg | ORAL_TABLET | Freq: Three times a day (TID) | ORAL | Status: DC | PRN
Start: 2023-03-28 — End: 2024-03-06

## 2023-03-28 MED ORDER — PROPRANOLOL HCL 40 MG PO TABS: 40.0000 mg | ORAL_TABLET | Freq: Two times a day (BID) | ORAL | 5 refills | Status: DC

## 2023-03-28 MED ORDER — PROPRANOLOL HCL ER 80 MG PO CP24
80.0000 mg | ORAL_CAPSULE | Freq: Every day | ORAL | Status: DC
  Filled 2023-03-28: qty 1

## 2023-03-28 MED ORDER — METHIMAZOLE 10 MG PO TABS: 60.0000 mg | ORAL_TABLET | Freq: Every day | ORAL | Status: DC

## 2023-03-28 MED ORDER — PROPRANOLOL HCL 20 MG PO TABS: 40.0000 mg | ORAL_TABLET | Freq: Two times a day (BID) | ORAL | Status: DC

## 2023-03-28 MED ORDER — METHIMAZOLE 10 MG PO TABS: 60.0000 mg | ORAL_TABLET | Freq: Every day | ORAL | 3 refills | Status: DC

## 2023-03-28 NOTE — Discharge Summary (Signed)
[facility] Discharge Summary   Patient: Tricia Campbell OVF:[id number]  PCP: Hilbert Bible, FNP  Date of admission: 03/26/2023   Date of discharge:  03/28/2023     Discharge Diagnoses:  Principal Problem:   Thyrotoxicosis Active Problems:   Syncope   Admitted From: Home Disposition:  Home   Recommendations for Outpatient Follow-up:  PCP: In 1 week Follow-up with endocrinologist in 1 week. Follow up LABS/TEST: Free T4 level, BMP after 1 week   Follow-up Information     Talmage Coin, MD Follow up in 1 week(s).   Specialty: Endocrinology Contact information: [address] [place] [address] [phone]         Hilbert Bible, FNP Follow up in 1 week(s).   Specialty: Family Medicine Contact information: [address] [place] [address] [phone]                Diet recommendation: Regular diet  Activity: The patient is advised to gradually reintroduce usual activities, as tolerated  Discharge Condition: stable  Code Status: Full code   History of present illness: As per the H and P dictated on admission.  Hospital Course:  Tricia Campbell is a 32 y.o. female with medical history significant of no chronic medical conditions. Pt seen at PCP office yesterday to establish care after a syncopal episode 4 days prior.  Lab work at PCPs office showed severe hyperthyroidism.  Pt called by PCP and sent in to ED given severity of hyperthyroidism on lab work.  Currently patient is asymptomatic.  She states that she has had intermittent palpitations over the past several months. These have worsened in intensity and frequency over the past several weeks. She describes her syncopal episode 4 days ago as near syncope, never fully passing out but becoming so weak that she fell to the floor. She was briefly unable to get up from the floor due to the weakness. She will have intermittent  headaches.  She denies episodes of diaphoresis other than her near syncopal episode 4 days ago.  She was feeling flushed and SOB at times. She had a miscarriage of twins in December 2023.  Since then, she has been on birth control.  She will have occasional mild vaginal bleeding.  This has been ongoing since her pregnancy loss.  Other than birth control, and ibuprofen for her frequent headaches, she does not take any daily medications.    Assessment and Plan: # Thyrotoxicosis: Severe hyperthyroidism, however, currently no acute symptoms. Not in thyroid storm at this time. Started Tapazole 20mg  Q8H, first dose STAT and Started Propranolol 20mg  Q6H first dose given in ED. Received single dose of solucortef 100mg  in ED, ill hold off on any more steroid orders for the moment since patient isnt in thyroid storm at this time. S/p Tele monitor but patient refused.  TTE shows LVEF 55 to 60%, no any other significant findings.  BNP 237 slightly elevated, troponin negative x 2. Thyroid US: Consistent with Graves' disease. On 8/21 d/w endocrinologist Dr. Sharl Ma, recommended to continue current treatment and consolidate methimazole 60 mg p.o. daily and long-acting propranolol or atenolol on discharge.  Follow-up as an outpatient.  Patient remained asymptomatic, free T4 level 7.7-->5.44-->4.98 gradually trending down.  Thyroglobulin antibodies 2.3 and thyroid peroxidase antibodies 64 elevated.  Patient was discharged on methimazole 60 mg p.o. daily and propranolol 40 mg p.o. twice daily with holding parameters.  Patient was advised to monitor BP at home.  Follow-up with PCP  and endocrine as an outpatient in 1 week. # Syncope: Sounds more like near syncope / weakness than actual syncope based on HPI.  Monitored on telemetry but patient refused, 2D echocardiogram done, no any significant findings.  Patient remained asymptomatic during hospital stay.  Hypothyroidism was treated as above.   # Metabolic acidosis mild, CO2 21-18,  unknown cause.  Follow with PCP to repeat BMP after 1 week.  Body mass index is 20.28 kg/m.  Nutrition Interventions:  Patient was ambulatory without any assistance. On the day of the discharge the patient's vitals were stable, and no other acute medical condition were reported by patient. the patient was felt safe to be discharge at Home.  Consultants: d/w endocrinologist Dr. Sharl Ma over the phone Procedures: none  Discharge Exam: General: Appear in no distress, no Rash; Oral Mucosa Clear, moist. Cardiovascular: S1 and S2 Present, no Murmur, Respiratory: normal respiratory effort, Bilateral Air entry present and no Crackles, no wheezes Abdomen: Bowel Sound present, Soft and no tenderness, no hernia Extremities: no Pedal edema, no calf tenderness Neurology: alert and oriented to time, place, and person affect appropriate.  Filed Weights   03/26/23 1428  Weight: 57 kg   Vitals:   03/28/23 1040 03/28/23 1133  BP: 128/82 132/88  Pulse: 95 (!) 101  Resp:    Temp: 98.2 F (36.8 C) (!) 97.4 F (36.3 C)  SpO2: 100% 99%    DISCHARGE MEDICATION: Allergies as of 03/28/2023       Reactions   Tape Other (See Comments)   Irritates the skin   Latex Rash        Medication List     TAKE these medications    ibuprofen 400 MG tablet Commonly known as: ADVIL Take 2 tablets (800 mg total) by mouth every 8 (eight) hours as needed for headache (or pain). What changed:  medication strength reasons to take this   methimazole 10 MG tablet Commonly known as: TAPAZOLE Take 6 tablets (60 mg total) by mouth daily. Start taking on: March 29, 2023   propranolol 40 MG tablet Commonly known as: INDERAL Take 1 tablet (40 mg total) by mouth 2 (two) times daily. Hold if SBP <130       Allergies  Allergen Reactions   Tape Other (See Comments)    Irritates the skin   Latex Rash   Discharge Instructions     Call MD for:  difficulty breathing, headache or visual disturbances    Complete by: As directed    Call MD for:  extreme fatigue   Complete by: As directed    Call MD for:  persistant dizziness or light-headedness   Complete by: As directed    Call MD for:  persistant nausea and vomiting   Complete by: As directed    Call MD for:  severe uncontrolled pain   Complete by: As directed    Call MD for:  temperature >100.4   Complete by: As directed    Diet - low sodium heart healthy   Complete by: As directed    Discharge instructions   Complete by: As directed    Follow-up with PCP in 1 week Follow-up with endocrinologist in 1 week Monitor BP and skip the dose of propranolol if systolic BP less than 130 mmHg   Increase activity slowly   Complete by: As directed        The results of significant diagnostics from this hospitalization (including imaging, microbiology, ancillary and laboratory) are listed below for reference.  Significant Diagnostic Studies: ECHOCARDIOGRAM COMPLETE  Result Date: 03/27/2023    ECHOCARDIOGRAM REPORT   Patient Name:   Tricia Campbell Date of Exam: 03/27/2023 Medical Rec #:  [id number]               Height:       66.0 in Accession #:    [id number]              Weight:       125.7 lb Date of Birth:  1991/03/30               BSA:          1.641 m Patient Age:    32 years                BP:           129/66 mmHg Patient Gender: F                       HR:           76 bpm. Exam Location:  Inpatient Procedure: 2D Echo, Cardiac Doppler and Color Doppler Indications:    CHF Acute Diastolic I50.31  History:        Patient has no prior history of Echocardiogram examinations.  Sonographer:    Harriette Bouillon RDCS Referring Phys: [phone] JARED M GARDNER IMPRESSIONS  1. Left ventricular ejection fraction, by estimation, is 55 to 60%. The left ventricle has normal function. The left ventricle has no regional wall motion abnormalities. Left ventricular diastolic parameters were normal.  2. Right ventricular systolic function is normal. The  right ventricular size is normal. Tricuspid regurgitation signal is inadequate for assessing PA pressure.  3. The mitral valve is grossly normal. Trivial mitral valve regurgitation. No evidence of mitral stenosis.  4. The aortic valve is tricuspid. Aortic valve regurgitation is not visualized. No aortic stenosis is present.  5. The inferior vena cava is normal in size with greater than 50% respiratory variability, suggesting right atrial pressure of 3 mmHg. Conclusion(s)/Recommendation(s): Normal biventricular function without evidence of hemodynamically significant valvular heart disease. FINDINGS  Left Ventricle: Left ventricular ejection fraction, by estimation, is 55 to 60%. The left ventricle has normal function. The left ventricle has no regional wall motion abnormalities. The left ventricular internal cavity size was normal in size. There is  no left ventricular hypertrophy. Left ventricular diastolic parameters were normal. Right Ventricle: The right ventricular size is normal. No increase in right ventricular wall thickness. Right ventricular systolic function is normal. Tricuspid regurgitation signal is inadequate for assessing PA pressure. Left Atrium: Left atrial size was normal in size. Right Atrium: Right atrial size was normal in size. Pericardium: There is no evidence of pericardial effusion. Mitral Valve: The mitral valve is grossly normal. Trivial mitral valve regurgitation. No evidence of mitral valve stenosis. Tricuspid Valve: The tricuspid valve is grossly normal. Tricuspid valve regurgitation is not demonstrated. No evidence of tricuspid stenosis. Aortic Valve: The aortic valve is tricuspid. Aortic valve regurgitation is not visualized. No aortic stenosis is present. Pulmonic Valve: The pulmonic valve was grossly normal. Pulmonic valve regurgitation is not visualized. No evidence of pulmonic stenosis. Aorta: The aortic root and ascending aorta are structurally normal, with no evidence of  dilitation. Venous: The right lower pulmonary vein is normal. The inferior vena cava is normal in size with greater than 50% respiratory variability, suggesting right atrial pressure of 3 mmHg. IAS/Shunts: The atrial septum is grossly normal.  LEFT VENTRICLE PLAX 2D LVIDd:         4.90 cm   Diastology LVIDs:         3.40 cm   LV e' medial:    10.20 cm/s LV PW:         0.90 cm   LV E/e' medial:  8.7 LV IVS:        0.90 cm   LV e' lateral:   17.20 cm/s LVOT diam:     2.10 cm   LV E/e' lateral: 5.2 LV SV:         70 LV SV Index:   43 LVOT Area:     3.46 cm  RIGHT VENTRICLE             IVC RV S prime:     13.80 cm/s  IVC diam: 1.80 cm TAPSE (M-mode): 2.3 cm LEFT ATRIUM             Index        RIGHT ATRIUM           Index LA diam:        2.50 cm 1.52 cm/m   RA Area:     13.10 cm LA Vol (A2C):   25.1 ml 15.29 ml/m  RA Volume:   35.00 ml  21.32 ml/m LA Vol (A4C):   36.4 ml 22.18 ml/m LA Biplane Vol: 33.3 ml 20.29 ml/m  AORTIC VALVE LVOT Vmax:   136.00 cm/s LVOT Vmean:  95.400 cm/s LVOT VTI:    0.203 m  AORTA Ao Root diam: 2.90 cm Ao Asc diam:  2.90 cm MITRAL VALVE MV Area (PHT): 3.66 cm    SHUNTS MV Decel Time: 207 msec    Systemic VTI:  0.20 m MV E velocity: 89.20 cm/s  Systemic Diam: 2.10 cm MV A velocity: 62.40 cm/s MV E/A ratio:  1.43 Lennie Odor MD Electronically signed by Lennie Odor MD Signature Date/Time: 03/27/2023/6:07:50 PM    Final    US THYROID  Result Date: 03/27/2023 CLINICAL DATA:  Other.  Palpitations EXAM: THYROID ULTRASOUND TECHNIQUE: Ultrasound examination of the thyroid gland and adjacent soft tissues was performed. COMPARISON:  None Available. FINDINGS: Parenchymal Echotexture: Moderately heterogenous Isthmus: 0.8 cm Right lobe: 6.9 x 2.5 x 2.4 cm Left lobe: 6.9 x 2.3 x 2.7 cm _________________________________________________________ Estimated total number of nodules >/= 1 cm: 0 Number of spongiform nodules >/=  2 cm not described below (TR1): 0 Number of mixed cystic and solid nodules  >/= 1.5 cm not described below (TR2): 0 _________________________________________________________ Enlarged diffusely heterogeneous thyroid gland with hypervascularity on color Doppler imaging. No discrete thyroid nodules. IMPRESSION: 1. Enlarged, heterogeneous and hypervascular thyroid gland. Differential considerations include Graves disease (in the setting of hyperthyroidism) and active thyroiditis. 2. No thyroid nodules. Electronically Signed   By: Malachy Moan M.D.   On: 03/27/2023 07:01   CT Head Wo Contrast  Result Date: 03/26/2023 CLINICAL DATA:  Headache, increasing frequency or severity EXAM: CT HEAD WITHOUT CONTRAST TECHNIQUE: Contiguous axial images were obtained from the base of the skull through the vertex without intravenous contrast. RADIATION DOSE REDUCTION: This exam was performed according to the departmental dose-optimization program which includes automated exposure control, adjustment of the mA and/or kV according to patient size and/or use of iterative reconstruction technique. COMPARISON:  None Available. FINDINGS: Brain: No evidence of acute infarction, hemorrhage, hydrocephalus, extra-axial collection or mass lesion/mass effect. Vascular: No hyperdense vessel or unexpected calcification. Skull: Limited with motion artifact. No definite  acute osseous finding. Mastoids and sinuses grossly clear. Sinuses/Orbits: Limited with motion. Orbits are symmetric. Clear sinuses. Other: None. IMPRESSION: No acute intracranial abnormality by noncontrast CT. Electronically Signed   By: Judie Petit.  Shick M.D.   On: 03/26/2023 17:25   DG Chest Portable 1 View  Result Date: 03/26/2023 CLINICAL DATA:  Tachycardia, palpitations, weakness. Shortness of breath. Syncopal episode. EXAM: PORTABLE CHEST 1 VIEW COMPARISON:  None Available. FINDINGS: The heart is prominent size in this portable AP view. Mediastinal contours are normal. There is no focal airspace disease, pleural effusion, pulmonary edema or  pneumothorax. No acute osseous abnormalities. IMPRESSION: Prominent heart size in this portable AP view, cardiomegaly versus technique. No acute pulmonary process. Electronically Signed   By: Narda Rutherford M.D.   On: 03/26/2023 17:22    Microbiology: No results found for this or any previous visit (from the past 240 hour(s)).   Labs: CBC: Recent Labs  Lab 03/25/23 1610 03/26/23 1545 03/28/23 0541  WBC 6.1 6.9 6.0  NEUTROABS 3.4 3.8  --   HGB 12.5 12.3 15.1*  HCT 38.0 38.9 50.1*  MCV 82 84.6 89.6  PLT 268 255 206   Basic Metabolic Panel: Recent Labs  Lab 03/25/23 1610 03/26/23 1545 03/28/23 0541  NA 141 139 135  K 5.7* 4.1 3.9  CL 107* 107 104  CO2 21 21* 18*  GLUCOSE 113* 90 94  BUN 10 7 12   CREATININE 0.64 0.41* 0.58  CALCIUM 10.1 9.4 9.9  MG 1.8 2.1 2.4  PHOS  --   --  5.9*   Liver Function Tests: Recent Labs  Lab 03/25/23 1610 03/26/23 1545  AST 14 23  ALT 18 17  ALKPHOS 146* 113  BILITOT 0.6 0.7  PROT 7.1 7.5  ALBUMIN 4.2 3.7   No results for input(s): "LIPASE", "AMYLASE" in the last 168 hours. No results for input(s): "AMMONIA" in the last 168 hours. Cardiac Enzymes: No results for input(s): "CKTOTAL", "CKMB", "CKMBINDEX", "TROPONINI" in the last 168 hours. BNP (last 3 results) Recent Labs    03/26/23 1545  BNP 237.1*   CBG: No results for input(s): "GLUCAP" in the last 168 hours.  Time spent: 35 minutes  Signed:  Gillis Santa  [facility] 03/28/2023 1:00 PM

## 2023-03-28 NOTE — Progress Notes (Signed)
   03/28/23 1040  TOC Brief Assessment  Insurance and Status Reviewed  Patient has primary care physician Yes  Home environment has been reviewed Home  Prior level of function: Independent  Prior/Current Home Services No current home services  Social Determinants of Health Reivew SDOH reviewed no interventions necessary  Readmission risk has been reviewed Yes  Transition of care needs no transition of care needs at this time

## 2023-03-29 ENCOUNTER — Other Ambulatory Visit (HOSPITAL_BASED_OUTPATIENT_CLINIC_OR_DEPARTMENT_OTHER): Payer: Self-pay | Admitting: Family Medicine

## 2023-03-29 ENCOUNTER — Telehealth (HOSPITAL_BASED_OUTPATIENT_CLINIC_OR_DEPARTMENT_OTHER): Payer: Self-pay | Admitting: Family Medicine

## 2023-03-29 ENCOUNTER — Encounter (HOSPITAL_BASED_OUTPATIENT_CLINIC_OR_DEPARTMENT_OTHER): Payer: Self-pay | Admitting: Family Medicine

## 2023-03-29 DIAGNOSIS — E0590 Thyrotoxicosis, unspecified without thyrotoxic crisis or storm: Secondary | ICD-10-CM

## 2023-03-29 NOTE — Progress Notes (Deleted)
Subjective:   Katalia Genin Jun 29, 1991 04/01/2023  No chief complaint on file.   HPI: Trenyce Kammerdiener presents today for re-assessment and management of chronic medical conditions.  HOSPITAL FOLLOW UP:  Temekia Sullen presents for hospital follow up.  Hospital/Facility: ***  Discharge Diagnosis: *** Admission Date: *** Discharge Date: *** TCM call Date: ***  New Medications: *** Discontinued Medications: ***  Discharge Instructions: *** Referrals: ***    The following portions of the patient's history were reviewed and updated as appropriate: past medical history, past surgical history, family history, social history, allergies, medications, and problem list.   Patient Active Problem List   Diagnosis Date Noted   Thyrotoxicosis 03/26/2023   Syncope 03/25/2023   Bilateral carotid bruits 03/25/2023   History of pregnancy loss, not currently pregnant 09/06/2022   Past Medical History:  Diagnosis Date   History of marijuana use 08/17/2016   +UDS at first prenatal visit   Vaginal delivery    Past Surgical History:  Procedure Laterality Date   APPENDECTOMY     LAPAROSCOPIC APPENDECTOMY N/A 07/23/2018   Procedure: APPENDECTOMY LAPAROSCOPIC;  Surgeon: Ovidio Kin, MD;  Location: [facility];  Service: General;  Laterality: N/A;   WISDOM TOOTH EXTRACTION     Family History  Problem Relation Age of Onset   Asthma Neg Hx    Diabetes Neg Hx    Heart disease Neg Hx    Hypertension Neg Hx    Kidney disease Neg Hx    Stroke Neg Hx    Outpatient Medications Prior to Visit  Medication Sig Dispense Refill   ibuprofen (ADVIL) 400 MG tablet Take 2 tablets (800 mg total) by mouth every 8 (eight) hours as needed for headache (or pain).     methimazole (TAPAZOLE) 10 MG tablet Take 6 tablets (60 mg total) by mouth daily. 540 tablet 3   propranolol (INDERAL) 40 MG tablet Take 1 tablet (40 mg total) by mouth 2 (two) times daily. Hold if SBP <130 60  tablet 5   No facility-administered medications prior to visit.   Allergies  Allergen Reactions   Tape Other (See Comments)    Irritates the skin   Latex Rash     ROS: A complete ROS was performed with pertinent positives/negatives noted in the HPI. The remainder of the ROS are negative.    Objective:   There were no vitals filed for this visit.  Physical Exam          GENERAL: Well-appearing, in NAD. Well nourished.  SKIN: Pink, warm and dry. No rash, lesion, ulceration, or ecchymoses.  Head: Normocephalic. NECK: Trachea midline. Full ROM w/o pain or tenderness. No lymphadenopathy.  EARS: Tympanic membranes are intact, translucent without bulging and without drainage. Appropriate landmarks visualized.  EYES: Conjunctiva clear without exudates. EOMI, PERRL, no drainage present.  NOSE: Septum midline w/o deformity. Nares patent, mucosa pink and non-inflamed w/o drainage. No sinus tenderness.  THROAT: Uvula midline. Oropharynx clear. Tonsils non-inflamed without exudate. Mucous membranes pink and moist.  RESPIRATORY: Chest wall symmetrical. Respirations even and non-labored. Breath sounds clear to auscultation bilaterally.  CARDIAC: S1, S2 present, regular rate and rhythm without murmur or gallops. Peripheral pulses 2+ bilaterally.  MSK: Muscle tone and strength appropriate for age. Joints w/o tenderness, redness, or swelling.  EXTREMITIES: Without clubbing, cyanosis, or edema.  NEUROLOGIC: No motor or sensory deficits. Steady, even gait. C2-C12 intact.  PSYCH/MENTAL STATUS: Alert, oriented x 3. Cooperative, appropriate mood and affect.   There are no preventive  care reminders to display for this patient.  No results found for any visits on 04/01/23.  The ASCVD Risk score (Arnett DK, et al., 2019) failed to calculate for the following reasons:   The 2019 ASCVD risk score is only valid for ages 73 to 71     Assessment & Plan:  *** There are no diagnoses linked to this  encounter.  No orders of the defined types were placed in this encounter.  Lab Orders  No laboratory test(s) ordered today   No images are attached to the encounter or orders placed in the encounter.  No follow-ups on file.    Patient to reach out to office if new, worrisome, or unresolved symptoms arise or if no improvement in patient's condition. Patient verbalized understanding and is agreeable to treatment plan. All questions answered to patient's satisfaction.   Of note, portions of this note may have been created with voice recognition software Physicist, medical). While this note has been edited for accuracy, occasional wrong-word or 'sound-a-like' substitutions may have occurred due to the inherent limitations of voice recognition software.  Yolanda Manges, FNP

## 2023-03-29 NOTE — Progress Notes (Addendum)
The patient states that discharge instructions were provided to her but not reviewed by bedside RN completing discharge. She expressed concerns over dosing frequency for Methimazole. AVS reviewed with patient and encouraged to contact PCP and endocrinologist. She was also encouraged to purchase a BP monitor to check BP before medications as propanolol has parameters for SBP.Hospitalist contacted to assist.  AVS reviewed and pt encouraged to follow medication regimen per instruction.Note for work provided and discussed referral with [facility] [phone]. Permission given to provide Hospitalist office number provided also for near future questions concerns [phone].

## 2023-03-29 NOTE — Telephone Encounter (Signed)
Patient needs referral to endocrinology please call patient she has other medical questions

## 2023-03-30 LAB — THYROID ANTIBODIES (THYROPEROXIDASE & THYROGLOBULIN)
Thyroglobulin Antibody: 2.3 [IU]/mL — ABNORMAL HIGH (ref 0.0–0.9)
Thyroperoxidase Ab SerPl-aCnc: 64 [IU]/mL — ABNORMAL HIGH (ref 0–34)

## 2023-04-01 ENCOUNTER — Inpatient Hospital Stay (HOSPITAL_BASED_OUTPATIENT_CLINIC_OR_DEPARTMENT_OTHER): Payer: Medicaid Other | Admitting: Family Medicine

## 2023-04-09 ENCOUNTER — Telehealth: Payer: Self-pay | Admitting: Radiology

## 2023-04-09 NOTE — Telephone Encounter (Signed)
Pt states that she never received the heart monitor that was supposed to be shipped on 8/19. Please advise

## 2023-04-09 NOTE — Telephone Encounter (Signed)
According to USPS tracking, monitor was delivered at [facility] 03/27/23.  Patient did not receive and has had trouble with mail at her complex. She requested a replacement monitor be shipped to her grandmothers home at  [address] [place], [place], [place]  [address]. [facility]eral at [facility] notified.

## 2023-05-27 ENCOUNTER — Ambulatory Visit: Payer: Medicaid Other

## 2023-05-30 ENCOUNTER — Ambulatory Visit: Payer: Medicaid Other | Admitting: Endocrinology

## 2023-06-02 NOTE — Progress Notes (Deleted)
Cardiology Office Note:    Date:  06/02/2023   ID:  Tricia, Campbell 05-28-1991, MRN [id number]  PCP:  Hilbert Bible, FNP  Cardiologist:  None  Electrophysiologist:  None   Referring MD: Hilbert Bible, *   No chief complaint on file. ***  History of Present Illness:    Tricia Campbell is a 32 y.o. female with a hx of hyperthyroidism who presents for an initial visit for***she was admitted 03/2023 with thyrotoxicosis.  She had had syncopal episode.  She was started on methimazole and propranolol, as well as steroids.  Echocardiogram showed EF 55 to 60%, no significant valvular disease.  Past Medical History:  Diagnosis Date   History of marijuana use 08/17/2016   +UDS at first prenatal visit   Vaginal delivery     Past Surgical History:  Procedure Laterality Date   APPENDECTOMY     LAPAROSCOPIC APPENDECTOMY N/A 07/23/2018   Procedure: APPENDECTOMY LAPAROSCOPIC;  Surgeon: Ovidio Kin, MD;  Location: [facility];  Service: General;  Laterality: N/A;   WISDOM TOOTH EXTRACTION      Current Medications: No outpatient medications have been marked as taking for the 06/03/23 encounter (Appointment) with Little Ishikawa, MD.     Allergies:   Tape and Latex   Social History   Socioeconomic History   Marital status: Single    Spouse name: Not on file   Number of children: Not on file   Years of education: Not on file   Highest education level: Not on file  Occupational History   Not on file  Tobacco Use   Smoking status: Former    Current packs/day: 0.00    Types: Cigars, Cigarettes    Quit date: 12/05/2015    Years since quitting: 7.4   Smokeless tobacco: Never   Tobacco comments:    Black and milds some days  Vaping Use   Vaping status: Never Used  Substance and Sexual Activity   Alcohol use: No   Drug use: No   Sexual activity: Not Currently  Other Topics Concern   Not on file  Social History Narrative   Not on file    Social Determinants of Health   Financial Resource Strain: Not on file  Food Insecurity: No Food Insecurity (03/26/2023)   Hunger Vital Sign    Worried About Running Out of Food in the Last Year: Never true    Ran Out of Food in the Last Year: Never true  Transportation Needs: No Transportation Needs (03/26/2023)   PRAPARE - Administrator, Civil Service (Medical): No    Lack of Transportation (Non-Medical): No  Physical Activity: Not on file  Stress: Not on file  Social Connections: Not on file     Family History: The patient's ***family history is negative for Asthma, Diabetes, Heart disease, Hypertension, Kidney disease, and Stroke.  ROS:   Please see the history of present illness.    *** All other systems reviewed and are negative.  EKGs/Labs/Other Studies Reviewed:    The following studies were reviewed today: ***  EKG:  EKG is *** ordered today.  The ekg ordered today demonstrates ***  Recent Labs: 03/25/2023: TSH <0.005 03/26/2023: ALT 17; B Natriuretic Peptide 237.1 03/28/2023: BUN 12; Creatinine, Ser 0.58; Hemoglobin 15.1; Magnesium 2.4; Platelets 206; Potassium 3.9; Sodium 135  Recent Lipid Panel    Component Value Date/Time   CHOL 78 (L) 03/25/2023 1610   TRIG 71 03/25/2023 1610   HDL 52 03/25/2023 1610  CHOLHDL 1.5 03/25/2023 1610   LDLCALC 10 03/25/2023 1610    Physical Exam:    VS:  There were no vitals taken for this visit.    Wt Readings from Last 3 Encounters:  03/26/23 125 lb 10.6 oz (57 kg)  03/25/23 125 lb (56.7 kg)  03/11/23 125 lb 6.4 oz (56.9 kg)     GEN: *** Well nourished, well developed in no acute distress HEENT: Normal NECK: No JVD; No carotid bruits LYMPHATICS: No lymphadenopathy CARDIAC: ***RRR, no murmurs, rubs, gallops RESPIRATORY:  Clear to auscultation without rales, wheezing or rhonchi  ABDOMEN: Soft, non-tender, non-distended MUSCULOSKELETAL:  No edema; No deformity  SKIN: Warm and dry NEUROLOGIC:  Alert  and oriented x 3 PSYCHIATRIC:  Normal affect   ASSESSMENT:    No diagnosis found. PLAN:    Syncope: Occurred in setting of thyrotoxicosis.  Now follows with endocrinology and on treatment for hyperthyroidism.  Echocardiogram 03/2023 showed normal biventricular function, no significant valvular disease.  She is discharged with cardiac monitor did not turn in   RTC in***  Medication Adjustments/Labs and Tests Ordered: Current medicines are reviewed at length with the patient today.  Concerns regarding medicines are outlined above.  No orders of the defined types were placed in this encounter.  No orders of the defined types were placed in this encounter.   There are no Patient Instructions on file for this visit.   Signed, Little Ishikawa, MD  06/02/2023 9:19 PM    [facility]

## 2023-06-03 ENCOUNTER — Ambulatory Visit: Payer: Medicaid Other | Admitting: Cardiology

## 2023-06-05 ENCOUNTER — Other Ambulatory Visit: Payer: Self-pay

## 2023-06-05 ENCOUNTER — Ambulatory Visit: Payer: Medicaid Other | Admitting: *Deleted

## 2023-06-05 VITALS — BP 111/79 | HR 110 | Ht 66.0 in | Wt 131.0 lb

## 2023-06-05 DIAGNOSIS — Z3042 Encounter for surveillance of injectable contraceptive: Secondary | ICD-10-CM

## 2023-06-05 MED ORDER — MEDROXYPROGESTERONE ACETATE 150 MG/ML IM SUSY
150.0000 mg | PREFILLED_SYRINGE | Freq: Once | INTRAMUSCULAR | Status: AC
Start: 2023-06-05 — End: 2023-06-05
  Administered 2023-06-05: 150 mg via INTRAMUSCULAR

## 2023-06-05 NOTE — Progress Notes (Signed)
Here for depo-provera last given 03/11/23. Last exam was postpartum 09/06/22. Last pap 03/01/21. Injection given without complaint. Advised to schedule next injection at checkout 08/21/23- 09/04/23 and then will need annual 09/08/23 or after. She voices understanding.

## 2023-07-23 NOTE — Progress Notes (Unsigned)
Cardiology Office Note:    Date:  07/24/2023   ID:  Tricia Campbell, Tricia Campbell Dec 31, 1990, MRN [id number]  PCP:  Hilbert Bible, FNP  Cardiologist:  Little Ishikawa, MD  Electrophysiologist:  None   Referring MD: Hilbert Bible, *   Chief Complaint  Patient presents with   Palpitations    History of Present Illness:    Tricia Campbell is a 32 y.o. female with a hx of hyperthyroidism presents for an initial evaluation for syncope.  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  Had presented with near syncopal episode and found to have severe hyperthyroidism.  She was started on Tapazole and propranolol.  Echocardiogram 03/27/2023 showed normal biventricular function, no significant valvular disease.  She was discharged with Zio patch but repots never received.   She reports prior to her admission in 03/2023 she had syncopal episode.  States that she was at the store and started to feel hot and heart was racing.  She then passed out and awoke on the floor.  She was admitted and diagnosed with hyperthyroidism.  She has been following with Dr. Harl Bowie endocrinology, states that she has improved and is now off her thyroid medication.  Denies any further syncopal episodes.  Does report she continues to have palpitations.  Happens daily, feels like heart is racing.  She has not been exercising.  Smokes marijuana.  No family history of heart disease.    Past Medical History:  Diagnosis Date   History of marijuana use 08/17/2016   +UDS at first prenatal visit   Vaginal delivery     Past Surgical History:  Procedure Laterality Date   APPENDECTOMY     LAPAROSCOPIC APPENDECTOMY N/A 07/23/2018   Procedure: APPENDECTOMY LAPAROSCOPIC;  Surgeon: Ovidio Kin, MD;  Location: [facility];  Service: General;  Laterality: N/A;   WISDOM TOOTH EXTRACTION      Current Medications: Current Meds  Medication Sig   ibuprofen (ADVIL) 400 MG tablet Take 2 tablets (800 mg total) by  mouth every 8 (eight) hours as needed for headache (or pain).     Allergies:   Tape and Latex   Social History   Socioeconomic History   Marital status: Single    Spouse name: Not on file   Number of children: Not on file   Years of education: Not on file   Highest education level: Not on file  Occupational History   Not on file  Tobacco Use   Smoking status: Former    Current packs/day: 0.00    Types: Cigars, Cigarettes    Quit date: 12/05/2015    Years since quitting: 7.6   Smokeless tobacco: Never   Tobacco comments:    Black and milds some days  Vaping Use   Vaping status: Never Used  Substance and Sexual Activity   Alcohol use: No   Drug use: No   Sexual activity: Not Currently  Other Topics Concern   Not on file  Social History Narrative   Not on file   Social Drivers of Health   Financial Resource Strain: Not on file  Food Insecurity: No Food Insecurity (03/26/2023)   Hunger Vital Sign    Worried About Running Out of Food in the Last Year: Never true    Ran Out of Food in the Last Year: Never true  Transportation Needs: No Transportation Needs (03/26/2023)   PRAPARE - Administrator, Civil Service (Medical): No    Lack of Transportation (Non-Medical): No  Physical  Activity: Not on file  Stress: Not on file  Social Connections: Not on file     Family History: The patient's family history is negative for Asthma, Diabetes, Heart disease, Hypertension, Kidney disease, and Stroke.  ROS:   Please see the history of present illness.     All other systems reviewed and are negative.  EKGs/Labs/Other Studies Reviewed:    The following studies were reviewed today:   EKG:   07/24/23: Sinus tachycardia, rate 103, no ST abnormal  Recent Labs: 03/25/2023: TSH <0.005 03/26/2023: ALT 17; B Natriuretic Peptide 237.1 03/28/2023: BUN 12; Creatinine, Ser 0.58; Hemoglobin 15.1; Magnesium 2.4; Platelets 206; Potassium 3.9; Sodium 135  Recent Lipid Panel     Component Value Date/Time   CHOL 78 (L) 03/25/2023 1610   TRIG 71 03/25/2023 1610   HDL 52 03/25/2023 1610   CHOLHDL 1.5 03/25/2023 1610   LDLCALC 10 03/25/2023 1610    Physical Exam:    VS:  BP 106/68   Pulse (!) 103   Ht 5\' 6"  (1.676 m)   Wt 126 lb (57.2 kg)   SpO2 96%   BMI 20.34 kg/m     Wt Readings from Last 3 Encounters:  07/24/23 126 lb (57.2 kg)  06/05/23 131 lb (59.4 kg)  03/26/23 125 lb 10.6 oz (57 kg)     GEN:  Well nourished, well developed in no acute distress HEENT: Normal NECK: No JVD; No carotid bruits LYMPHATICS: No lymphadenopathy CARDIAC: RRR, no murmurs, rubs, gallops RESPIRATORY:  Clear to auscultation without rales, wheezing or rhonchi  ABDOMEN: Soft, non-tender, non-distended MUSCULOSKELETAL:  No edema; No deformity  SKIN: Warm and dry NEUROLOGIC:  Alert and oriented x 3 PSYCHIATRIC:  Normal affect   ASSESSMENT:    1. Syncope and collapse   2. Marijuana use   3. Hyperthyroidism    PLAN:    Syncope/palpitations: She was admitted 03/2023 with thyrotoxicosis.  Had presented with syncopal episode and found to have severe hyperthyroidism.  She was started on Tapazole and propranolol.  Echocardiogram 03/27/2023 showed normal biventricular function, no significant valvular disease.  She was discharged with Zio patch but was not turned in. -Suspect syncopal episode was related to hyperthyroidism, now controlled and follows with endocrinology.  She does continue to have palpitations however.  Recommend Zio patch x 7 days to rule out arrhythmia  Hyperthyroidism on methimazole and propranolol.  Follows with endocrinology  Marijuana use: Cessation recommended  RTC in 6 months   Medication Adjustments/Labs and Tests Ordered: Current medicines are reviewed at length with the patient today.  Concerns regarding medicines are outlined above.  Orders Placed This Encounter  Procedures   LONG TERM MONITOR (3-14 DAYS)   EKG 12-Lead   No orders of the  defined types were placed in this encounter.   Patient Instructions  Medication Instructions:   No changes    Lab Work:  No changes   Testing/Procedures: You wil be contact to a schedule appointment to have  monitor placed   [address] Your physician has recommended that you wear a holter monitor- 7 days Zio. Holter monitors are medical devices that record the heart's electrical activity. Doctors most often use these monitors to diagnose arrhythmias. Arrhythmias are problems with the speed or rhythm of the heartbeat. The monitor is a small, portable device. You can wear one while you do your normal daily activities. This is usually used to diagnose what is causing palpitations/syncope (passing out).    Follow-Up: At  [facility], you and your health needs are our priority.  As part of our continuing mission to provide you with exceptional heart care, we have created designated Provider Care Teams.  These Care Teams include your primary Cardiologist (physician) and Advanced Practice Providers (APPs -  Physician Assistants and Nurse Practitioners) who all work together to provide you with the care you need, when you need it.     Your next appointment:   6 month(s)  The format for your next appointment:   In Person  Provider:   Little Ishikawa, MD    Other Instructions   Christena Deem- Long Term Monitor Instructions  Your physician has requested you wear a ZIO patch monitor for 14 days.  This is a single patch monitor. [employer] supplies one patch monitor per enrollment. Additional stickers are not available. Please do not apply patch if you will be having a Nuclear Stress Test,  Echocardiogram, Cardiac CT, MRI, or Chest Xray during the period you would be wearing the  monitor. The patch cannot be worn during these tests. You cannot remove and re-apply the  ZIO XT patch monitor.  Your ZIO patch monitor will be mailed 3 day USPS to your address on  file. It may take 3-5 days  to receive your monitor after you have been enrolled.  Once you have received your monitor, please review the enclosed instructions. Your monitor  has already been registered assigning a specific monitor serial # to you.  Billing and Patient Assistance Program Information  We have supplied [employer] with any of your insurance information on file for billing purposes. [employer] offers a sliding scale Patient Assistance Program for patients that do not have  insurance, or whose insurance does not completely cover the cost of the ZIO monitor.  You must apply for the Patient Assistance Program to qualify for this discounted rate.  To apply, please call [employer] at [phone], select option 4, select option 2, ask to apply for  Patient Assistance Program. Meredeth Ide will ask your household income, and how many people  are in your household. They will quote your out-of-pocket cost based on that information.  [employer] will also be able to set up a 32-month, interest-free payment plan if needed.  Applying the monitor   Shave hair from upper left chest.  Hold abrader disc by orange tab. Rub abrader in 40 strokes over the upper left chest as  indicated in your monitor instructions.  Clean area with 4 enclosed alcohol pads. Let dry.  Apply patch as indicated in monitor instructions. Patch will be placed under collarbone on left  side of chest with arrow pointing upward.  Rub patch adhesive wings for 2 minutes. Remove white label marked "1". Remove the white  label marked "2". Rub patch adhesive wings for 2 additional minutes.  While looking in a mirror, press and release button in center of patch. A small green light will  flash 3-4 times. This will be your only indicator that the monitor has been turned on.  Do not shower for the first 24 hours. You may shower after the first 24 hours.  Press the button if you feel a symptom. You will hear a small click. Record Date, Time and   Symptom in the Patient Logbook.  When you are ready to remove the patch, follow instructions on the last 2 pages of Patient  Logbook. Stick patch monitor onto the last page of Patient Logbook.  Place Patient Logbook in the blue and white  box. Use locking tab on box and tape box closed  securely. The blue and white box has prepaid postage on it. Please place it in the mailbox as  soon as possible. Your physician should have your test results approximately 7 days after the  monitor has been mailed back to [facility].  Call [facility] Customer Care at [phone] if you have questions regarding  your ZIO XT patch monitor. Call them immediately if you see an orange light blinking on your  monitor.  If your monitor falls off in less than 4 days, contact our Monitor department at [phone].  If your monitor becomes loose or falls off after 4 days call [employer] at [phone] for  suggestions on securing your monitor    Signed, Little Ishikawa, MD  07/24/2023 11:46 AM    [facility]

## 2023-07-24 ENCOUNTER — Encounter: Payer: Self-pay | Admitting: Cardiology

## 2023-07-24 ENCOUNTER — Ambulatory Visit: Payer: Medicaid Other | Attending: Cardiology | Admitting: Cardiology

## 2023-07-24 VITALS — BP 106/68 | HR 103 | Ht 66.0 in | Wt 126.0 lb

## 2023-07-24 DIAGNOSIS — F1290 Cannabis use, unspecified, uncomplicated: Secondary | ICD-10-CM

## 2023-07-24 DIAGNOSIS — E0590 Thyrotoxicosis, unspecified without thyrotoxic crisis or storm: Secondary | ICD-10-CM | POA: Diagnosis not present

## 2023-07-24 DIAGNOSIS — R55 Syncope and collapse: Secondary | ICD-10-CM

## 2023-07-24 NOTE — Patient Instructions (Signed)
Medication Instructions:   No changes    Lab Work:  No changes   Testing/Procedures: You wil be contact to a schedule appointment to have  monitor placed   [address] Your physician has recommended that you wear a holter monitor- 7 days Zio. Holter monitors are medical devices that record the heart's electrical activity. Doctors most often use these monitors to diagnose arrhythmias. Arrhythmias are problems with the speed or rhythm of the heartbeat. The monitor is a small, portable device. You can wear one while you do your normal daily activities. This is usually used to diagnose what is causing palpitations/syncope (passing out).    Follow-Up: At [facility], you and your health needs are our priority.  As part of our continuing mission to provide you with exceptional heart care, we have created designated Provider Care Teams.  These Care Teams include your primary Cardiologist (physician) and Advanced Practice Providers (APPs -  Physician Assistants and Nurse Practitioners) who all work together to provide you with the care you need, when you need it.     Your next appointment:   6 month(s)  The format for your next appointment:   In Person  Provider:   Little Ishikawa, MD    Other Instructions   Tricia Campbell- Long Term Monitor Instructions  Your physician has requested you wear a ZIO patch monitor for 14 days.  This is a single patch monitor. [employer] supplies one patch monitor per enrollment. Additional stickers are not available. Please do not apply patch if you will be having a Nuclear Stress Test,  Echocardiogram, Cardiac CT, MRI, or Chest Xray during the period you would be wearing the  monitor. The patch cannot be worn during these tests. You cannot remove and re-apply the  ZIO XT patch monitor.  Your ZIO patch monitor will be mailed 3 day USPS to your address on file. It may take 3-5 days  to receive your monitor after you have been  enrolled.  Once you have received your monitor, please review the enclosed instructions. Your monitor  has already been registered assigning a specific monitor serial # to you.  Billing and Patient Assistance Program Information  We have supplied [employer] with any of your insurance information on file for billing purposes. [employer] offers a sliding scale Patient Assistance Program for patients that do not have  insurance, or whose insurance does not completely cover the cost of the ZIO monitor.  You must apply for the Patient Assistance Program to qualify for this discounted rate.  To apply, please call [employer] at [phone], select option 4, select option 2, ask to apply for  Patient Assistance Program. Tricia Campbell will ask your household income, and how many people  are in your household. They will quote your out-of-pocket cost based on that information.  [employer] will also be able to set up a 44-month, interest-free payment plan if needed.  Applying the monitor   Shave hair from upper left chest.  Hold abrader disc by orange tab. Rub abrader in 40 strokes over the upper left chest as  indicated in your monitor instructions.  Clean area with 4 enclosed alcohol pads. Let dry.  Apply patch as indicated in monitor instructions. Patch will be placed under collarbone on left  side of chest with arrow pointing upward.  Rub patch adhesive wings for 2 minutes. Remove white label marked "1". Remove the white  label marked "2". Rub patch adhesive wings for 2 additional minutes.  While looking in a mirror, press and release button in center of patch. A small green light will  flash 3-4 times. This will be your only indicator that the monitor has been turned on.  Do not shower for the first 24 hours. You may shower after the first 24 hours.  Press the button if you feel a symptom. You will hear a small click. Record Date, Time and  Symptom in the Patient Logbook.  When you are ready to remove the  patch, follow instructions on the last 2 pages of Patient  Logbook. Stick patch monitor onto the last page of Patient Logbook.  Place Patient Logbook in the blue and white box. Use locking tab on box and tape box closed  securely. The blue and white box has prepaid postage on it. Please place it in the mailbox as  soon as possible. Your physician should have your test results approximately 7 days after the  monitor has been mailed back to [facility].  Call [facility] Customer Care at [phone] if you have questions regarding  your ZIO XT patch monitor. Call them immediately if you see an orange light blinking on your  monitor.  If your monitor falls off in less than 4 days, contact our Monitor department at [phone].  If your monitor becomes loose or falls off after 4 days call [employer] at [phone] for  suggestions on securing your monitor

## 2023-07-25 ENCOUNTER — Ambulatory Visit: Payer: Medicaid Other | Attending: Cardiology

## 2023-07-25 DIAGNOSIS — E0590 Thyrotoxicosis, unspecified without thyrotoxic crisis or storm: Secondary | ICD-10-CM

## 2023-07-25 DIAGNOSIS — R55 Syncope and collapse: Secondary | ICD-10-CM

## 2023-07-25 NOTE — Progress Notes (Unsigned)
ZIO long term monitor serial # [id number] from office inventory applied to patient using tincture of benzoin.

## 2023-08-21 ENCOUNTER — Ambulatory Visit: Payer: Medicaid Other

## 2023-08-21 NOTE — Progress Notes (Deleted)
 Tricia Campbell here for Depo-Provera  Injection. Patient received last dose on 06/05/23; patient is 11w since last injection--within window to receive next depo dose today. Injection administered without complication. Patient will return in 3 months for next injection between 11/06/23 and 11/20/23. Next annual visit due 09/07/23.   Morgan Arab, RN 08/21/2023  9:42 AM

## 2023-08-22 NOTE — Progress Notes (Signed)
Patient originally came in for scheduled depo injection on 08/21/23; however, upon rooming and check-in with RN, patient expressed that she had concerns of continuing depo due to recent lawsuit against depo provera. Patient asked RN what she should do--informed patient that I could not tell her what to do, but that she had a few options. Told patient that she could either: 1. Receive her scheduled depo injection today to continue to be covered in terms of birth control and schedule a visit with a provider before next injection to discuss depo and other alternative options, or 2. Patient could not receive depo today, use a back-up method of birth control (like condoms), and still schedule visit with provider asap to discuss alternative contraception options. Informed patient that she is due for an annual exam in February 2025.   Due to need for annual, patient's concerns, and patient's report that she is currently abstinent--patient decided to cancel appointment today and not receive depo. Patient scheduled for annual and birth control consultation on 09/11/23 with Edd Arbour CNM.   Maureen Ralphs RN on 08/22/23 at [phone]

## 2023-09-10 NOTE — Progress Notes (Signed)
 Pt did not come to appt

## 2023-09-11 ENCOUNTER — Ambulatory Visit: Payer: Medicaid Other | Admitting: Certified Nurse Midwife

## 2023-09-11 DIAGNOSIS — E0590 Thyrotoxicosis, unspecified without thyrotoxic crisis or storm: Secondary | ICD-10-CM

## 2023-09-11 DIAGNOSIS — Z01419 Encounter for gynecological examination (general) (routine) without abnormal findings: Secondary | ICD-10-CM

## 2023-09-11 DIAGNOSIS — R0989 Other specified symptoms and signs involving the circulatory and respiratory systems: Secondary | ICD-10-CM

## 2023-09-11 DIAGNOSIS — Z3009 Encounter for other general counseling and advice on contraception: Secondary | ICD-10-CM

## 2023-09-19 ENCOUNTER — Telehealth: Payer: Self-pay | Admitting: Family Medicine

## 2023-09-19 NOTE — Telephone Encounter (Signed)
Patient would like a call in regards to the Depo and the news that is going on about it being bad for, she is currently on Depo but want to know if it is ok for her to continue.

## 2023-09-30 ENCOUNTER — Encounter: Payer: Self-pay | Admitting: *Deleted

## 2023-09-30 NOTE — Telephone Encounter (Signed)
 I called Tricia Campbell and heard a message" voicemail is full". I sent a MyChart message that we got her message and tried to reach her, but she may make an appt if she wishes to talk with provider about this. [name]

## 2023-10-08 ENCOUNTER — Other Ambulatory Visit (HOSPITAL_COMMUNITY)
Admission: RE | Admit: 2023-10-08 | Discharge: 2023-10-08 | Disposition: A | Discharge status: Home / Self Care | Source: Ambulatory Visit | Attending: Family Medicine | Admitting: Family Medicine

## 2023-10-08 ENCOUNTER — Other Ambulatory Visit: Payer: Self-pay

## 2023-10-08 ENCOUNTER — Ambulatory Visit: Payer: Medicaid Other

## 2023-10-08 VITALS — BP 129/74 | HR 109 | Ht 67.0 in | Wt 126.0 lb

## 2023-10-08 DIAGNOSIS — Z113 Encounter for screening for infections with a predominantly sexual mode of transmission: Secondary | ICD-10-CM | POA: Diagnosis present
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
## 2023-10-09 LAB — CERVICOVAGINAL ANCILLARY ONLY
Bacterial Vaginitis (gardnerella): NEGATIVE
Candida Glabrata: NEGATIVE
Candida Vaginitis: NEGATIVE
Chlamydia: NEGATIVE
Comment: NEGATIVE
Comment: NEGATIVE
Comment: NEGATIVE
Comment: NEGATIVE
Comment: NEGATIVE
Comment: NORMAL
Neisseria Gonorrhea: NEGATIVE
Trichomonas: NEGATIVE

## 2023-10-16 ENCOUNTER — Encounter: Payer: Self-pay | Admitting: Family Medicine

## 2023-10-16 ENCOUNTER — Other Ambulatory Visit (HOSPITAL_COMMUNITY)
Admission: RE | Admit: 2023-10-16 | Discharge: 2023-10-16 | Disposition: A | Discharge status: Home / Self Care | Source: Ambulatory Visit | Attending: Obstetrics and Gynecology | Admitting: Obstetrics and Gynecology

## 2023-10-16 ENCOUNTER — Ambulatory Visit: Payer: Medicaid Other | Admitting: Obstetrics and Gynecology

## 2023-10-16 VITALS — BP 127/77 | HR 93 | Ht 66.1 in | Wt 128.0 lb

## 2023-10-16 DIAGNOSIS — Z113 Encounter for screening for infections with a predominantly sexual mode of transmission: Secondary | ICD-10-CM | POA: Diagnosis not present

## 2023-10-16 DIAGNOSIS — Z01419 Encounter for gynecological examination (general) (routine) without abnormal findings: Secondary | ICD-10-CM | POA: Diagnosis not present

## 2023-10-16 DIAGNOSIS — Z124 Encounter for screening for malignant neoplasm of cervix: Secondary | ICD-10-CM

## 2023-10-16 DIAGNOSIS — Z3202 Encounter for pregnancy test, result negative: Secondary | ICD-10-CM

## 2023-10-16 DIAGNOSIS — Z7251 High risk heterosexual behavior: Secondary | ICD-10-CM | POA: Diagnosis not present

## 2023-10-16 LAB — POCT PREGNANCY, URINE: Preg Test, Ur: NEGATIVE

## 2023-10-16 NOTE — Progress Notes (Signed)
 ANNUAL EXAM Patient name: Tricia Campbell MRN [id number]  Date of birth: 03-03-1991 Chief Complaint:   Gynecologic Exam  History of Present Illness:   Tricia Campbell is a 33 y.o. [phone] being seen today for a routine annual exam.  Current complaints: annual, pap  Menstrual concerns? No  minimal spotting while on depo Breast or nipple changes? No  Contraception use? Yes depo on 1/16 and going to switch to condoms; requests UPT today due to recent unprotected intercourse Sexually active? Yes   No LMP recorded.   Upstream - 10/16/23 1611       Pregnancy Intention Screening   Does the patient want to become pregnant in the next year? No    Does the patient's partner want to become pregnant in the next year? No    Would the patient like to discuss contraceptive options today? No      Contraception Wrap Up   Current Method No Contraceptive Precautions    End Method No Contraception Precautions    Contraception Counseling Provided No    How was the end contraceptive method provided? N/A            The pregnancy intention screening data noted above was reviewed. Potential methods of contraception were discussed. The patient elected to proceed with No Contraception Precautions.   Last pap     Component Value Date/Time   DIAGPAP  03/01/2021 1518    - Negative for intraepithelial lesion or malignancy (NILM)   DIAGPAP Molecular only (A) 05/14/2019 1030   DIAGPAP  04/02/2017 0000    NEGATIVE FOR INTRAEPITHELIAL LESIONS OR MALIGNANCY.   ADEQPAP  03/01/2021 1518    Satisfactory for evaluation; transformation zone component PRESENT.   ADEQPAP Molecular only 05/14/2019 1030   ADEQPAP  04/02/2017 0000    Satisfactory for evaluation  endocervical/transformation zone component PRESENT.    Last mammogram: n/a.  Last colonoscopy: n/a.      03/25/2023    3:37 PM 08/08/2022    9:36 AM 05/28/2022    3:28 PM 07/27/2021   10:45 AM 06/15/2021    2:38 PM  Depression  screen PHQ 2/9  Decreased Interest 1 0 2 1 0  Down, Depressed, Hopeless 1 0 0 0 0  PHQ - 2 Score 2 0 2 1 0  Altered sleeping 1 0 1 0 0  Tired, decreased energy 1 3 2 1 3   Change in appetite 1 0 1 1 2   Feeling bad or failure about yourself  0 0 0 0 0  Trouble concentrating 0 1 0 0 0  Moving slowly or fidgety/restless 1 0 0 1 0  Suicidal thoughts 0 0 0 0 0  PHQ-9 Score 6 4 6 4 5   Difficult doing work/chores Not difficult at all            03/25/2023    3:38 PM 08/08/2022    9:42 AM 05/28/2022    3:28 PM 07/27/2021   10:46 AM  GAD 7 : Generalized Anxiety Score  Nervous, Anxious, on Edge 1 0 1 0  Control/stop worrying 1 0 0 0  Worry too much - different things 1 0 0 0  Trouble relaxing 1 0 2 0  Restless 1 0 0   Easily annoyed or irritable 1 0 1   Afraid - awful might happen 1 1 0   Total GAD 7 Score 7 1 4    Anxiety Difficulty Somewhat difficult        Review of Systems:  Pertinent items are noted in HPI Denies any headaches, blurred vision, fatigue, shortness of breath, chest pain, abdominal pain, abnormal vaginal discharge/itching/odor/irritation, problems with periods, bowel movements, urination, or intercourse unless otherwise stated above. Pertinent History Reviewed:  Reviewed past medical,surgical, social and family history.  Reviewed problem list, medications and allergies. Physical Assessment:   Vitals:   10/16/23 1609  BP: 127/77  Pulse: 93  Weight: 128 lb (58.1 kg)  Height: 5' 6.1" (1.679 m)  Body mass index is 20.6 kg/m.        Physical Examination:   General appearance - well appearing, and in no distress  Mental status - alert, oriented to person, place, and time  Psych:  She has a normal mood and affect  Skin - warm and dry, normal color, no suspicious lesions noted  Chest - effort normal, all lung fields clear to auscultation bilaterally  Heart - normal rate and regular rhythm  Abdomen - soft, nontender, nondistended, no masses or  organomegaly  Pelvic -  VULVA: normal appearing vulva with no masses, tenderness or lesions   VAGINA: normal appearing vagina with normal color and discharge, no lesions   CERVIX: normal appearing cervix without discharge or lesions, no CMT  Thin prep pap is done with HR HPV cotesting  UTERUS: uterus is felt to be normal size, shape, consistency and nontender   ADNEXA: No adnexal masses or tenderness noted.  Extremities:  No swelling or varicosities noted  Chaperone present for exam  No results found for this or any previous visit (from the past 24 hours).    Assessment & Plan:  1. Screening for cervical cancer (Primary) Routine pap collected - Cytology - PAP( [place])  2. Screening examination for STD (sexually transmitted disease) - Cervicovaginal ancillary only( [place])  3. Encounter for well woman exam with routine gynecological exam - Cervical cancer screening: Discussed guidelines. Pap with HPV collected - GC/CT: accepts - Birth Control: Condoms - Breast Health: Encouraged self breast awareness/SBE. Teaching provided.  - F/U 12 months and prn  - RPR+HBsAg+HCVAb+...       Orders Placed This Encounter  Procedures   RPR+HBsAg+HCVAb+...    Meds: No orders of the defined types were placed in this encounter.   Follow-up: No follow-ups on file.  Tricia Shire, MD 10/16/2023 4:17 PM

## 2023-10-17 ENCOUNTER — Encounter: Payer: Self-pay | Admitting: Obstetrics and Gynecology

## 2023-10-17 LAB — CERVICOVAGINAL ANCILLARY ONLY
Bacterial Vaginitis (gardnerella): NEGATIVE
Candida Glabrata: NEGATIVE
Candida Vaginitis: NEGATIVE
Chlamydia: NEGATIVE
Comment: NEGATIVE
Comment: NEGATIVE
Comment: NEGATIVE
Comment: NEGATIVE
Comment: NEGATIVE
Comment: NORMAL
Neisseria Gonorrhea: NEGATIVE
Trichomonas: NEGATIVE

## 2023-10-17 LAB — RPR+HBSAG+HCVAB+...
HIV Screen 4th Generation wRfx: NONREACTIVE
Hep C Virus Ab: NONREACTIVE
Hepatitis B Surface Ag: NEGATIVE
RPR Ser Ql: NONREACTIVE

## 2023-10-18 LAB — CYTOLOGY - PAP
Comment: NEGATIVE
Diagnosis: NEGATIVE
High risk HPV: NEGATIVE

## 2023-10-21 ENCOUNTER — Encounter: Payer: Self-pay | Admitting: Advanced Practice Midwife

## 2023-12-23 ENCOUNTER — Ambulatory Visit

## 2024-01-26 NOTE — Progress Notes (Unsigned)
 Cardiology Office Note:    Date:  01/27/2024   ID:  Campbell, Tricia 08-May-1991, MRN [id number]  PCP:  Knute Thersia Bitters, FNP  Cardiologist:  Lonni LITTIE Nanas, MD  Electrophysiologist:  None   Referring MD: Knute Thersia Bitters, *   Chief Complaint  Patient presents with   Palpitations    History of Present Illness:    Tricia Campbell is a 33 y.o. female with a hx of hyperthyroidism presents for follow-up.  She was seen for an initial evaluation for syncope on 07/24/2019.  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  Had presented with near syncopal episode and found to have severe hyperthyroidism.  She was started on Tapazole  and propranolol .  Echocardiogram 03/27/2023 showed normal biventricular function, no significant valvular disease.  She was discharged with Zio patch but reports never received.   She reports prior to her admission in 03/2023 she had syncopal episode.  States that she was at the store and started to feel hot and heart was racing.  She then passed out and awoke on the floor.  She was admitted and diagnosed with hyperthyroidism.  She has been following with Dr. Rosario endocrinology, states that she has improved and is now off her thyroid  medication.  Denies any further syncopal episodes.  Does report she continues to have palpitations.  Happens daily, feels like heart is racing.  She has not been exercising.  Smokes marijuana.  No family history of heart disease.  Since last clinic visit, she reports she continues to have palpitations.  Can last for minutes.  She denies any syncope.  States that her endocrinologist stopped her methimazole  and propranolol  because hyperthyroidism had resolved.    Past Medical History:  Diagnosis Date   History of marijuana use 08/17/2016   +UDS at first prenatal visit   Vaginal delivery     Past Surgical History:  Procedure Laterality Date   APPENDECTOMY     LAPAROSCOPIC APPENDECTOMY N/A 07/23/2018    Procedure: APPENDECTOMY LAPAROSCOPIC;  Surgeon: Tricia Lenis, MD;  Location: [facility];  Service: General;  Laterality: N/A;   WISDOM TOOTH EXTRACTION      Current Medications: No outpatient medications have been marked as taking for the 01/27/24 encounter (Office Visit) with Nanas Lonni LITTIE, MD.     Allergies:   Tape and Latex   Social History   Socioeconomic History   Marital status: Single    Spouse name: Not on file   Number of children: Not on file   Years of education: Not on file   Highest education level: Not on file  Occupational History   Not on file  Tobacco Use   Smoking status: Former    Current packs/day: 0.00    Types: Cigars, Cigarettes    Quit date: 12/05/2015    Years since quitting: 8.1   Smokeless tobacco: Never   Tobacco comments:    Black and milds some days  Vaping Use   Vaping status: Never Used  Substance and Sexual Activity   Alcohol use: No   Drug use: No   Sexual activity: Not Currently  Other Topics Concern   Not on file  Social History Narrative   Not on file   Social Drivers of Health   Financial Resource Strain: Not on file  Food Insecurity: No Food Insecurity (03/26/2023)   Hunger Vital Sign    Worried About Running Out of Food in the Last Year: Never true    Ran Out of Food in the Last  Year: Never true  Transportation Needs: No Transportation Needs (03/26/2023)   PRAPARE - Administrator, Civil Service (Medical): No    Lack of Transportation (Non-Medical): No  Physical Activity: Not on file  Stress: Not on file  Social Connections: Not on file     Family History: The patient's family history is negative for Asthma, Diabetes, Heart disease, Hypertension, Kidney disease, and Stroke.  ROS:   Please see the history of present illness.     All other systems reviewed and are negative.  EKGs/Labs/Other Studies Reviewed:    The following studies were reviewed today:   EKG:   07/24/23: Sinus tachycardia, rate  103, no ST abnormal 01/27/2024: Normal sinus rhythm, rate 97, no ST abnormalities  Recent Labs: 03/25/2023: TSH <0.005 03/26/2023: ALT 17; B Natriuretic Peptide 237.1 03/28/2023: BUN 12; Creatinine, Ser 0.58; Hemoglobin 15.1; Magnesium  2.4; Platelets 206; Potassium 3.9; Sodium 135  Recent Lipid Panel    Component Value Date/Time   CHOL 78 (L) 03/25/2023 1610   TRIG 71 03/25/2023 1610   HDL 52 03/25/2023 1610   CHOLHDL 1.5 03/25/2023 1610   LDLCALC 10 03/25/2023 1610    Physical Exam:    VS:  BP 126/60   Pulse 97   Ht 5' 6 (1.676 m)   Wt 126 lb (57.2 kg)   SpO2 99%   BMI 20.34 kg/m     Wt Readings from Last 3 Encounters:  01/27/24 126 lb (57.2 kg)  10/16/23 128 lb (58.1 kg)  10/08/23 126 lb (57.2 kg)     GEN:  Well nourished, well developed in no acute distress HEENT: Normal NECK: No JVD; No carotid bruits LYMPHATICS: No lymphadenopathy CARDIAC: RRR, no murmurs, rubs, gallops RESPIRATORY:  Clear to auscultation without rales, wheezing or rhonchi  ABDOMEN: Soft, non-tender, non-distended MUSCULOSKELETAL:  No edema; No deformity  SKIN: Warm and dry NEUROLOGIC:  Alert and oriented x 3 PSYCHIATRIC:  Normal affect   ASSESSMENT:    1. Syncope, unspecified syncope type   2. Palpitations   3. Hyperthyroidism     PLAN:    Syncope/palpitations: She was admitted 03/2023 with thyrotoxicosis.  Had presented with syncopal episode and found to have severe hyperthyroidism.  She was started on methimazole  and propranolol .  Echocardiogram 03/27/2023 showed normal biventricular function, no significant valvular disease.  She was discharged with Zio patch but was not turned in. -Suspect syncopal episode was related to hyperthyroidism.  She does continue to have palpitations however.  Recommend Zio patch x 7 days to rule out arrhythmia, this was ordered at clinic visit 07/2023 by not turned in, states she never received it.  Will plan to place ZIO monitor in clinic  today  Hyperthyroidism: Follows with endocrinology, was on methimazole  and propranolol .  However she reports that her hyperthyroidism resolved and she is currently off any medication.  Will check TSH, free T4  Marijuana use: Cessation recommended  RTC in 6 months   Medication Adjustments/Labs and Tests Ordered: Current medicines are reviewed at length with the patient today.  Concerns regarding medicines are outlined above.  Orders Placed This Encounter  Procedures   TSH   T4, free   LONG TERM MONITOR (3-14 DAYS)   EKG 12-Lead   No orders of the defined types were placed in this encounter.   Patient Instructions  Medication Instructions:  CONTINUE CURRENT MEDICATIONS *If you need a refill on your cardiac medications before your next appointment, please call your pharmacy*  Lab Work: FREE T4, TSH  If you have labs (blood work) drawn today and your tests are completely normal, you will receive your results only by: MyChart Message (if you have MyChart) OR A paper copy in the mail If you have any lab test that is abnormal or we need to change your treatment, we will call you to review the results.  Testing/Procedures: ZIO  ZIO XT- Long Term Monitor Instructions  Your physician has requested you wear a ZIO patch monitor for 7days.  This is a single patch monitor. [employer] supplies one patch monitor per enrollment. Additional stickers are not available. Please do not apply patch if you will be having a Nuclear Stress Test,  Echocardiogram, Cardiac CT, MRI, or Chest Xray during the period you would be wearing the  monitor. The patch cannot be worn during these tests. You cannot remove and re-apply the  ZIO XT patch monitor.  Your ZIO patch monitor will be mailed 3 day USPS to your address on file. It may take 3-5 days  to receive your monitor after you have been enrolled.  Once you have received your monitor, please review the enclosed instructions. Your monitor  has already  been registered assigning a specific monitor serial # to you.  Billing and Patient Assistance Program Information  We have supplied [employer] with any of your insurance information on file for billing purposes. [employer] offers a sliding scale Patient Assistance Program for patients that do not have  insurance, or whose insurance does not completely cover the cost of the ZIO monitor.  You must apply for the Patient Assistance Program to qualify for this discounted rate.  To apply, please call [employer] at [phone], select option 4, select option 2, ask to apply for  Patient Assistance Program. Meredeth will ask your household income, and how many people  are in your household. They will quote your out-of-pocket cost based on that information.  [employer] will also be able to set up a 69-month, interest-free payment plan if needed.  Applying the monitor   Shave hair from upper left chest.  Hold abrader disc by orange tab. Rub abrader in 40 strokes over the upper left chest as  indicated in your monitor instructions.  Clean area with 4 enclosed alcohol pads. Let dry.  Apply patch as indicated in monitor instructions. Patch will be placed under collarbone on left  side of chest with arrow pointing upward.  Rub patch adhesive wings for 2 minutes. Remove white label marked 1. Remove the white  label marked 2. Rub patch adhesive wings for 2 additional minutes.  While looking in a mirror, press and release button in center of patch. A small green light will  flash 3-4 times. This will be your only indicator that the monitor has been turned on.  Do not shower for the first 24 hours. You may shower after the first 24 hours.  Press the button if you feel a symptom. You will hear a small click. Record Date, Time and  Symptom in the Patient Logbook.  When you are ready to remove the patch, follow instructions on the last 2 pages of Patient  Logbook. Stick patch monitor onto the last page of Patient  Logbook.  Place Patient Logbook in the blue and white box. Use locking tab on box and tape box closed  securely. The blue and white box has prepaid postage on it. Please place it in the mailbox as  soon as possible. Your physician should have your test results approximately 7 days after  the  monitor has been mailed back to [place].  Call [facility] Customer Care at [phone] if you have questions regarding  your ZIO XT patch monitor. Call them immediately if you see an orange light blinking on your  monitor.  If your monitor falls off in less than 4 days, contact our Monitor department at [phone].  If your monitor becomes loose or falls off after 4 days call [employer] at [phone] for  suggestions on securing your monitor   Follow-Up: At [facility], you and your health needs are our priority.  As part of our continuing mission to provide you with exceptional heart care, our providers are all part of one team.  This team includes your primary Cardiologist (physician) and Advanced Practice Providers or APPs (Physician Assistants and Nurse Practitioners) who all work together to provide you with the care you need, when you need it.  Your next appointment:   6 month(s)  Provider:   Lonni LITTIE Nanas, MD    We recommend signing up for the patient portal called MyChart.  Sign up information is provided on this After Visit Summary.  MyChart is used to connect with patients for Virtual Visits (Telemedicine).  Patients are able to view lab/test results, encounter notes, upcoming appointments, etc.  Non-urgent messages can be sent to your provider as well.   To learn more about what you can do with MyChart, go to [email or web address].   Other Instructions NONE       Signed, Lonni LITTIE Nanas, MD  01/27/2024 5:46 PM    [facility]

## 2024-01-27 ENCOUNTER — Ambulatory Visit: Payer: Medicaid Other | Attending: Cardiology | Admitting: Cardiology

## 2024-01-27 ENCOUNTER — Encounter: Payer: Self-pay | Admitting: Cardiology

## 2024-01-27 ENCOUNTER — Ambulatory Visit

## 2024-01-27 VITALS — BP 126/60 | HR 97 | Ht 66.0 in | Wt 126.0 lb

## 2024-01-27 DIAGNOSIS — R55 Syncope and collapse: Secondary | ICD-10-CM | POA: Diagnosis not present

## 2024-01-27 DIAGNOSIS — E0590 Thyrotoxicosis, unspecified without thyrotoxic crisis or storm: Secondary | ICD-10-CM | POA: Insufficient documentation

## 2024-01-27 DIAGNOSIS — R002 Palpitations: Secondary | ICD-10-CM

## 2024-01-27 NOTE — Progress Notes (Unsigned)
 Applied a 7 day Zio XT monitor to patient in the office

## 2024-01-27 NOTE — Patient Instructions (Signed)
 Medication Instructions:  CONTINUE CURRENT MEDICATIONS *If you need a refill on your cardiac medications before your next appointment, please call your pharmacy*  Lab Work: FREE T4, TSH If you have labs (blood work) drawn today and your tests are completely normal, you will receive your results only by: MyChart Message (if you have MyChart) OR A paper copy in the mail If you have any lab test that is abnormal or we need to change your treatment, we will call you to review the results.  Testing/Procedures: ZIO  ZIO XT- Long Term Monitor Instructions  Your physician has requested you wear a ZIO patch monitor for 7days.  This is a single patch monitor. [employer] supplies one patch monitor per enrollment. Additional stickers are not available. Please do not apply patch if you will be having a Nuclear Stress Test,  Echocardiogram, Cardiac CT, MRI, or Chest Xray during the period you would be wearing the  monitor. The patch cannot be worn during these tests. You cannot remove and re-apply the  ZIO XT patch monitor.  Your ZIO patch monitor will be mailed 3 day USPS to your address on file. It may take 3-5 days  to receive your monitor after you have been enrolled.  Once you have received your monitor, please review the enclosed instructions. Your monitor  has already been registered assigning a specific monitor serial # to you.  Billing and Patient Assistance Program Information  We have supplied [employer] with any of your insurance information on file for billing purposes. [employer] offers a sliding scale Patient Assistance Program for patients that do not have  insurance, or whose insurance does not completely cover the cost of the ZIO monitor.  You must apply for the Patient Assistance Program to qualify for this discounted rate.  To apply, please call [employer] at [phone], select option 4, select option 2, ask to apply for  Patient Assistance Program. Meredeth will ask your household  income, and how many people  are in your household. They will quote your out-of-pocket cost based on that information.  [employer] will also be able to set up a 42-month, interest-free payment plan if needed.  Applying the monitor   Shave hair from upper left chest.  Hold abrader disc by orange tab. Rub abrader in 40 strokes over the upper left chest as  indicated in your monitor instructions.  Clean area with 4 enclosed alcohol pads. Let dry.  Apply patch as indicated in monitor instructions. Patch will be placed under collarbone on left  side of chest with arrow pointing upward.  Rub patch adhesive wings for 2 minutes. Remove white label marked 1. Remove the white  label marked 2. Rub patch adhesive wings for 2 additional minutes.  While looking in a mirror, press and release button in center of patch. A small green light will  flash 3-4 times. This will be your only indicator that the monitor has been turned on.  Do not shower for the first 24 hours. You may shower after the first 24 hours.  Press the button if you feel a symptom. You will hear a small click. Record Date, Time and  Symptom in the Patient Logbook.  When you are ready to remove the patch, follow instructions on the last 2 pages of Patient  Logbook. Stick patch monitor onto the last page of Patient Logbook.  Place Patient Logbook in the blue and white box. Use locking tab on box and tape box closed  securely. The blue and white  box has prepaid postage on it. Please place it in the mailbox as  soon as possible. Your physician should have your test results approximately 7 days after the  monitor has been mailed back to [facility].  Call [facility] Customer Care at [phone] if you have questions regarding  your ZIO XT patch monitor. Call them immediately if you see an orange light blinking on your  monitor.  If your monitor falls off in less than 4 days, contact our Monitor department at [phone].  If your  monitor becomes loose or falls off after 4 days call [employer] at [phone] for  suggestions on securing your monitor   Follow-Up: At [facility], you and your health needs are our priority.  As part of our continuing mission to provide you with exceptional heart care, our providers are all part of one team.  This team includes your primary Cardiologist (physician) and Advanced Practice Providers or APPs (Physician Assistants and Nurse Practitioners) who all work together to provide you with the care you need, when you need it.  Your next appointment:   6 month(s)  Provider:   Lonni LITTIE Nanas, MD    We recommend signing up for the patient portal called MyChart.  Sign up information is provided on this After Visit Summary.  MyChart is used to connect with patients for Virtual Visits (Telemedicine).  Patients are able to view lab/test results, encounter notes, upcoming appointments, etc.  Non-urgent messages can be sent to your provider as well.   To learn more about what you can do with MyChart, go to [email or web address].   Other Instructions NONE

## 2024-01-28 ENCOUNTER — Ambulatory Visit: Payer: Self-pay | Admitting: Cardiology

## 2024-01-28 LAB — TSH: TSH: <0.005 u[IU]/mL — ABNORMAL LOW (ref 0.450–4.500)

## 2024-01-28 LAB — T4, FREE: Free T4: >7.77 ng/dL — ABNORMAL HIGH (ref 0.82–1.77)

## 2024-01-28 NOTE — Telephone Encounter (Signed)
 Called and made patient aware that per Dr. Kate TSH is undetectably low and free t4 is very high. Needs to restart her methimazole  and appointment with endocrinology ASAP.  Patient requested to start methimazole  after seen by Endocrinology. Made patient aware that Dr. Kate will be made aware and appt. With Endocrinology will be made. Understanding verbalized for appt. with endocrinology, Dr. Micheline office ASAP. Made patient aware to call office for any other questions.

## 2024-01-29 ENCOUNTER — Encounter: Payer: Self-pay | Admitting: *Deleted

## 2024-01-29 ENCOUNTER — Telehealth: Payer: Self-pay | Admitting: *Deleted

## 2024-01-29 NOTE — Telephone Encounter (Signed)
 Called patient and made her aware about appt scheduled on 6/25 with Dr. Faythe Endocrinology. Per patient Zio came off, but she had  zio monitor from 12/19. Made provider aware and  per Dr. Kate both monitor can be mailed in. Advise patient she can  also call company and get another monitor and combine the two monitor, Patient requested to turn both monitors in. Provider made aware. Patient made aware and verbalized understanding of doctor's appt with Dr. Faythe, endocrinology and Zio monitor.

## 2024-01-29 NOTE — Telephone Encounter (Signed)
 Called patient and  left message  that she has an appt  scheduled with Dr.Kerr on 6/26 @1 :00pm. Leftr message to call office back for any questions.

## 2024-01-31 ENCOUNTER — Other Ambulatory Visit: Payer: Self-pay

## 2024-01-31 ENCOUNTER — Ambulatory Visit

## 2024-01-31 ENCOUNTER — Other Ambulatory Visit (HOSPITAL_COMMUNITY)
Admission: RE | Admit: 2024-01-31 | Discharge: 2024-01-31 | Disposition: A | Discharge status: Home / Self Care | Source: Ambulatory Visit | Attending: Family Medicine | Admitting: Family Medicine

## 2024-01-31 VITALS — Ht 66.0 in | Wt 125.0 lb

## 2024-01-31 DIAGNOSIS — A64 Unspecified sexually transmitted disease: Secondary | ICD-10-CM | POA: Insufficient documentation

## 2024-01-31 NOTE — Progress Notes (Signed)
 Pt here today for nurse visit.  Pt requested to do self swab for STD testing.  She denies any vaginal discharge or irritation.  Advised pt that she will be contacted by office staff for any abnormal results.  Pt verbalized understanding with no further questions.    [place], RN

## 2024-02-04 ENCOUNTER — Ambulatory Visit: Payer: Self-pay | Admitting: Obstetrics and Gynecology

## 2024-02-04 LAB — CERVICOVAGINAL ANCILLARY ONLY
Bacterial Vaginitis (gardnerella): POSITIVE — AB
Candida Glabrata: NEGATIVE
Candida Vaginitis: NEGATIVE
Chlamydia: NEGATIVE
Comment: NEGATIVE
Comment: NEGATIVE
Comment: NEGATIVE
Comment: NEGATIVE
Comment: NEGATIVE
Comment: NORMAL
Neisseria Gonorrhea: NEGATIVE
Trichomonas: NEGATIVE

## 2024-02-04 MED ORDER — METRONIDAZOLE 0.75 % VA GEL: 1.0000 | Freq: Every day | VAGINAL | 1 refills | Status: DC

## 2024-02-29 DIAGNOSIS — R002 Palpitations: Secondary | ICD-10-CM | POA: Diagnosis not present

## 2024-03-06 ENCOUNTER — Other Ambulatory Visit: Payer: Self-pay

## 2024-03-06 ENCOUNTER — Inpatient Hospital Stay (HOSPITAL_COMMUNITY)
Admission: AD | Admit: 2024-03-06 | Discharge: 2024-03-06 | Disposition: A | Discharge status: Home / Self Care | Attending: Obstetrics & Gynecology | Admitting: Obstetrics & Gynecology

## 2024-03-06 DIAGNOSIS — Z3201 Encounter for pregnancy test, result positive: Secondary | ICD-10-CM

## 2024-03-06 DIAGNOSIS — Z3A00 Weeks of gestation of pregnancy not specified: Secondary | ICD-10-CM | POA: Diagnosis not present

## 2024-03-06 DIAGNOSIS — O3680X0 Pregnancy with inconclusive fetal viability, not applicable or unspecified: Secondary | ICD-10-CM | POA: Insufficient documentation

## 2024-03-06 DIAGNOSIS — O219 Vomiting of pregnancy, unspecified: Secondary | ICD-10-CM

## 2024-03-06 MED ORDER — ONDANSETRON 4 MG PO TBDP: 4.0000 mg | ORAL_TABLET | Freq: Three times a day (TID) | ORAL | 0 refills | Status: DC | PRN

## 2024-03-06 NOTE — MAU Provider Note (Signed)
 History     CSN: [id number]  Arrival date and time: 03/06/24 9085   Event Date/Time   First Provider Initiated Contact with Patient 03/06/2024  9:25 AM   Chief Complaint  Patient presents with   Emesis    HPI  Tricia Campbell is a 33 y.o. [id number] at Unknown who presents to the MAU to confirm pregnancy and dating. She is uncertain of her LMP. Reports previously on Depo, last dose ~3 months ago. Reports taking home UPT yesterday, which was positive. She reports calling her OB who recommended she come in for U/S. She is unsure why they recommended this. She specifically denies vaginal bleeding or any abdominal pain. Has occasional nausea/vomiting, none currently.  Past Medical History:  Diagnosis Date   History of marijuana use 08/17/2016   +UDS at first prenatal visit   Vaginal delivery     Past Surgical History:  Procedure Laterality Date   APPENDECTOMY     LAPAROSCOPIC APPENDECTOMY N/A 07/23/2018   Procedure: APPENDECTOMY LAPAROSCOPIC;  Surgeon: Ethyl Lenis, MD;  Location: [facility];  Service: General;  Laterality: N/A;   WISDOM TOOTH EXTRACTION      Family History  Problem Relation Age of Onset   Asthma Neg Hx    Diabetes Neg Hx    Heart disease Neg Hx    Hypertension Neg Hx    Kidney disease Neg Hx    Stroke Neg Hx     Social History   Tobacco Use   Smoking status: Former    Current packs/day: 0.00    Types: Cigars, Cigarettes    Quit date: 12/05/2015    Years since quitting: 8.2   Smokeless tobacco: Never   Tobacco comments:    Black and milds some days  Vaping Use   Vaping status: Never Used  Substance Use Topics   Alcohol use: No   Drug use: No    Allergies:  Allergies  Allergen Reactions   Tape Other (See Comments)    Irritates the skin   Latex Rash    Medications Prior to Admission  Medication Sig Dispense Refill Last Dose/Taking   ibuprofen  (ADVIL ) 400 MG tablet Take 2 tablets (800 mg total) by mouth every 8 (eight) hours as needed for  headache (or pain).      metroNIDAZOLE  (METROGEL ) 0.75 % vaginal gel Place 1 Applicatorful vaginally at bedtime. Apply one applicatorful to vagina at bedtime for 5 days 70 g 1     ROS reviewed and pertinent positives and negatives as documented in HPI.  Physical Exam   Blood pressure 109/63, pulse 82, temperature 98.3 F (36.8 C), temperature source Oral, resp. rate 18, height 5' 6 (1.676 m), weight 62 kg, SpO2 99%.  Physical Exam Constitutional:      General: She is not in acute distress.    Appearance: Normal appearance. She is not ill-appearing.  HENT:     Head: Normocephalic and atraumatic.  Cardiovascular:     Rate and Rhythm: Normal rate.  Pulmonary:     Effort: Pulmonary effort is normal.     Breath sounds: Normal breath sounds.  Abdominal:     Palpations: Abdomen is soft.     Tenderness: There is no abdominal tenderness. There is no guarding.  Musculoskeletal:        General: Normal range of motion.  Skin:    General: Skin is warm and dry.     Findings: No rash.  Neurological:     General: No focal deficit present.  Mental Status: She is alert and oriented to person, place, and time.     MAU Course  Procedures  MDM 33 y.o. [id number] at Unknown presenting for positive home UPT. She denies any complaints at present. No abdominal pain or VB reported. I recommended keeping her f/up with her primary OB -- she has a intake appt scheduled for 8/7, and a new OB visit scheduled for 8/12. I scheduled an outpatient dating U/S for her on 8/4 and she was given return and strict ectopic precautions. She is considering termination of pregnancy, she was given resources for providers who provide termination of pregnancies in the area. Stable for d/c.  Assessment and Plan     ICD-10-CM   1. Positive pregnancy test  Z32.01     2. Nausea and vomiting of pregnancy, antepartum  O21.9     Rx for Zofran  sent Dating U/S scheduled for 8/4 Given list of abortion providers in the  area Return precautions given   Kennth Vanbenschoten, MD OB Fellow, Faculty Practice [facility], [facility]  03/06/2024, 9:46 AM

## 2024-03-06 NOTE — Discharge Instructions (Signed)
 [facility] Address: [address] [place]. [address] [place], [place] [address] Phone: [phone] Hours:  Monday 9AM-5PM Tuesday 10AM-6PM Wednesday 11AM-7PM Thursday 9AM-5PM Friday              8AM-2PM Saturday Sunday   [facility] Address: [address] [place], [place] [address] Phone: [phone] Hours:  Sunday Closed Monday 10?AM-6?PM Tuesday 9?AM-5?PM Wednesday 9?AM-5?PM Thursday 9?AM-3?PM Friday 9?AM-2?PM Saturday Closed   Safe Medications in Pregnancy   Acne: Benzoyl Peroxide Salicylic Acid  Backache/Headache: Tylenol : 2 regular strength every 4 hours OR              2 Extra strength every 6 hours  Colds/Coughs/Allergies: Benadryl  (alcohol free) 25 mg every 6 hours as needed Breath right strips Claritin Cepacol throat lozenges Chloraseptic throat spray Cold-Eeze- up to three times per day Cough drops, alcohol free Flonase  (by prescription only) Guaifenesin  Mucinex  Robitussin DM (plain only, alcohol free) Saline nasal spray/drops Sudafed (pseudoephedrine) & Actifed ** use only after [redacted] weeks gestation and if you do not have high blood pressure Tylenol  Vicks Vaporub Zinc lozenges Zyrtec   Constipation: Colace Ducolax suppositories Fleet enema Glycerin  suppositories Metamucil Milk of magnesia Miralax Senokot Smooth move tea  Diarrhea: Kaopectate Imodium A-D  *NO pepto Bismol  Hemorrhoids: Anusol Anusol HC Preparation H Tucks  Indigestion: Tums Maalox Mylanta Cimetidine (Tagamet HB)** preferred in pregnancy Famotidine  (Pepcid ) Ranitidine (Zantac)  Insomnia: Benadryl  (alcohol free) 25mg  every 6 hours as needed Tylenol  PM Unisom, no Gelcaps  Leg Cramps: Tums MagGel  Nausea/Vomiting:  Bonine Dramamine Emetrol Ginger extract Sea bands Meclizine   Nausea medication to take during pregnancy:  Unisom (doxylamine succinate 25 mg tablets) Take one  tablet daily at bedtime. If symptoms are not adequately controlled, the dose can be increased to a maximum recommended dose of two tablets daily (1/2 tablet in the morning, 1/2 tablet mid-afternoon and one at bedtime). Vitamin B6 100mg  tablets. Take one tablet twice a day (up to 200 mg per day).  Skin Rashes: Aveeno products Benadryl  cream or 25mg  every 6 hours as needed Calamine Lotion 1% cortisone cream  Yeast infection: Gyne-lotrimin 7 Monistat 7   **If taking multiple medications, please check labels to avoid duplicating the same active ingredients **take medication as directed on the label ** Do not exceed 4000 mg of tylenol  in 24 hours **Do not take medications that contain aspirin or ibuprofen 

## 2024-03-06 NOTE — Progress Notes (Signed)
 Dr. Von into see pt and eval status

## 2024-03-06 NOTE — MAU Note (Signed)
 Tricia Campbell is a 33 y.o. at Unknown here in MAU reporting: she's been vomiting intermittently for the past week.  Had a +HPT.  Reports my doctor said I need an ultrasound.  Denies VB or pain.  LMP: Unsure Onset of complaint: 1 week Pain score: 0 Vitals:   03/06/24 0928  BP: 109/63  Pulse: 82  Resp: 18  Temp: 98.3 F (36.8 C)  SpO2: 99%     FHT: NA  Lab orders placed from triage: None

## 2024-03-09 ENCOUNTER — Other Ambulatory Visit

## 2024-03-09 ENCOUNTER — Other Ambulatory Visit: Payer: Self-pay

## 2024-03-09 ENCOUNTER — Other Ambulatory Visit: Payer: Self-pay | Admitting: Family Medicine

## 2024-03-09 ENCOUNTER — Ambulatory Visit (HOSPITAL_COMMUNITY)
Admission: RE | Admit: 2024-03-09 | Discharge: 2024-03-09 | Disposition: A | Discharge status: Home / Self Care | Source: Ambulatory Visit | Attending: Family Medicine | Admitting: Family Medicine

## 2024-03-09 DIAGNOSIS — O3680X0 Pregnancy with inconclusive fetal viability, not applicable or unspecified: Secondary | ICD-10-CM

## 2024-03-12 ENCOUNTER — Other Ambulatory Visit: Payer: Self-pay

## 2024-03-12 ENCOUNTER — Encounter: Payer: Self-pay | Admitting: General Practice

## 2024-03-12 ENCOUNTER — Ambulatory Visit: Admitting: General Practice

## 2024-03-12 ENCOUNTER — Other Ambulatory Visit (HOSPITAL_COMMUNITY)
Admission: RE | Admit: 2024-03-12 | Discharge: 2024-03-12 | Disposition: A | Discharge status: Home / Self Care | Source: Ambulatory Visit | Attending: Family Medicine | Admitting: Family Medicine

## 2024-03-12 VITALS — BP 123/65 | HR 71 | Ht 66.0 in | Wt 138.0 lb

## 2024-03-12 DIAGNOSIS — N898 Other specified noninflammatory disorders of vagina: Secondary | ICD-10-CM | POA: Diagnosis present

## 2024-03-12 DIAGNOSIS — Z113 Encounter for screening for infections with a predominantly sexual mode of transmission: Secondary | ICD-10-CM

## 2024-03-12 NOTE — Progress Notes (Signed)
 Patient presents to office today reporting thick white discharge with no odor or itching for the past week. Reports she was treated for BV over a month ago and this feels similar. She would also like STD screening. Patient instructed in self swab & specimen collected. Advised results will be back in 24-48 hours and available via mychart.  Elenor DEL RN BSN 03/12/24

## 2024-03-13 ENCOUNTER — Ambulatory Visit: Payer: Self-pay | Admitting: Obstetrics & Gynecology

## 2024-03-13 DIAGNOSIS — B9689 Other specified bacterial agents as the cause of diseases classified elsewhere: Secondary | ICD-10-CM

## 2024-03-13 LAB — CERVICOVAGINAL ANCILLARY ONLY
Bacterial Vaginitis (gardnerella): POSITIVE — AB
Candida Glabrata: NEGATIVE
Candida Vaginitis: NEGATIVE
Chlamydia: NEGATIVE
Comment: NEGATIVE
Comment: NEGATIVE
Comment: NEGATIVE
Comment: NEGATIVE
Comment: NEGATIVE
Comment: NORMAL
Neisseria Gonorrhea: NEGATIVE
Trichomonas: NEGATIVE

## 2024-03-13 LAB — HEPATITIS B SURFACE ANTIGEN: Hepatitis B Surface Ag: NEGATIVE

## 2024-03-13 LAB — HEPATITIS C ANTIBODY: Hep C Virus Ab: NONREACTIVE

## 2024-03-13 LAB — RPR: RPR Ser Ql: NONREACTIVE

## 2024-03-13 LAB — HIV ANTIBODY (ROUTINE TESTING W REFLEX): HIV Screen 4th Generation wRfx: NONREACTIVE

## 2024-03-16 MED ORDER — METRONIDAZOLE 500 MG PO TABS
500.0000 mg | ORAL_TABLET | Freq: Two times a day (BID) | ORAL | 0 refills | Status: AC
Start: 2024-03-16 — End: 2024-03-23

## 2024-03-17 ENCOUNTER — Ambulatory Visit: Admitting: Family Medicine

## 2024-03-18 ENCOUNTER — Ambulatory Visit: Payer: Self-pay | Admitting: Cardiology

## 2024-03-18 DIAGNOSIS — E0590 Thyrotoxicosis, unspecified without thyrotoxic crisis or storm: Secondary | ICD-10-CM | POA: Diagnosis not present

## 2024-03-18 DIAGNOSIS — R55 Syncope and collapse: Secondary | ICD-10-CM

## 2024-03-24 ENCOUNTER — Ambulatory Visit: Payer: Self-pay | Admitting: Family Medicine

## 2024-03-25 ENCOUNTER — Other Ambulatory Visit: Payer: Self-pay | Admitting: *Deleted

## 2024-03-25 MED ORDER — FLUCONAZOLE 150 MG PO TABS: 150.0000 mg | ORAL_TABLET | Freq: Once | ORAL | 3 refills | Status: AC

## 2024-04-17 ENCOUNTER — Other Ambulatory Visit: Payer: Self-pay

## 2024-04-17 ENCOUNTER — Ambulatory Visit
Admission: EM | Admit: 2024-04-17 | Discharge: 2024-04-17 | Disposition: A | Discharge status: Home / Self Care | Attending: Family Medicine | Admitting: Family Medicine

## 2024-04-17 ENCOUNTER — Encounter: Payer: Self-pay | Admitting: *Deleted

## 2024-04-17 DIAGNOSIS — M62838 Other muscle spasm: Secondary | ICD-10-CM | POA: Diagnosis not present

## 2024-04-17 DIAGNOSIS — M62830 Muscle spasm of back: Secondary | ICD-10-CM | POA: Diagnosis not present

## 2024-04-17 DIAGNOSIS — M549 Dorsalgia, unspecified: Secondary | ICD-10-CM | POA: Diagnosis not present

## 2024-04-17 MED ORDER — CYCLOBENZAPRINE HCL 5 MG PO TABS: 5.0000 mg | ORAL_TABLET | Freq: Every evening | ORAL | 0 refills | Status: DC | PRN

## 2024-04-17 MED ORDER — CELECOXIB 200 MG PO CAPS: 200.0000 mg | ORAL_CAPSULE | Freq: Two times a day (BID) | ORAL | 0 refills | Status: DC

## 2024-04-17 NOTE — ED Triage Notes (Signed)
 PT reports back pain with spasms . Pt reports back pain for one week. Pt denies any injury. Pt has taken ibuprofen  800mg  that helped a little bite.

## 2024-04-17 NOTE — ED Provider Notes (Signed)
 [name]  Note:  This document was prepared using Conservation officer, historic buildings and may include unintentional dictation errors.  MRN: [id number] DOB: Nov 29, 1990  Subjective:   Tricia Campbell is a 33 y.o. female presenting for 1 week history of acute onset upper back pain impacting the neck muscles now. Works at [employer], does a lot of standing, fast paced work. Does not do a lot of heavy lifting. Drinks 32 ounces of water daily.  Has used ibuprofen  with some relief.  No fall, trauma, weakness, numbness or tingling, changes to bowel or urinary habits.  No current facility-administered medications for this encounter.  Current Outpatient Medications:    methimazole  (TAPAZOLE ) 10 MG tablet, Take 40 mg by mouth daily., Disp: , Rfl:    ondansetron  (ZOFRAN -ODT) 4 MG disintegrating tablet, Take 1 tablet (4 mg total) by mouth every 8 (eight) hours as needed., Disp: 30 tablet, Rfl: 0   Allergies  Allergen Reactions   Tape Other (See Comments)    Irritates the skin   Latex Rash    Past Medical History:  Diagnosis Date   History of marijuana use 08/17/2016   +UDS at first prenatal visit   Vaginal delivery      Past Surgical History:  Procedure Laterality Date   APPENDECTOMY     LAPAROSCOPIC APPENDECTOMY N/A 07/23/2018   Procedure: APPENDECTOMY LAPAROSCOPIC;  Surgeon: Ethyl Lenis, MD;  Location: [facility];  Service: General;  Laterality: N/A;   WISDOM TOOTH EXTRACTION      Family History  Problem Relation Age of Onset   Asthma Neg Hx    Diabetes Neg Hx    Heart disease Neg Hx    Hypertension Neg Hx    Kidney disease Neg Hx    Stroke Neg Hx     Social History   Tobacco Use   Smoking status: Former    Current packs/day: 0.00    Types: Cigars, Cigarettes    Quit date: 12/05/2015    Years since quitting: 8.3   Smokeless tobacco: Never   Tobacco comments:    Black and milds some days  Vaping Use   Vaping status: Never Used  Substance  Use Topics   Alcohol use: No   Drug use: No    ROS   Objective:   Vitals: BP 100/63   Pulse 83   Temp 98.2 F (36.8 C)   Resp 18   LMP 04/17/2024   SpO2 97%   Physical Exam Constitutional:      General: She is not in acute distress.    Appearance: Normal appearance. She is well-developed. She is not ill-appearing, toxic-appearing or diaphoretic.  HENT:     Head: Normocephalic and atraumatic.     Right Ear: External ear normal.     Left Ear: External ear normal.     Nose: Nose normal.     Mouth/Throat:     Mouth: Mucous membranes are moist.  Eyes:     General: No scleral icterus.       Right eye: No discharge.        Left eye: No discharge.     Extraocular Movements: Extraocular movements intact.  Cardiovascular:     Rate and Rhythm: Normal rate.  Pulmonary:     Effort: Pulmonary effort is normal.  Musculoskeletal:     Comments: Full range of motion throughout.  Strength 5/5 for upper and lower extremities.  Patient ambulates without any assistance at expected pace.  No ecchymosis, swelling,  lacerations or abrasions.  Patient does have paraspinal muscle tenderness along the entire back excluding the midline.  Tenderness worse over the left trapezius and left lumbar paraspinal muscles with associated significant and obvious spasms of said muscles.  Skin:    General: Skin is warm and dry.  Neurological:     General: No focal deficit present.     Mental Status: She is alert and oriented to person, place, and time.     Cranial Nerves: No cranial nerve deficit.     Motor: No weakness.     Coordination: Coordination normal.     Gait: Gait normal.  Psychiatric:        Mood and Affect: Mood normal.        Behavior: Behavior normal.     Assessment and Plan :   PDMP not reviewed this encounter.  1. Upper back pain   2. Trapezius muscle spasm   3. Spasm of back muscles    Deferred imaging.  Discussed back care, recommended follow-up with an orthopedic practice.   For now, can use celecoxib , cyclobenzaprine  for her back pains.  Hydrate more consistently.  Counseled patient on potential for adverse effects with medications prescribed/recommended today, ER and return-to-clinic precautions discussed, patient verbalized understanding.    Christopher Savannah, [place] 04/17/24 8083

## 2024-06-16 ENCOUNTER — Ambulatory Visit

## 2024-06-17 ENCOUNTER — Other Ambulatory Visit (HOSPITAL_COMMUNITY)
Admission: RE | Admit: 2024-06-17 | Discharge: 2024-06-17 | Disposition: A | Discharge status: Home / Self Care | Source: Ambulatory Visit | Attending: Family Medicine | Admitting: Family Medicine

## 2024-06-17 ENCOUNTER — Other Ambulatory Visit: Payer: Self-pay

## 2024-06-17 ENCOUNTER — Ambulatory Visit (INDEPENDENT_AMBULATORY_CARE_PROVIDER_SITE_OTHER)

## 2024-06-17 VITALS — BP 106/68 | HR 94 | Ht 68.0 in | Wt 131.8 lb

## 2024-06-17 DIAGNOSIS — N898 Other specified noninflammatory disorders of vagina: Secondary | ICD-10-CM | POA: Diagnosis present

## 2024-06-17 NOTE — Progress Notes (Signed)
 Tricia Campbell is here today for a self-swab. Patient states she has vaginal discharge and wants a swab. Explained how to obtain a self-swab and patient verbalized understanding. Explained to patient we will be in contact with any abnormal results. Patient has no further questions or concerns.   [place]on, RN 06/17/24

## 2024-06-18 ENCOUNTER — Ambulatory Visit: Payer: Self-pay | Admitting: Obstetrics and Gynecology

## 2024-06-18 DIAGNOSIS — B9689 Other specified bacterial agents as the cause of diseases classified elsewhere: Secondary | ICD-10-CM

## 2024-06-18 DIAGNOSIS — A599 Trichomoniasis, unspecified: Secondary | ICD-10-CM

## 2024-06-18 DIAGNOSIS — B3731 Acute candidiasis of vulva and vagina: Secondary | ICD-10-CM

## 2024-06-18 LAB — CERVICOVAGINAL ANCILLARY ONLY
Bacterial Vaginitis (gardnerella): POSITIVE — AB
Candida Glabrata: NEGATIVE
Candida Vaginitis: NEGATIVE
Chlamydia: NEGATIVE
Comment: NEGATIVE
Comment: NEGATIVE
Comment: NEGATIVE
Comment: NEGATIVE
Comment: NEGATIVE
Comment: NORMAL
Neisseria Gonorrhea: NEGATIVE
Trichomonas: POSITIVE — AB

## 2024-06-18 MED ORDER — METRONIDAZOLE 500 MG PO TABS: 500.0000 mg | ORAL_TABLET | Freq: Two times a day (BID) | ORAL | 0 refills | Status: AC

## 2024-06-18 MED ORDER — FLUCONAZOLE 150 MG PO TABS: 150.0000 mg | ORAL_TABLET | Freq: Once | ORAL | 0 refills | Status: AC

## 2024-07-21 NOTE — Progress Notes (Deleted)
 Cardiology Office Note:    Date:  07/21/2024   ID:  Tricia Campbell, DOB 14-Feb-1991, MRN [id number]  PCP:  Knute Thersia Bitters, FNP  Cardiologist:  Lonni LITTIE Nanas, MD  Electrophysiologist:  None   Referring MD: Knute Thersia Bitters, FNP   No chief complaint on file.   History of Present Illness:    Tricia Campbell is a 33 y.o. female with a hx of hyperthyroidism presents for follow-up.  She was seen for an initial evaluation for syncope on 07/24/2019.  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  Had presented with near syncopal episode and found to have severe hyperthyroidism.  She was started on Tapazole  and propranolol .  Echocardiogram 03/27/2023 showed normal biventricular function, no significant valvular disease.  She was discharged with Zio patch but reports never received.   She reports prior to her admission in 03/2023 she had syncopal episode.  States that she was at the store and started to feel hot and heart was racing.  She then passed out and awoke on the floor.  She was admitted and diagnosed with hyperthyroidism.  She has been following with Dr. Rosario endocrinology.  Reported palpitations and underwent Zio patch 02/2024, which showed no significant arrhythmias.  Since last clinic visit,  she reports she continues to have palpitations.  Can last for minutes.  She denies any syncope.  States that her endocrinologist stopped her methimazole  and propranolol  because hyperthyroidism had resolved.    Past Medical History:  Diagnosis Date   History of marijuana use 08/17/2016   +UDS at first prenatal visit   Vaginal delivery     Past Surgical History:  Procedure Laterality Date   APPENDECTOMY     LAPAROSCOPIC APPENDECTOMY N/A 07/23/2018   Procedure: APPENDECTOMY LAPAROSCOPIC;  Surgeon: Ethyl Lenis, MD;  Location: [facility];  Service: General;  Laterality: N/A;   WISDOM TOOTH EXTRACTION      Current Medications: No outpatient medications have been  marked as taking for the 07/23/24 encounter (Appointment) with Nanas Lonni LITTIE, MD.     Allergies:   Tape and Latex   Social History   Socioeconomic History   Marital status: Single    Spouse name: Not on file   Number of children: Not on file   Years of education: Not on file   Highest education level: Not on file  Occupational History   Not on file  Tobacco Use   Smoking status: Former    Current packs/day: 0.00    Average packs/day: 0.2 packs/day    Types: Cigars, Cigarettes    Quit date: 12/05/2015    Years since quitting: 8.6   Smokeless tobacco: Never   Tobacco comments:    Black and milds some days  Vaping Use   Vaping status: Never Used  Substance and Sexual Activity   Alcohol use: No   Drug use: No   Sexual activity: Not Currently  Other Topics Concern   Not on file  Social History Narrative   Not on file   Social Drivers of Health   Tobacco Use: Medium Risk (06/17/2024)   Patient History    Smoking Tobacco Use: Former    Smokeless Tobacco Use: Never    Passive Exposure: Not on Actuary Strain: Not on file  Food Insecurity: No Food Insecurity (03/26/2023)   Hunger Vital Sign    Worried About Running Out of Food in the Last Year: Never true    Ran Out of Food in the Last Year: Never true  Transportation Needs: No Transportation Needs (03/26/2023)   PRAPARE - Administrator, Civil Service (Medical): No    Lack of Transportation (Non-Medical): No  Physical Activity: Not on file  Stress: Not on file  Social Connections: Not on file  Depression (PHQ2-9): Medium Risk (03/25/2023)   Depression (PHQ2-9)    PHQ-2 Score: 6  Alcohol Screen: Not on file  Housing: Low Risk (03/26/2023)   Housing    Last Housing Risk Score: 0  Utilities: Not At Risk (03/26/2023)   AHC Utilities    Threatened with loss of utilities: No  Health Literacy: Not on file     Family History: The patient's family history is negative for Asthma,  Diabetes, Heart disease, Hypertension, Kidney disease, and Stroke.  ROS:   Please see the history of present illness.     All other systems reviewed and are negative.  EKGs/Labs/Other Studies Reviewed:    The following studies were reviewed today:   EKG:   07/24/23: Sinus tachycardia, rate 103, no ST abnormal 01/27/2024: Normal sinus rhythm, rate 97, no ST abnormalities  Recent Labs: 01/27/2024: TSH <0.005  Recent Lipid Panel    Component Value Date/Time   CHOL 78 (L) 03/25/2023 1610   TRIG 71 03/25/2023 1610   HDL 52 03/25/2023 1610   CHOLHDL 1.5 03/25/2023 1610   LDLCALC 10 03/25/2023 1610    Physical Exam:    VS:  There were no vitals taken for this visit.    Wt Readings from Last 3 Encounters:  06/17/24 131 lb 12.8 oz (59.8 kg)  03/12/24 138 lb (62.6 kg)  03/06/24 136 lb 11.2 oz (62 kg)     GEN:  Well nourished, well developed in no acute distress HEENT: Normal NECK: No JVD; No carotid bruits LYMPHATICS: No lymphadenopathy CARDIAC: RRR, no murmurs, rubs, gallops RESPIRATORY:  Clear to auscultation without rales, wheezing or rhonchi  ABDOMEN: Soft, non-tender, non-distended MUSCULOSKELETAL:  No edema; No deformity  SKIN: Warm and dry NEUROLOGIC:  Alert and oriented x 3 PSYCHIATRIC:  Normal affect   ASSESSMENT:    No diagnosis found.   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  Had presented with syncopal episode and found to have severe hyperthyroidism.  She was started on methimazole  and propranolol .  Echocardiogram 03/27/2023 showed normal biventricular function, no significant valvular disease.  She was discharged with Zio patch but was not turned in. -Suspect syncopal episode was related to hyperthyroidism.  She did report continued palpitations however.  Zio patch 02/2024 showed no significant arrhythmias  Hyperthyroidism: Follows with endocrinology***  Marijuana use: Cessation recommended  RTC in 6  months***   Medication Adjustments/Labs and Tests Ordered: Current medicines are reviewed at length with the patient today.  Concerns regarding medicines are outlined above.  No orders of the defined types were placed in this encounter.  No orders of the defined types were placed in this encounter.   There are no Patient Instructions on file for this visit.   Signed, Lonni LITTIE Nanas, MD  07/21/2024 7:17 PM    [facility]

## 2024-07-23 ENCOUNTER — Ambulatory Visit: Attending: Cardiology | Admitting: Cardiology

## 2024-08-24 ENCOUNTER — Other Ambulatory Visit: Payer: Self-pay

## 2024-08-24 ENCOUNTER — Other Ambulatory Visit (HOSPITAL_COMMUNITY)
Admission: RE | Admit: 2024-08-24 | Discharge: 2024-08-24 | Disposition: A | Discharge status: Home / Self Care | Source: Ambulatory Visit | Attending: Family Medicine | Admitting: Family Medicine

## 2024-08-24 ENCOUNTER — Ambulatory Visit: Payer: Self-pay | Admitting: *Deleted

## 2024-08-24 VITALS — BP 122/79 | HR 87 | Ht 68.0 in | Wt 136.3 lb

## 2024-08-24 DIAGNOSIS — N898 Other specified noninflammatory disorders of vagina: Secondary | ICD-10-CM | POA: Diagnosis present

## 2024-08-24 DIAGNOSIS — Z3042 Encounter for surveillance of injectable contraceptive: Secondary | ICD-10-CM

## 2024-08-24 LAB — POCT PREGNANCY, URINE: Preg Test, Ur: NEGATIVE

## 2024-08-24 MED ORDER — MEDROXYPROGESTERONE ACETATE 150 MG/ML IM SUSY
150.0000 mg | PREFILLED_SYRINGE | Freq: Once | INTRAMUSCULAR | Status: AC
  Administered 2024-08-24: 150 mg via INTRAMUSCULAR

## 2024-08-24 NOTE — Progress Notes (Signed)
 Here to restart depoprovera and wants to do self swab. Last unprotected intercourse 3 weeks ago UPT negative. Per protocol depo-provera  injection given without complaint. Advised to use back up method for 2 weeks. Advised to schedule annual exam after 10/15/24.  C/o vaginal discharge and request full wet prep. Self swab obtained and explained will be notified if result positive and needs treatment. She voices understanding. Sent to front desk to schedule appointments.  Rock Skip PEAK

## 2024-08-25 ENCOUNTER — Ambulatory Visit: Payer: Self-pay | Admitting: Obstetrics and Gynecology

## 2024-08-25 LAB — CERVICOVAGINAL ANCILLARY ONLY
Bacterial Vaginitis (gardnerella): NEGATIVE
Candida Glabrata: NEGATIVE
Candida Vaginitis: NEGATIVE
Chlamydia: NEGATIVE
Comment: NEGATIVE
Comment: NEGATIVE
Comment: NEGATIVE
Comment: NEGATIVE
Comment: NEGATIVE
Comment: NORMAL
Neisseria Gonorrhea: NEGATIVE
Trichomonas: NEGATIVE

## 2024-11-09 ENCOUNTER — Ambulatory Visit: Payer: Self-pay
